# Patient Record
Sex: Female | Born: 1985 | Race: Black or African American | Hispanic: No | Marital: Single | Smoking: Current every day smoker
Health system: Southern US, Community
[De-identification: ages and names within clinical notes are randomized; demographics above are authoritative.]

## PROBLEM LIST (undated history)

## (undated) ENCOUNTER — Inpatient Hospital Stay (HOSPITAL_COMMUNITY): Payer: Self-pay

## (undated) DIAGNOSIS — G5601 Carpal tunnel syndrome, right upper limb: Secondary | ICD-10-CM

## (undated) DIAGNOSIS — I10 Essential (primary) hypertension: Secondary | ICD-10-CM

## (undated) DIAGNOSIS — E119 Type 2 diabetes mellitus without complications: Secondary | ICD-10-CM

## (undated) DIAGNOSIS — E282 Polycystic ovarian syndrome: Secondary | ICD-10-CM

## (undated) HISTORY — PX: NO PAST SURGERIES: SHX2092

## (undated) HISTORY — DX: Polycystic ovarian syndrome: E28.2

---

## 2009-06-27 LAB — CONVERTED CEMR LAB

## 2010-01-04 ENCOUNTER — Emergency Department (HOSPITAL_COMMUNITY): Admission: EM | Admit: 2010-01-04 | Discharge: 2010-01-04 | Payer: Self-pay | Admitting: Emergency Medicine

## 2010-02-22 ENCOUNTER — Ambulatory Visit: Payer: Self-pay | Admitting: Internal Medicine

## 2010-02-22 DIAGNOSIS — G43909 Migraine, unspecified, not intractable, without status migrainosus: Secondary | ICD-10-CM | POA: Insufficient documentation

## 2010-02-22 DIAGNOSIS — I10 Essential (primary) hypertension: Secondary | ICD-10-CM | POA: Insufficient documentation

## 2010-02-22 DIAGNOSIS — G8929 Other chronic pain: Secondary | ICD-10-CM | POA: Insufficient documentation

## 2010-02-22 DIAGNOSIS — J410 Simple chronic bronchitis: Secondary | ICD-10-CM | POA: Insufficient documentation

## 2010-02-22 DIAGNOSIS — M549 Dorsalgia, unspecified: Secondary | ICD-10-CM

## 2010-02-23 LAB — CONVERTED CEMR LAB
ALT: 21 units/L (ref 0–35)
AST: 22 units/L (ref 0–37)
Albumin: 3.5 g/dL (ref 3.5–5.2)
Alkaline Phosphatase: 44 units/L (ref 39–117)
BUN: 13 mg/dL (ref 6–23)
Basophils Absolute: 0 10*3/uL (ref 0.0–0.1)
Basophils Relative: 0.3 % (ref 0.0–3.0)
Bilirubin Urine: NEGATIVE
Bilirubin, Direct: 0.1 mg/dL (ref 0.0–0.3)
CO2: 25 meq/L (ref 19–32)
Calcium: 8.9 mg/dL (ref 8.4–10.5)
Chloride: 108 meq/L (ref 96–112)
Cholesterol: 105 mg/dL (ref 0–200)
Creatinine, Ser: 0.7 mg/dL (ref 0.4–1.2)
Eosinophils Absolute: 0.1 10*3/uL (ref 0.0–0.7)
Eosinophils Relative: 1.6 % (ref 0.0–5.0)
GFR calc non Af Amer: 126.53 mL/min (ref >60)
Glucose, Bld: 78 mg/dL (ref 70–99)
HCT: 39 % (ref 36.0–46.0)
HDL: 42.6 mg/dL (ref >39.00)
Hemoglobin, Urine: NEGATIVE
Hemoglobin: 13.7 g/dL (ref 12.0–15.0)
Ketones, ur: NEGATIVE mg/dL
LDL Cholesterol: 47 mg/dL (ref 0–99)
Lymphocytes Relative: 23.6 % (ref 12.0–46.0)
Lymphs Abs: 1.5 10*3/uL (ref 0.7–4.0)
MCHC: 35.2 g/dL (ref 30.0–36.0)
MCV: 91.6 fL (ref 78.0–100.0)
Monocytes Absolute: 0.3 10*3/uL (ref 0.1–1.0)
Monocytes Relative: 5.1 % (ref 3.0–12.0)
Neutro Abs: 4.5 10*3/uL (ref 1.4–7.7)
Neutrophils Relative %: 69.4 % (ref 43.0–77.0)
Nitrite: NEGATIVE
Platelets: 238 10*3/uL (ref 150.0–400.0)
Potassium: 4.3 meq/L (ref 3.5–5.1)
RBC: 4.25 M/uL (ref 3.87–5.11)
RDW: 12.9 % (ref 11.5–14.6)
Sodium: 139 meq/L (ref 135–145)
Specific Gravity, Urine: 1.025 (ref 1.000–1.030)
TSH: 1.71 microintl units/mL (ref 0.35–5.50)
Total Bilirubin: 0.4 mg/dL (ref 0.3–1.2)
Total CHOL/HDL Ratio: 2
Total Protein, Urine: NEGATIVE mg/dL
Total Protein: 6.9 g/dL (ref 6.0–8.3)
Triglycerides: 79 mg/dL (ref 0.0–149.0)
Urine Glucose: NEGATIVE mg/dL
Urobilinogen, UA: 0.2 (ref 0.0–1.0)
VLDL: 15.8 mg/dL (ref 0.0–40.0)
WBC: 6.4 10*3/uL (ref 4.5–10.5)
pH: 6 (ref 5.0–8.0)

## 2010-03-01 ENCOUNTER — Encounter: Admission: RE | Admit: 2010-03-01 | Discharge: 2010-03-01 | Payer: Self-pay | Admitting: Internal Medicine

## 2010-05-29 ENCOUNTER — Ambulatory Visit
Admission: RE | Admit: 2010-05-29 | Discharge: 2010-05-29 | Payer: Self-pay | Source: Home / Self Care | Attending: Internal Medicine | Admitting: Internal Medicine

## 2010-05-29 DIAGNOSIS — G4700 Insomnia, unspecified: Secondary | ICD-10-CM | POA: Insufficient documentation

## 2010-06-26 NOTE — Assessment & Plan Note (Signed)
Summary: NEW [employer] PT--#--PKG---STC   Vital Signs:  Patient profile:   25 year old female Height:      61.5 inches (156.21 cm) Weight:      215.12 pounds (97.78 kg) BMI:     40.13 O2 Sat:      97 % on Room air Temp:     98.7 degrees F (37.06 degrees C) oral Pulse rate:   73 / minute BP sitting:   122 / 82  (left arm) Cuff size:   large  Vitals Entered By: Orlan Leavens RMA (February 22, 2010 8:07 AM)  O2 Flow:  Room air CC: New patient Is Patient Diabetic? No Pain Assessment Patient in pain? no        Primary Care Provider:  Newt Lukes MD  CC:  New patient.  History of Present Illness: new pt to me and our practice, here to est care - also patient is here today for annual physical. Patient feels well overall today, fasting -  c/o occassional RLQ pain - episodic pains - occur every 2-4 Anesia Blackwell, lasts 5-79min per episode - radiates from RLQ/pelvic area to LLQ - not a/w bowel movement or meals - no fever - no change in menses - last BCP change 06/2009 no n/v or change in BMs - formed BM 2-4/day (usual) no weight loss, + weight gain over past 2 years  also c/o back spasm - worse in bed when turning over in sleep - also exac by prolonged standing or twisting to reach behind no injury or trauma -   Preventive Screening-Counseling & Management  Alcohol-Tobacco     Alcohol drinks/day: <1     Alcohol Counseling: not indicated; use of alcohol is not excessive or problematic     Smoking Status: current     Smoking Cessation Counseling: yes     Cigars/week: black and mild     Tobacco Counseling: to quit use of tobacco products  Caffeine-Diet-Exercise     Diet Counseling: to improve diet; diet is suboptimal     Does Patient Exercise: no     Exercise Counseling: to improve exercise regimen     Depression Counseling: not indicated; screening negative for depression  Safety-Violence-Falls     Seat Belt Counseling: not indicated; patient wears seat belts  Helmet Counseling: not applicable     Firearm Counseling: not applicable     Violence Counseling: not indicated; no violence risk noted     Fall Risk Counseling: not indicated; no significant falls noted  Clinical Review Panels:  Prevention   Last Pap Smear:  Interpretation Result:Negative for intraepithelial Lesion or Malignancy.    (06/27/2009)  Immunizations   Last Tetanus Booster:  Historical (05/27/2006)   Current Medications (verified): 1)  Tri-Norinyl (28) 0.5/1/0.5-35 Mg-Mcg Tabs (Norethin-Eth Estrad Triphasic) .... Take 1 By Mouth Once Daily 2)  Thermo-Bond (Ovc) .... Take As Needed 3)  Cell Activator (Ovc) .... Take 1 Three Times A Day 4)  Multivitamins  Tabs (Multiple Vitamin) .... Take 1 Three Times A Day  Allergies (verified): No Known Drug Allergies  Past History:  Past Medical History: Depression, mild - never on med tx Hypertension, never on med tx PCOS, never on med tx carpal tunnel syndome, R>L  MD roster: gyn - health dept  Past Surgical History: Denies surgical history  Family History: Family History of Alcoholism/Addiction (mom) Family History Diabetes 1st degree relative (other realtive) Family History Hypertension (other relative) Stroke (grandparent)  Social History: Current Smoker - black and mild  cigars social alcoholuse born [place], raised by foster family in [place] in [place] since 2007 - undergrad at [employer],  now grad school student - MSW single, lives wityh boyfirned and roommate Smoking Status:  current Does Patient Exercise:  no  Review of Systems  The patient denies anorexia, fever, weight loss, decreased hearing, hoarseness, chest pain, syncope, dyspnea on exertion, peripheral edema, hemoptysis, melena, hematochezia, severe indigestion/heartburn, muscle weakness, suspicious skin lesions, difficulty walking, abnormal bleeding, and enlarged lymph nodes.         also, see HPI above. I have reviewed all other systems and they  were negative.   Physical Exam  General:  overweight-appearing.  alert, well-developed, well-nourished, and cooperative to examination.    Head:  Normocephalic and atraumatic without obvious abnormalities. No apparent alopecia or balding. Eyes:  vision grossly intact; pupils equal, round and reactive to light.  conjunctiva and lids normal.    Ears:  normal pinnae bilaterally, without erythema, swelling, or tenderness to palpation. TMs clear, without effusion, or cerumen impaction. Hearing grossly normal bilaterally  Mouth:  teeth and gums in good repair; mucous membranes moist, without lesions or ulcers. oropharynx clear without exudate, no erythema.  Neck:  thick, supple, full ROM, no masses, no thyromegaly; no thyroid nodules or tenderness. no JVD or carotid bruits.   Lungs:  normal respiratory effort, no intercostal retractions or use of accessory muscles; normal breath sounds bilaterally - no crackles and no wheezes.    Heart:  normal rate, regular rhythm, no murmur, and no rub. BLE without edema. normal DP pulses and normal cap refill in all 4 extremities    Abdomen:  protuberant, soft, non-tender, normal bowel sounds, no distention; no masses and no appreciable hepatomegaly or splenomegaly.   Genitalia:  defer to gyn Msk:  No deformity or scoliosis noted of thoracic or lumbar spine.  +myofascial spasm lumbar paraspnal region r>l Neurologic:  alert & oriented X3 and cranial nerves II-XII symetrically intact.  strength normal in all extremities, sensation intact to light touch, and gait normal. speech fluent without dysarthria or aphasia; follows commands with good comprehension.  Skin:  no rashes, vesicles, ulcers, or erythema. No nodules or irregularity to palpation.  Cervical Nodes:  No lymphadenopathy noted Axillary Nodes:  No palpable lymphadenopathy Inguinal Nodes:  No significant adenopathy Psych:  Oriented X3, memory intact for recent and remote, normally interactive, good eye  contact, not anxious appearing, not depressed appearing, and not agitated.      Impression & Recommendations:  Problem # 1:  PREVENTIVE HEALTH CARE (ICD-V70.0) Patient has been counseled on age-appropriate routine health concerns for screening and prevention. These are reviewed and up-to-date. Immunizations are up-to-date or declined. Labs ordered and will be reviewed.  Orders: TLB-BMP (Basic Metabolic Panel-BMET) (80048-METABOL) TLB-CBC Platelet - w/Differential (85025-CBCD) TLB-Hepatic/Liver Function Pnl (80076-HEPATIC) TLB-Lipid Panel (80061-LIPID) TLB-TSH (Thyroid Stimulating Hormone) (84443-TSH) TLB-Udip w/ Micro (81001-URINE)  Problem # 2:  MUSCLE SPASM, BACK (ICD-724.8)  suspect spasm causing positional pain - muscle relaxants erx done - weight loss and exercise rec - consider refer to PT Her updated medication list for this problem includes:    Robaxin 500 Mg Tabs (Methocarbamol) .Marland Kitchen... 1 by mouth at bedtime and every 8 hours as needed for muscle spasm and pain  Discussed use of moist heat or ice, modified activities, medications, and stretching/strengthening exercises. Back care instructions given. To be seen in 2 Ivanell Deshotel if no improvement; sooner if worsening of symptoms.   Orders: Prescription Created Electronically [phone])  Problem # 3:  ABDOMINAL PAIN, RIGHT LOWER QUADRANT (ICD-789.03)  exam benign - ?related to ovarian problem - check pelvic US - f/u here after complete abnd labs reviewed - no abn on exam or GI hx of concern Orders: Radiology Referral (Radiology)  Discussed symptom control with the patient.   Problem # 4:  OVERWEIGHT (ICD-278.02)  Ht: 61.5 (02/22/2010)   Wt: 215.12 (02/22/2010)   BMI: 40.13 (02/22/2010)  Problem # 5:  SMOKER (ICD-305.1) 5 minutes today spent on patient education regarding the unhealthy effects of continued tobacco abuse and encouragment of cessation including medical options available to help patient to quit smoking.   Complete  Medication List: 1)  Tri-norinyl (28) 0.5/1/0.5-35 Mg-mcg Tabs (Norethin-eth estrad triphasic) .... Take 1 by mouth once daily 2)  Thermo-bond (ovc)  .... Take as needed 3)  Cell Activator (ovc)  .... Take 1 three times a day 4)  Multivitamins Tabs (Multiple vitamin) .... Take 1 three times a day 5)  Robaxin 500 Mg Tabs (Methocarbamol) .Marland Kitchen.. 1 by mouth at bedtime and every 8 hours as needed for muscle spasm and pain  Patient Instructions: 1)  it was good to see you today. 2)  exam appears normal today - 3)  it is important that you work on losing weight - monitor your diet and consume fewer calories such as less carbohydrates (sugar) and less fat. you also need to increase your physical activity level - start by walking for 10-20 minutes 3 times per week and work up to 30 minutes 4-5 times each week.  4)  test(s) ordered today - your results will be posted on the phone tree for review in 48-72 hours from the time of test completion; call [phone] and enter your 9 digit MRN (listed above on this page, just below your name); if any changes need to be made or there are abnormal results, you will be contacted directly. 5)  we'll make referral for pelvic ultrasound to look for causes of lower abdominal and pelvic pain. Our office will contact you regarding this appointment once made.  6)  use robaxin for muscle spasm and pain as dicsussed - at bedtime and every 8 hours as needed  - your prescription has been electronically submitted to your pharmacy. Please take as directed. Contact our office if you believe you're having problems with the medication(s).  7)  Please schedule a follow-up appointment in 2-4 Belynda Pagaduan to review results and symptoms, call sooner if problems.  8)  Tobacco is very bad for your health and your loved ones! You Should stop smoking! Prescriptions: ROBAXIN 500 MG TABS (METHOCARBAMOL) 1 by mouth at bedtime and every 8 hours as needed for muscle spasm and pain  #40 x 0   Entered and  Authorized by:   Newt Lukes MD   Signed by:   Newt Lukes MD on 02/22/2010   Method used:   Electronically to        [facility]* (retail)       [address] [place], [place]  [address]       Ph: [phone]       Fax: [phone]   RxID:   [id number]    Pap Smear  Procedure date:  06/27/2009  Findings:      Interpretation Result:Negative for intraepithelial Lesion or Malignancy.       Immunization History:  Tetanus/Td Immunization History:    Tetanus/Td:  historical (  05/27/2006)  

## 2010-06-28 NOTE — Assessment & Plan Note (Signed)
Summary: cant sleep/ NEEDS FOR WORK/ PAPERWORK/ TB INJ /NWS  #   Vital Signs:  Patient profile:   25 year old female Height:      61.5 inches (156.21 cm) Weight:      219.12 pounds (99.60 kg) O2 Sat:      98 % on Room air Temp:     98.7 degrees F (37.06 degrees C) oral Pulse rate:   95 / minute BP sitting:   128 / 82  (left arm) Cuff size:   large  Vitals Entered By: Orlan Leavens RMA (May 29, 2010 10:49 AM)  O2 Flow:  Room air CC: paperwork, can't sleep Is Patient Diabetic? No Pain Assessment Patient in pain? no      Comments Pt had CPX doen back in Sept, Requesting form to be fill-out, also needing TB skin test, cant sleep   Primary Care Provider:  Newt Lukes MD  CC:  paperwork and can't sleep.  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 (to do home visits for at risk/ viloent kids)- need for PPD to documetn same-  c/o insomnia acute on chronic issue worse in past 6 weeks requests something to help sleep -  otc meds ineffective, prev used ambein as teenager with good results denies assoc depression symptoms   Preventive Screening-Counseling & Management  Alcohol-Tobacco     Alcohol drinks/day: <1     Alcohol Counseling: not indicated; use of alcohol is not excessive or problematic     Smoking Status: current     Smoking Cessation Counseling: yes     Cigars/week: black and mild     Tobacco Counseling: to quit use of tobacco products  Caffeine-Diet-Exercise     Diet Counseling: to improve diet; diet is suboptimal     Does Patient Exercise: no     Exercise Counseling: to improve exercise regimen     Depression Counseling: not indicated; screening negative for depression  Safety-Violence-Falls     Seat Belt Counseling: not indicated; patient wears seat belts     Helmet Counseling: not applicable     Firearm Counseling: not applicable     Violence Counseling: not  indicated; no violence risk noted     Fall Risk Counseling: not indicated; no significant falls noted  Clinical Review Panels:  Prevention   Last Pap Smear:  Interpretation Result:Negative for intraepithelial Lesion or Malignancy.    (06/27/2009)  Immunizations   Last Tetanus Booster:  Historical (05/27/2006)   Last PPD Date Read:  05/31/2010 (05/29/2010)   Last PPD Result:  negative (05/29/2010)  Lipid Management   Cholesterol:  105 (02/22/2010)   LDL (bad choesterol):  47 (02/22/2010)   HDL (good cholesterol):  42.60 (02/22/2010)  CBC   WBC:  6.4 (02/22/2010)   RBC:  4.25 (02/22/2010)   Hgb:  13.7 (02/22/2010)   Hct:  39.0 (02/22/2010)   Platelets:  238.0 (02/22/2010)   MCV  91.6 (02/22/2010)   MCHC  35.2 (02/22/2010)   RDW  12.9 (02/22/2010)   PMN:  69.4 (02/22/2010)   Lymphs:  23.6 (02/22/2010)   Monos:  5.1 (02/22/2010)   Eosinophils:  1.6 (02/22/2010)   Basophil:  0.3 (02/22/2010)  Complete Metabolic Panel   Glucose:  78 (02/22/2010)   Sodium:  139 (02/22/2010)   Potassium:  4.3 (02/22/2010)   Chloride:  108 (02/22/2010)   CO2:  25 (02/22/2010)   BUN:  13 (02/22/2010)  Creatinine:  0.7 (02/22/2010)   Albumin:  3.5 (02/22/2010)   Total Protein:  6.9 (02/22/2010)   Calcium:  8.9 (02/22/2010)   Total Bili:  0.4 (02/22/2010)   Alk Phos:  44 (02/22/2010)   SGPT (ALT):  21 (02/22/2010)   SGOT (AST):  22 (02/22/2010)   Current Medications (verified): 1)  Tri-Norinyl (28) 0.5/1/0.5-35 Mg-Mcg Tabs (Norethin-Eth Estrad Triphasic) .... Take 1 By Mouth Once Daily 2)  Thermo-Bond (Ovc) .... Take As Needed 3)  Cell Activator (Ovc) .... Take 1 Three Times A Day 4)  Multivitamins  Tabs (Multiple Vitamin) .... Take 1 Three Times A Day 5)  Robaxin 500 Mg Tabs (Methocarbamol) .Marland Kitchen.. 1 By Mouth At Bedtime and Every 8 Hours As Needed For Muscle Spasm and Pain  Allergies (verified): No Known Drug Allergies  Past History:  Past medical, surgical, family and social  histories (including risk factors) reviewed, and no changes noted (except as noted below).  Past Medical History: Reviewed history from 02/22/2010 and no changes required. Depression, mild - never on med tx Hypertension, never on med tx PCOS, never on med tx carpal tunnel syndome, R>L  MD roster: gyn - health dept  Past Surgical History: Reviewed history from 02/22/2010 and no changes required. Denies surgical history  Family History: Reviewed history from 02/22/2010 and no changes required. Family History of Alcoholism/Addiction (mom) Family History Diabetes 1st degree relative (other realtive) Family History Hypertension (other relative) Stroke (grandparent)  Social History: Reviewed history from 02/22/2010 and no changes required. 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 I have reviewed all other systems and they were negative.   Physical Exam  General:  overweight-appearing.  alert, well-developed, well-nourished, and cooperative to examination.    Head:  Normocephalic and atraumatic without obvious abnormalities. No apparent alopecia or balding. Eyes:  vision grossly intact; pupils equal, round and reactive to light.  conjunctiva and lids normal.    Ears:  normal pinnae bilaterally, without erythema, swelling, or tenderness to palpation. TMs clear, without effusion, or cerumen impaction. Hearing grossly normal bilaterally  Mouth:  teeth and gums in good repair; mucous membranes moist, without lesions or ulcers. oropharynx clear without exudate, no erythema.  Neck:  thick, supple, full ROM, no masses, no thyromegaly; no thyroid nodules or tenderness. no JVD or carotid bruits.   Lungs:  normal respiratory effort, no intercostal retractions or use of  accessory muscles; normal breath sounds bilaterally - no crackles and no wheezes.    Heart:  normal rate, regular rhythm, no murmur, and no rub. BLE without edema. normal DP pulses and normal cap refill in all 4 extremities    Abdomen:  protuberant, soft, non-tender, normal bowel sounds, no distention; no masses and no appreciable hepatomegaly or splenomegaly.   Genitalia:  defer to gyn Msk:  No deformity or scoliosis noted of thoracic or lumbar spine.  Neurologic:  alert & oriented X3 and cranial nerves II-XII symetrically intact.  strength normal in all extremities, sensation intact to light touch, and gait normal. speech fluent without dysarthria or aphasia; follows commands with good comprehension.  Skin:  no rashes, vesicles, ulcers, or erythema. No nodules or irregularity to palpation.  Psych:  Oriented X3, memory intact for recent and remote, normally interactive, good eye  contact, not anxious appearing, not depressed appearing, and not agitated.      Impression & Recommendations:  Problem # 1:  OTH GENERAL MEDICAL EXAMINATION ADMIN PURPOSES (ICD-V70.3) Patient has been counseled on age-appropriate routine health concerns for screening and prevention. These are reviewed and up-to-date. Immunizations are up-to-date or declined. Labs 01/2010 reviewed. PPD today - form completed for pt to work with SW home programs as part of education process  Problem # 2:  INSOMNIA (ICD-780.52) rxd done today - advised to avoid nightly consistent use - only as needed for best effect - pt understands and agrees to same Her updated medication list for this problem includes:    Zolpidem Tartrate 10 Mg Tabs (Zolpidem tartrate) .Marland Kitchen... 1/2-1 by mouth at bedtime as needed for sleep  Complete Medication List: 1)  Tri-norinyl (28) 0.5/1/0.5-35 Mg-mcg Tabs (Norethin-eth estrad triphasic) .... Take 1 by mouth once daily 2)  Thermo-bond (ovc)  .... Take as needed 3)  Cell Activator (ovc)  .... Take 1 three times a  day 4)  Multivitamins Tabs (Multiple vitamin) .... Take 1 three times a day 5)  Robaxin 500 Mg Tabs (Methocarbamol) .Marland Kitchen.. 1 by mouth at bedtime and every 8 hours as needed for muscle spasm and pain 6)  Zolpidem Tartrate 10 Mg Tabs (Zolpidem tartrate) .... 1/2-1 by mouth at bedtime as needed for sleep  Other Orders: TB Skin Test [phone]) Admin 1st Vaccine (98119)  Patient Instructions: 1)  it was good to see you today.  2)  return for PPD skin reading by nurse on Wed 05/31/10 3)  start generic ambien as needed for sleep - your prescription has been faxed to your pharmacy. Please take as directed. Contact our office if you believe you're having problems with the medication(s).  4)  exam  normal today - paperwork completed as requested and will be returned to you after skin reading Wed 05/31/10 5)  it is important that you continue to work on losing weight - monitor your diet and consume fewer calories such as less carbohydrates (sugar) and less fat. you also need to increase your physical activity level - start by walking for 10-20 minutes 3 times per week and work up to 30 minutes 4-5 times each week.  6)  use robaxin for muscle spasm and pain as dicsussed - at bedtime and every 8 hours as needed  - your prescription has been electronically submitted to your pharmacy. Please take as directed. Contact our office if you believe you're having problems with the medication(s).  7)  Tobacco is very bad for your health and your loved ones! You Should stop smoking! 8)  Please schedule a follow-up appointment as needed. Prescriptions: ZOLPIDEM TARTRATE 10 MG TABS (ZOLPIDEM TARTRATE) 1/2-1 by mouth at bedtime as needed for sleep  #30 x 1   Entered and Authorized by:   Newt Lukes MD   Signed by:   Newt Lukes MD on 05/29/2010   Method used:   Printed then faxed to ...       [facility]* (retail)       [address] [place], [place]  [address]       Ph:  [phone]       Fax: [phone]   RxID:   [id number]    Orders Added: 1)  Est. Patient Level IV [64403] 2)  TB Skin Test [86580] 3)  Admin 1st Vaccine [  90471]   Immunizations Administered:  PPD Skin Test:    Vaccine Type: PPD    Site: left forearm    Mfr: Sanofi Pasteur    Dose: 0.1 ml    Route: ID    Given by: Orlan Leavens RMA    Exp. Date: 10/22/2011    Lot #: [id number]  PPD Results    Date of reading: 05/31/2010    Results: < 5mm    Interpretation: negative   Immunizations Administered:  PPD Skin Test:    Vaccine Type: PPD    Site: left forearm    Mfr: Sanofi Pasteur    Dose: 0.1 ml    Route: ID    Given by: Orlan Leavens RMA    Exp. Date: 10/22/2011    Lot #: [id number]

## 2011-06-06 ENCOUNTER — Telehealth: Payer: Self-pay | Admitting: Internal Medicine

## 2011-06-06 NOTE — Telephone Encounter (Signed)
I do not have clinic this afternoon and did not see the message for now. Please offer her an appointment for tomorrow. Thanks

## 2011-06-06 NOTE — Telephone Encounter (Signed)
The pt called and is hoping a same day appt for cold symptoms.  Can she be worked in?   Thanks!

## 2011-06-07 ENCOUNTER — Encounter: Payer: Self-pay | Admitting: Internal Medicine

## 2011-06-07 ENCOUNTER — Ambulatory Visit (INDEPENDENT_AMBULATORY_CARE_PROVIDER_SITE_OTHER): Payer: BC Managed Care – PPO | Admitting: Internal Medicine

## 2011-06-07 VITALS — BP 110/72 | HR 80 | Temp 99.1°F | Ht 64.0 in | Wt 223.2 lb

## 2011-06-07 DIAGNOSIS — F17200 Nicotine dependence, unspecified, uncomplicated: Secondary | ICD-10-CM

## 2011-06-07 DIAGNOSIS — J069 Acute upper respiratory infection, unspecified: Secondary | ICD-10-CM

## 2011-06-07 DIAGNOSIS — J209 Acute bronchitis, unspecified: Secondary | ICD-10-CM

## 2011-06-07 DIAGNOSIS — G4700 Insomnia, unspecified: Secondary | ICD-10-CM

## 2011-06-07 MED ORDER — AMOXICILLIN-POT CLAVULANATE 875-125 MG PO TABS: 1.0000 | ORAL_TABLET | Freq: Two times a day (BID) | ORAL | Status: AC

## 2011-06-07 MED ORDER — ZOLPIDEM TARTRATE 5 MG PO TABS: 5.0000 mg | ORAL_TABLET | Freq: Every evening | ORAL | Status: DC | PRN

## 2011-06-07 NOTE — Assessment & Plan Note (Signed)
Uses generic Ambien as needed - refill provided today

## 2011-06-07 NOTE — Patient Instructions (Signed)
It was good to see you today. Augmentin antibiotics - Your prescription(s) have been submitted to your pharmacy. Please take as directed and contact our office if you believe you are having problem(s) with the medication(s). Alternate between ibuprofen and tylenol for aches, pain and fever symptoms as discussed Continue Mucinex as ongoing, warm salt water gargles for throat and consider nasal saline irrigation if needed Keep hydrated, rest and call us if symptoms worse or unimproved Refill on the generic Ambien provided as requested Consider smoking cessation as we discussed. Smoking is very bad for your health!

## 2011-06-07 NOTE — Progress Notes (Signed)
  Subjective:   HPI  complains of cold symptoms  Onset >1 week ago, wax/wane symptoms  associated with sore throat, fatigue, rhinorrhea, mild headache and low grade fever Progression to myalgias, sinus pressure and mild-mod chest congestion No relief with OTC meds Precipitated by sick contacts  Past Medical History  Diagnosis Date  . Depression     mild, never on med tx  . PCOS (polycystic ovarian syndrome)     never on med  . CTS (carpal tunnel syndrome)     Right and left    Review of Systems Constitutional: No night sweats, no unexpected weight change Pulmonary: No pleurisy or hemoptysis Cardiovascular: No chest pain or palpitations     Objective:   Physical Exam BP 110/72  Pulse 80  Temp(Src) 99.1 F (37.3 C) (Oral)  Ht 5\' 4"  (1.626 m)  Wt 223 lb 3.2 oz (101.243 kg)  BMI 38.31 kg/m2  SpO2 97% GEN: mildly ill appearing and audible head/chest congestion HENT: NCAT, mild sinus tenderness bilaterally, nares with clear discharge, oropharynx mild erythema, no exudate Eyes: Vision grossly intact, no conjunctivitis Lungs: bilateral scattered rhonchi, no wheeze, no increased work of breathing Cardiovascular: Regular rate and rhythm, no bilateral edema      Assessment & Plan:  Viral URI >> progression to sinusitis/bronchitis Cough related to above Tobacco abuse   Empiric antibiotics prescribed due to symptom duration greater than 7 days Symptomatic care with Tylenol or Advil, hydration and rest -  salt gargle advised as needed Smoking Cessation advised

## 2011-08-13 ENCOUNTER — Other Ambulatory Visit: Payer: Self-pay

## 2011-08-13 ENCOUNTER — Ambulatory Visit (INDEPENDENT_AMBULATORY_CARE_PROVIDER_SITE_OTHER): Payer: BC Managed Care – PPO | Admitting: Internal Medicine

## 2011-08-13 ENCOUNTER — Encounter: Payer: Self-pay | Admitting: Internal Medicine

## 2011-08-13 VITALS — BP 120/72 | HR 102 | Temp 98.6°F | Ht 62.0 in | Wt 223.1 lb

## 2011-08-13 DIAGNOSIS — M5380 Other specified dorsopathies, site unspecified: Secondary | ICD-10-CM

## 2011-08-13 DIAGNOSIS — G4700 Insomnia, unspecified: Secondary | ICD-10-CM

## 2011-08-13 DIAGNOSIS — M539 Dorsopathy, unspecified: Secondary | ICD-10-CM

## 2011-08-13 MED ORDER — ZOLPIDEM TARTRATE ER 6.25 MG PO TBCR: 6.2500 mg | EXTENDED_RELEASE_TABLET | Freq: Every evening | ORAL | Status: DC | PRN

## 2011-08-13 MED ORDER — CYCLOBENZAPRINE HCL 5 MG PO TABS: 5.0000 mg | ORAL_TABLET | Freq: Three times a day (TID) | ORAL | Status: DC | PRN

## 2011-08-13 NOTE — Telephone Encounter (Signed)
Pt advised via VM of Rx/pharmacy 

## 2011-08-13 NOTE — Telephone Encounter (Signed)
Pt called c/o back spasms several times a day for the last week. Pt is requesting Rx for a muscle relaxer, please advise.

## 2011-08-13 NOTE — Telephone Encounter (Signed)
Flexeril prn - erx done - OV if worse or unimproved

## 2011-08-13 NOTE — Progress Notes (Signed)
  Subjective:    Patient ID: Deanna Richards, female    DOB: 07-20-1985, 26 y.o.   MRN: [id number]  Back Pain This is a new problem. The current episode started in the past 7 days. The problem occurs intermittently. The problem has been waxing and waning since onset. The pain is present in the lumbar spine. The quality of the pain is described as aching. The pain does not radiate. The pain is mild. The pain is worse during the night. The symptoms are aggravated by lying down and twisting. Pertinent negatives include no abdominal pain, bladder incontinence, headaches, leg pain, tingling or weakness. Risk factors include poor posture and obesity. She has tried chiropractic manipulation and heat for the symptoms. The treatment provided mild relief.   Past Medical History  Diagnosis Date  . Depression     mild, never on med tx  . PCOS (polycystic ovarian syndrome)     never on med  . CTS (carpal tunnel syndrome)     Right and left     Review of Systems  Gastrointestinal: Negative for abdominal pain.  Genitourinary: Negative for bladder incontinence.  Musculoskeletal: Positive for back pain. Negative for joint swelling and gait problem.  Neurological: Negative for dizziness, tingling, weakness and headaches.       Objective:   Physical Exam BP 120/72  Pulse 102  Temp(Src) 98.6 F (37 C) (Oral)  Ht 5\' 2"  (1.575 m)  Wt 223 lb 1.9 oz (101.207 kg)  BMI 40.81 kg/m2  SpO2 98% Wt Readings from Last 3 Encounters:  08/13/11 223 lb 1.9 oz (101.207 kg)  06/07/11 223 lb 3.2 oz (101.243 kg)  05/29/10 219 lb 1.9 oz (99.392 kg)   Constitutional: She is overweight, appears well-developed and well-nourished. No distress.  Cardiovascular: Normal rate, regular rhythm and normal heart sounds.  No murmur heard. No BLE edema. Pulmonary/Chest: Effort normal and breath sounds normal. No respiratory distress. She has no wheezes.  Abdominal: Soft. Bowel sounds are normal. She exhibits no distension. There  is no tenderness. no masses Musculoskeletal: Back: full range of motion of thoracic and lumbar spine. Non tender to palpation. Negative straight leg raise. DTR's are symmetrically intact. Sensation intact in all dermatomes of the lower extremities. Full strength to manual muscle testing. patient is able to heel toe walk without difficulty and ambulates with antalgic gait.  Skin: Skin is warm and dry. No rash noted. No erythema.  Psychiatric: She has a normal mood and affect. Her behavior is normal. Judgment and thought content normal.   Lab Results  Component Value Date   WBC 6.4 02/22/2010   HGB 13.7 02/22/2010   HCT 39.0 02/22/2010   PLT 238.0 02/22/2010   GLUCOSE 78 02/22/2010   CHOL 105 02/22/2010   TRIG 79.0 02/22/2010   HDL 42.60 02/22/2010   LDLCALC 47 02/22/2010   ALT 21 02/22/2010   AST 22 02/22/2010   NA 139 02/22/2010   K 4.3 02/22/2010   CL 108 02/22/2010   CREATININE 0.7 02/22/2010   BUN 13 02/22/2010   CO2 25 02/22/2010   TSH 1.71 02/22/2010       Assessment & Plan:   See problem list. Medications and labs reviewed today.

## 2011-08-13 NOTE — Assessment & Plan Note (Signed)
Uses generic Ambien as needed, but only lasting 4h or less Requests change to XR - new rx done today Also discussed sleep hygiene and stress mgmt (as with back spasm approve)

## 2011-08-13 NOTE — Patient Instructions (Signed)
It was good to see you today. Use Flexeril as needed for muscle spasm, especially at night Change Ambien to the extended release Your prescription(s) have been submitted to your pharmacy. Please take as directed and contact our office if you believe you are having problem(s) with the medication(s). Focus on healthy back habits and good sleep hygiene as discussed Back Injury Prevention How you use your back is important in preventing back injury. HEALTHY BACK IDEAS  Keep your back aligned and maintain good posture.   Sleep on a firm mattress. Two positions of sleep keep your back aligned properly:   Sleep on your side with your knees slightly bent.   Sleep on your back with a pillow under your knees.   Avoid sitting in 1 position for a long time. Get up and move around every hour.   Use massage if you feel tense in your shoulders, neck, or back.   Stretch before you work or exercise.  LIFTING  Avoid lifting heavy objects. Make several small trips rather than carrying 1 heavy load.   Do not let your back serve as a lever to lift objects.   Lift objects close to your body to prevent leaning forward.   Bend at your knees when you pick things up. Do not bend at your waist.   Try not to lift heavy things above your waist.   Try not to reach for things above your head in a manner that might disturb your back alignment.   Do not turn or twist while holding an object. Turn your feet, not your back.   Avoid sudden movements.   Get help to move an object when needed.   Use carts or dollies to move heavier objects whenever possible.  WORKING AT A DESK  Use a chair with good lower back support.   Sit close to your desk so you do not need to lean over.   Keep your chin tucked in to keep your back aligned.  IN A CAR  Avoid prolonged drives without enough breaks.   Sit comfortably with your arms slightly flexed.  EXERCISE Strengthening exercises decrease the risk of back  injuries. Be sure to stretch before and after exercise. Ask your doctor for a list of exercises to strengthen your back and belly (abdomen). Stay at a healthy body weight. MAKE SURE YOU:    Understand these instructions.   Will watch your condition.   Will get help right away if you are not doing well or get worse.  Document Released: 10/30/2007 Document Revised: 05/02/2011 Document Reviewed: 06/22/2009 [facility] Patient Information 2012 [place], [place].  Insomnia Insomnia is frequent trouble falling and/or staying asleep. Insomnia can be a long term problem or a short term problem. Both are common. Insomnia can be a short term problem when the wakefulness is related to a certain stress or worry. Long term insomnia is often related to ongoing stress during waking hours and/or poor sleeping habits. Overtime, sleep deprivation itself can make the problem worse. Every little thing feels more severe because you are overtired and your ability to cope is decreased. CAUSES    Stress, anxiety, and depression.   Poor sleeping habits.   Distractions such as TV in the bedroom.   Naps close to bedtime.   Engaging in emotionally charged conversations before bed.   Technical reading before sleep.   Alcohol and other sedatives. They may make the problem worse. They can hurt normal sleep patterns and normal dream activity.  Stimulants such as caffeine for several hours prior to bedtime.   Pain syndromes and shortness of breath can cause insomnia.   Exercise late at night.   Changing time zones may cause sleeping problems (jet lag).  It is sometimes helpful to have someone observe your sleeping patterns. They should look for periods of not breathing during the night (sleep apnea). They should also look to see how long those periods last. If you live alone or observers are uncertain, you can also be observed at a sleep clinic where your sleep patterns will be professionally monitored. Sleep apnea  requires a checkup and treatment. Give your caregivers your medical history. Give your caregivers observations your family has made about your sleep.   SYMPTOMS    Not feeling rested in the morning.   Anxiety and restlessness at bedtime.   Difficulty falling and staying asleep.  TREATMENT    Your caregiver may prescribe treatment for an underlying medical disorders. Your caregiver can give advice or help if you are using alcohol or other drugs for self-medication. Treatment of underlying problems will usually eliminate insomnia problems.   Medications can be prescribed for short time use. They are generally not recommended for lengthy use.   Over-the-counter sleep medicines are not recommended for lengthy use. They can be habit forming.   You can promote easier sleeping by making lifestyle changes such as:   Using relaxation techniques that help with breathing and reduce muscle tension.   Exercising earlier in the day.   Changing your diet and the time of your last meal. No night time snacks.   Establish a regular time to go to bed.   Counseling can help with stressful problems and worry.   Soothing music and white noise may be helpful if there are background noises you cannot remove.   Stop tedious detailed work at least one hour before bedtime.  HOME CARE INSTRUCTIONS    Keep a diary. Inform your caregiver about your progress. This includes any medication side effects. See your caregiver regularly. Take note of:   Times when you are asleep.   Times when you are awake during the night.   The quality of your sleep.   How you feel the next day.  This information will help your caregiver care for you.  Get out of bed if you are still awake after 15 minutes. Read or do some quiet activity. Keep the lights down. Wait until you feel sleepy and go back to bed.   Keep regular sleeping and waking hours. Avoid naps.   Exercise regularly.   Avoid distractions at bedtime.  Distractions include watching television or engaging in any intense or detailed activity like attempting to balance the household checkbook.   Develop a bedtime ritual. Keep a familiar routine of bathing, brushing your teeth, climbing into bed at the same time each night, listening to soothing music. Routines increase the success of falling to sleep faster.   Use relaxation techniques. This can be using breathing and muscle tension release routines. It can also include visualizing peaceful scenes. You can also help control troubling or intruding thoughts by keeping your mind occupied with boring or repetitive thoughts like the old concept of counting sheep. You can make it more creative like imagining planting one beautiful flower after another in your backyard garden.   During your day, work to eliminate stress. When this is not possible use some of the previous suggestions to help reduce the anxiety that accompanies stressful situations.  MAKE SURE YOU:    Understand these instructions.   Will watch your condition.   Will get help right away if you are not doing well or get worse.  Document Released: 05/10/2000 Document Revised: 05/02/2011 Document Reviewed: 06/10/2007 [facility] Patient Information 2012 [place], [place].

## 2011-08-13 NOTE — Assessment & Plan Note (Signed)
Hx same - stress induced more than overexertion Neuro exam benign -  Muscle relaxer as needed and emphasized healthy back exercises + stress mgmt

## 2011-10-30 ENCOUNTER — Ambulatory Visit: Payer: BC Managed Care – PPO | Admitting: Internal Medicine

## 2011-12-02 ENCOUNTER — Ambulatory Visit: Payer: BC Managed Care – PPO | Admitting: Internal Medicine

## 2011-12-11 ENCOUNTER — Encounter (HOSPITAL_COMMUNITY): Payer: Self-pay | Admitting: *Deleted

## 2011-12-11 ENCOUNTER — Inpatient Hospital Stay (HOSPITAL_COMMUNITY)
Admission: AD | Admit: 2011-12-11 | Discharge: 2011-12-12 | Disposition: A | Discharge status: Home / Self Care | Payer: BC Managed Care – PPO | Source: Ambulatory Visit | Attending: Obstetrics | Admitting: Obstetrics

## 2011-12-11 ENCOUNTER — Inpatient Hospital Stay (HOSPITAL_COMMUNITY): Payer: BC Managed Care – PPO

## 2011-12-11 DIAGNOSIS — R109 Unspecified abdominal pain: Secondary | ICD-10-CM | POA: Insufficient documentation

## 2011-12-11 DIAGNOSIS — O209 Hemorrhage in early pregnancy, unspecified: Secondary | ICD-10-CM | POA: Insufficient documentation

## 2011-12-11 LAB — URINALYSIS, ROUTINE W REFLEX MICROSCOPIC
Glucose, UA: NEGATIVE mg/dL
Ketones, ur: NEGATIVE mg/dL
Leukocytes, UA: NEGATIVE
Protein, ur: NEGATIVE mg/dL
Specific Gravity, Urine: 1.025 (ref 1.005–1.030)
Urobilinogen, UA: 0.2 mg/dL (ref 0.0–1.0)
pH: 6 (ref 5.0–8.0)

## 2011-12-11 LAB — POCT PREGNANCY, URINE: Preg Test, Ur: POSITIVE — AB

## 2011-12-11 LAB — URINE MICROSCOPIC-ADD ON

## 2011-12-11 NOTE — MAU Note (Signed)
Dr. Ernestina Penna notified of pt.  Order rec'd.

## 2011-12-11 NOTE — MAU Note (Signed)
Dr. Ernestina Penna notified of pt return from U/S.

## 2011-12-11 NOTE — MAU Note (Signed)
Pt early pregnant, HCG 498 on 12/04/2011, scheduled for U/S 7/18.  Pt having lower abd cramping and small amt of bleeding this evening.

## 2011-12-12 NOTE — H&P (Signed)
CC: spotting and abdominal pain  History of the present illness: This is a 26 year old G1 at unknown gestational age due to history of irregular menses. Patient was diagnosed with a positive pregnancy test last week and  due to the unknown last menstrual period was followed with serial beta hCG. The beta hCG rose from 200-400 and by expected beta trend should be between 08-5998 today. Patient was planning a followup ultrasound at the office tomorrow.   Patient notes mild abdominal pain throughout the day today. This became somewhat sharper later this evening and was accompanied by small amount of vaginal bleeding. No current vaginal bleeding. Patient notes some dysuria. Patient has not tried any medication for her discomfort. Patient notes normal bowel movements and denies constipation.  NKDA PMH: irreg menstrual periods  Physical exam:  Filed Vitals:   12/11/11 2304  BP: 148/85  Pulse: 82  Temp: 98.6 F (37 C)  TempSrc: Oral  Resp: 16  Height: 5\' 1"  (1.549 m)  Weight: 94.892 kg (209 lb 3.2 oz)   Cardiovascular: Regular rate and rhythm Pulmonary: Clear to auscultation bilaterally Back: No CVAT Abdomen: Obese, nontender, nondistended, no rebound, no guarding, no dominant masses GU: Normal external genitalia, normal vagina, normal cervix, no abnormal discharge, no blood in the vagina, no cervical motion tenderness, no bladder wall tenderness, and no uterine tenderness, no adnexal masses or adnexal tenderness Lower extremities: No edema  Blood type: B positive, done in outpatient office Ultrasounds: Gestational sac with yolk sac measuring 5 weeks and 1 day, small subchorionic hemorrhage, no free fluid, no worrisome adnexal masses  Assessment and plan: This is 26 year old G1 at unknown gestational age who presents with mild abdominal cramping and spotting earlier today. Patient had ultrasound to confirm intrauterine gestation. Viability is still unknown at this time. Small subchorionic  hemorrhage noted. Findings and implications were discussed with the patient. We discussed that this is not an ectopic pregnancy and the plan at this time would be expectant management with followup ultrasound in my office in one to 2 weeks' time. Patient was instructed to start prenatal vitamins and use Tylenol as needed for pain. I discussed with patient the subchorionic hemorrhage is a slight increased risk in miscarriage and should she have heavier bleeding or increased cramping should call the office for a followup appointment sooner than the planned appointment in one to 2 weeks. Will send a urine culture  Arsen Mangione A. 12/12/2011 12:45 AM

## 2011-12-12 NOTE — MAU Note (Signed)
Dr. Fogleman in to see pt. 

## 2011-12-13 LAB — URINE CULTURE: Colony Count: >=100000

## 2012-05-30 ENCOUNTER — Ambulatory Visit (INDEPENDENT_AMBULATORY_CARE_PROVIDER_SITE_OTHER): Payer: BC Managed Care – PPO | Admitting: Physician Assistant

## 2012-05-30 VITALS — BP 161/96 | HR 121 | Temp 101.9°F | Resp 36 | Ht 62.0 in | Wt 227.0 lb

## 2012-05-30 DIAGNOSIS — R0981 Nasal congestion: Secondary | ICD-10-CM

## 2012-05-30 DIAGNOSIS — R509 Fever, unspecified: Secondary | ICD-10-CM

## 2012-05-30 DIAGNOSIS — R05 Cough: Secondary | ICD-10-CM

## 2012-05-30 DIAGNOSIS — J3489 Other specified disorders of nose and nasal sinuses: Secondary | ICD-10-CM

## 2012-05-30 DIAGNOSIS — R059 Cough, unspecified: Secondary | ICD-10-CM

## 2012-05-30 DIAGNOSIS — J111 Influenza due to unidentified influenza virus with other respiratory manifestations: Secondary | ICD-10-CM

## 2012-05-30 LAB — POCT INFLUENZA A/B
Influenza A, POC: POSITIVE
Influenza B, POC: NEGATIVE

## 2012-05-30 MED ORDER — HYDROCOD POLST-CHLORPHEN POLST 10-8 MG/5ML PO LQCR: 5.0000 mL | Freq: Two times a day (BID) | ORAL | Status: DC | PRN

## 2012-05-30 MED ORDER — OSELTAMIVIR PHOSPHATE 75 MG PO CAPS: 75.0000 mg | ORAL_CAPSULE | Freq: Two times a day (BID) | ORAL | Status: DC

## 2012-05-30 NOTE — Progress Notes (Signed)
Patient ID: Deanna Richards MRN: [id number], DOB: 24-May-1986, 27 y.o. Date of Encounter: 05/30/2012, 5:38 PM  Primary Physician: Rene Paci, MD  Chief Complaint: fever, chills, nasal congestion  HPI: 27 y.o. year old female with presents with 1 day history of nasal congestion, fever, chills, and slight dry cough. Denies chest pain, SOB, nausea, vomiting, or dizziness. Has not taken any OTC medications for this. No known flu contacts, but does admit that several co-workers have been sick with similar illnesses.   Patient is otherwise doing well without issues or complaints.  Past Medical History  Diagnosis Date  . PCOS (polycystic ovarian syndrome)     never on med  . CTS (carpal tunnel syndrome)     Right and left  . Allergy      Home Meds: Prior to Admission medications   Medication Sig Start Date End Date Taking? Authorizing Provider  Norgestim-Eth Estrad Triphasic (TRI-SPRINTEC PO) Take by mouth daily.   Yes Historical Provider, MD  chlorpheniramine-HYDROcodone (TUSSIONEX PENNKINETIC ER) 10-8 MG/5ML LQCR Take 5 mLs by mouth every 12 (twelve) hours as needed (cough). 05/30/12   Ahriyah Vannest Jaquita Rector, PA-C  oseltamivir (TAMIFLU) 75 MG capsule Take 1 capsule (75 mg total) by mouth 2 (two) times daily. 05/30/12   Nelva Nay, PA-C    Allergies: No Known Allergies  History   Social History  . Marital Status: Single    Spouse Name: N/A    Number of Children: N/A  . Years of Education: N/A   Occupational History  . Not on file.   Social History Main Topics  . Smoking status: Current Every Day Smoker    Types: Cigars  . Smokeless tobacco: Not on file     Comment: Born in [place], raised by foster family in [place]. Live in [place] since 2007- undergrad @ [employer]. Now grad student @ [employer]. Single, lives with boyfriend and roommate  . Alcohol Use: Yes     Comment: social  . Drug Use: No  . Sexually Active: Yes    Birth Control/ Protection: None, Pill   Other Topics Concern   . Not on file   Social History Narrative  . No narrative on file     Review of Systems: Constitutional: positive for chills and fever. Negative for night sweats, weight changes, or fatigue  HEENT: negative for vision changes, hearing loss. Positive for congestion, rhinorrhea, ST. No epistaxis, or sinus pressure Cardiovascular: negative for chest pain or palpitations Respiratory: positive for cough. negative for hemoptysis, wheezing, shortness of breath. Abdominal: negative for abdominal pain, nausea, vomiting, diarrhea, or constipation Dermatological: negative for rashes. Neurologic: negative for headache, dizziness, or syncope   Physical Exam: Blood pressure 161/96, pulse 121, temperature 101.9 F (38.8 C), resp. rate 36, height 5\' 2"  (1.575 m), weight 227 lb (102.967 kg), last menstrual period 05/20/2012, SpO2 98.00%, unknown if currently breastfeeding., Body mass index is 41.52 kg/(m^2). General: Well developed, well nourished, in no acute distress. Head: Normocephalic, atraumatic, eyes without discharge, sclera non-icteric, nares are without discharge. Bilateral auditory canals clear, TM's are without perforation, pearly grey and translucent with reflective cone of light bilaterally. Oral cavity moist, posterior pharynx without exudate, erythema, peritonsillar abscess, or post nasal drip.  Neck: Supple. No thyromegaly. Full ROM. No lymphadenopathy. Lungs: Clear bilaterally to auscultation without wheezes, rales, or rhonchi. Breathing is unlabored. Heart: RRR with S1 S2. No murmurs, rubs, or gallops appreciated. Msk:  Strength and tone normal for age. Extremities/Skin: Warm and dry. No clubbing or cyanosis.  No edema.  Neuro: Alert and oriented X 3. Moves all extremities spontaneously. Gait is normal. CNII-XII grossly in tact. Psych:  Responds to questions appropriately with a normal affect.   Labs: Results for orders placed in visit on 05/30/12  POCT INFLUENZA A/B      Component  Value Range   Influenza A, POC Positive     Influenza B, POC Negative       ASSESSMENT AND PLAN:  27 y.o. year old female with influenza Increase fluids and rest Ibuprofen and tylenol in alternating doses q3hours Start Tamiflu 75 mg bid x 5 days Tussionex q12hours prn cough Follow up if symptoms worsen or fail to improve.  Grier Mitts, PA-C 05/30/2012 5:38 PM

## 2012-07-14 ENCOUNTER — Ambulatory Visit (INDEPENDENT_AMBULATORY_CARE_PROVIDER_SITE_OTHER): Payer: BC Managed Care – PPO | Admitting: Internal Medicine

## 2012-07-14 ENCOUNTER — Encounter: Payer: Self-pay | Admitting: Internal Medicine

## 2012-07-14 VITALS — BP 124/88 | HR 87 | Temp 98.7°F | Wt 231.8 lb

## 2012-07-14 DIAGNOSIS — S93402A Sprain of unspecified ligament of left ankle, initial encounter: Secondary | ICD-10-CM

## 2012-07-14 DIAGNOSIS — M25569 Pain in unspecified knee: Secondary | ICD-10-CM

## 2012-07-14 DIAGNOSIS — E669 Obesity, unspecified: Secondary | ICD-10-CM

## 2012-07-14 DIAGNOSIS — S93409A Sprain of unspecified ligament of unspecified ankle, initial encounter: Secondary | ICD-10-CM

## 2012-07-14 DIAGNOSIS — Z0000 Encounter for general adult medical examination without abnormal findings: Secondary | ICD-10-CM

## 2012-07-14 DIAGNOSIS — M222X1 Patellofemoral disorders, right knee: Secondary | ICD-10-CM

## 2012-07-14 MED ORDER — DICLOFENAC SODIUM 75 MG PO TBEC: 75.0000 mg | DELAYED_RELEASE_TABLET | Freq: Two times a day (BID) | ORAL | Status: DC

## 2012-07-14 NOTE — Patient Instructions (Signed)
It was good to see you today. See below regarding ankle sprain and knee pain management Voltaren (generic) 2x/day with food for 1 week to help pain, then as needed - Your prescription(s) have been submitted to your pharmacy. Please take as directed and contact our office if you believe you are having problem(s) with the medication(s). Test(s) ordered today. Return when you are fasting later this week or next -Your results will be released to MyChart (or called to you) after review, usually within 72hours after test completion. If any changes need to be made, you will be notified at that same time. Health Maintenance reviewed - all recommended immunizations and age-appropriate screenings are up-to-date. Work on lifestyle changes as discussed (low fat, low carb, increased protein diet; improved exercise efforts; weight loss) to control sugar, blood pressure and cholesterol levels and/or reduce risk of developing other medical problems. Look into [email or web address] or other type of food journal to assist you in this process. Please schedule followup in 1 year for medical physical and labs, call sooner if problems.  Ankle Sprain An ankle sprain is an injury to the strong, fibrous tissues (ligaments) that hold the bones of your ankle joint together.  CAUSES An ankle sprain is usually caused by a fall or by twisting your ankle. Ankle sprains most commonly occur when you step on the outer edge of your foot, and your ankle turns inward. People who participate in sports are more prone to these types of injuries.  SYMPTOMS   Pain in your ankle. The pain may be present at rest or only when you are trying to stand or walk.  Swelling.  Bruising. Bruising may develop immediately or within 1 to 2 days after your injury.  Difficulty standing or walking, particularly when turning corners or changing directions. DIAGNOSIS  Your caregiver will ask you details about your injury and perform a physical exam of your  ankle to determine if you have an ankle sprain. During the physical exam, your caregiver will press on and apply pressure to specific areas of your foot and ankle. Your caregiver will try to move your ankle in certain ways. An X-ray exam may be done to be sure a bone was not broken or a ligament did not separate from one of the bones in your ankle (avulsion fracture).  TREATMENT  Certain types of braces can help stabilize your ankle. Your caregiver can make a recommendation for this. Your caregiver may recommend the use of medicine for pain. If your sprain is severe, your caregiver may refer you to a surgeon who helps to restore function to parts of your skeletal system (orthopedist) or a physical therapist. HOME CARE INSTRUCTIONS   Apply ice to your injury for 1 to 2 days or as directed by your caregiver. Applying ice helps to reduce inflammation and pain.  Put ice in a plastic bag.  Place a towel between your skin and the bag.  Leave the ice on for 15 to 20 minutes at a time, every 2 hours while you are awake.  Only take over-the-counter or prescription medicines for pain, discomfort, or fever as directed by your caregiver.  Keep your injured leg elevated, when possible, to lessen swelling.  If your caregiver recommends crutches, use them as instructed. Gradually put weight on the affected ankle. Continue to use crutches or a cane until you can walk without feeling pain in your ankle.  If you have a plaster splint, wear the splint as directed by your caregiver. Do not  rest it on anything harder than a pillow for the first 24 hours. Do not put weight on it. Do not get it wet. You may take it off to take a shower or bath.  You may have been given an elastic bandage to wear around your ankle to provide support. If the elastic bandage is too tight (you have numbness or tingling in your foot or your foot becomes cold and blue), adjust the bandage to make it comfortable.  If you have an air  splint, you may blow more air into it or let air out to make it more comfortable. You may take your splint off at night and before taking a shower or bath.  Wiggle your toes in the splint several times per day to decrease swelling. SEEK MEDICAL CARE IF:   You have an increase in bruising, swelling, or pain.  Your toes feel extremely cold or you lose feeling in your foot.  Your pain is not relieved with medicine. SEEK IMMEDIATE MEDICAL CARE IF:  Your toes are numb or blue.  You have severe pain. MAKE SURE YOU:   Understand these instructions.  Will watch your condition.  Will get help right away if you are not doing well or get worse. Document Released: 05/13/2005 Document Revised: 08/05/2011 Document Reviewed: 05/25/2011 [facility] Patient Information 2013 [place], [place]. Patellofemoral Syndrome If you have had pain in the front of your knee for a long time, chances are good that you have patellofemoral syndrome. The word patella refers to the kneecap. Femoral (or femur) refers to the thigh bone. That is the bone the kneecap sits on. The kneecap is shaped like a triangle. Its job is to protect the knee and to improve the efficiency of your thigh muscles (quadriceps). The underside of the kneecap is made of smooth tissue (cartilage). This lets the kneecap slide up and down as the knee moves. Sometimes this cartilage becomes soft. Your healthcare provider may say the cartilage breaks down. That is patellofemoral syndrome. It can affect one knee, or both. The condition is sometimes called patellofemoral pain syndrome. That is because the condition is painful. The pain usually gets worse with activity. Sitting for a long time with the knee bent also makes the pain worse. It usually gets better with rest and proper treatment. CAUSES  No one is sure why some people develop this problem and others do not. Runners often get it. One name for the condition is "runner's knee." However, some people run  for years and never have knee pain. Certain things seem to make patellofemoral syndrome more likely. They include:  Moving out of alignment. The kneecap is supposed to move in a straight line when the thigh muscle pulls on it. Sometimes the kneecap moves in poor alignment. That can make the knee swell and hurt. Some experts believe it also wears down the cartilage.  Injury to the kneecap.  Strain on the knee. This may occur during sports activity. Soccer, running, skiing and cycling can put excess stress on the knee.  Being flat-footed or knock-kneed. SYMPTOMS   Knee pain.  Pain under the kneecap. This is usually a dull, aching pain.  Pain in the knee when doing certain things: squatting, kneeling, going up or down stairs.  Pain in the knee when you stand up after sitting down for awhile.  Tightness in the knee.  Loss of muscle strength in the thigh.  Swelling of the knee. DIAGNOSIS  Healthcare providers often send people with knee pain  to an orthopedic caregiver. This person has special training to treat problems with bones and joints. To decide what is causing your knee pain, your caregiver will probably:  Do a physical exam. This will probably include:  Asking about symptoms you have noticed.  Asking about your activities and any injuries.  Feeling your knee. Moving it. This will help test the knee's strength. It will also check alignment (whether the knee and leg are aligned normally).  Order some tests, such as:  Imaging tests. They create pictures of the inside of the knee. Tests may include:  X-rays.  Computed tomography (CT) scan. This uses X-rays and a computer to show more detail.  Magnetic resonance imaging (MRI). This test uses magnets, radio waves and a computer to make pictures. TREATMENT   Medication is almost always used first. It can relieve pain. It also can reduce swelling. Non-steroidal anti-inflammatory medicines (called NSAIDs) are usually  suggested. Sometimes a stronger form is needed. A stronger form would require a prescription.  Other treatment may be needed after the swelling goes down. Possibilities include:  Exercise. Certain exercises can make the muscles around the knee stronger which decreases the pressure on the knee cap. This includes the thigh muscle. Certain exercises also may be suggested to increase your flexibility.  A knee brace. This gives the knee extra support and helps align the movement of the knee cap.  Orthotics. These are special shoe inserts. They can help keep your leg and knee aligned.  Surgery is sometimes needed. This is rare. Options include:  Arthroscopy. The surgeon uses a special tool to remove any damaged pieces of the kneecap. Only a few small incisions (cuts) are needed.  Realignment. This is open surgery. The goals are to reduce pressure and fix the way the kneecap moves. HOME CARE INSTRUCTIONS   Take any medication prescribed by your healthcare provider. Follow the directions carefully.  If your knee is swollen:  Put ice or cold packs on it. Do this for 20 to 30 minutes, 3 to 4 times a day.  Keep the knee raised. Make sure it is supported. Put a pillow under it.  Rest your knee. For example, take the elevator instead of the stairs for awhile. Or, take a break from sports activity that strain your knee. Try walking or swimming instead.  Whenever you are active:  Use an elastic bandage on your knee. This gives it support.  After any activity, put ice or cold packs on your knees. Do this for about 10 to 20 minutes.  Make sure you wear shoes that give good support. Make sure they are not worn down. The heels should not slant in or out. SEEK MEDICAL CARE IF:   Knee pain gets worse. Or it does not go away, even after taking pain medicine.  Swelling does not go down.  Your thigh muscle becomes weak.  You have an oral temperature above 102 F (38.9 C). SEEK IMMEDIATE MEDICAL  CARE IF:  You have an oral temperature above 102 F (38.9 C), not controlled by medicine. Document Released: 05/01/2009 Document Revised: 08/05/2011 Document Reviewed: 05/01/2009 [facility] Patient Information 2013 [place], [place].

## 2012-07-14 NOTE — Progress Notes (Signed)
Subjective:    Patient ID: Deanna Richards, female    DOB: Jul 12, 1985, 27 y.o.   MRN: [id number]  HPI patient is here today for annual physical. Patient feels well overall  Concerned about L ankle pain Precipitated by roll injury Onset approx 8 weeks ago Not associated with swelling or inability to bear weight Not using OTC NSAIDs, support or other tx Not improved with time, rest or elevation  Also chest pain R anterior knee pain symptoms exacerbated by climbing/descending stairs or kneeling/direct pressure No knee swelling, no injury or trauma No locking or popping  Past Medical History  Diagnosis Date  . PCOS (polycystic ovarian syndrome)     never on med  . CTS (carpal tunnel syndrome)     Right and left  . Allergy    Family History  Problem Relation Age of Onset  . Alcohol abuse Mother   . Hypertension Other   . Hyperlipidemia Other   . Stroke Other    History  Substance Use Topics  . Smoking status: Current Every Day Smoker    Types: Cigars  . Smokeless tobacco: Not on file     Comment: Born in [place], raised by foster family in [place]. Live in [place] since 2007- undergrad @ [employer]. Now grad student @ [employer]. Single, lives with boyfriend and roommate  . Alcohol Use: Yes     Comment: social    Review of Systems Constitutional: Negative for fever -acknowledges weight change since summer 2013.  Respiratory: Negative for cough and shortness of breath.   Cardiovascular: Negative for chest pain or palpitations.  Gastrointestinal: Negative for abdominal pain, no bowel changes.  Musculoskeletal: Negative for gait problem or joint swelling.  Skin: Negative for rash.  Neurological: Negative for dizziness or headache.  No other specific complaints in a complete review of systems (except as listed in HPI above).     Objective:   Physical Exam BP 124/88  Pulse 87  Temp(Src) 98.7 F (37.1 C) (Oral)  Wt 231 lb 12.8 oz (105.144 kg)  BMI 42.39 kg/m2  SpO2 98% Wt  Readings from Last 3 Encounters:  07/14/12 231 lb 12.8 oz (105.144 kg)  05/30/12 227 lb (102.967 kg)  12/11/11 209 lb 3.2 oz (94.892 kg)   Constitutional: She is overweight, but appears well-developed and well-nourished. No distress.  HENT: Head: Normocephalic and atraumatic. Ears: B TMs ok, no erythema or effusion; Nose: Nose normal. Mouth/Throat: Oropharynx is clear and moist. No oropharyngeal exudate.  Eyes: Conjunctivae and EOM are normal. Pupils are equal, round, and reactive to light. No scleral icterus.  Neck: Thick. Normal range of motion. Neck supple. No JVD present. No thyromegaly present.  Cardiovascular: Normal rate, regular rhythm and normal heart sounds.  No murmur heard. No BLE edema. Pulmonary/Chest: Effort normal and breath sounds normal. No respiratory distress. She has no wheezes.  Abdominal: Soft. Bowel sounds are normal. She exhibits no distension. There is no tenderness. no masses Musculoskeletal: L ankle without gross deformity. No swelling. Tender to palpation anterior medially. Pain with passive external rotation<inversion. Ligamentous function stable and neurovascularly intact. Pain with WB effort. R knee with mild pain on palpation of femoral grove, but normal range of motion, no joint effusions. No gross deformities Neurological: She is alert and oriented to person, place, and time. No cranial nerve deficit. Coordination normal.  Skin: Skin is warm and dry. No rash noted. No erythema.  Psychiatric: She has a normal mood and affect. Her behavior is normal. Judgment and thought content normal.  Lab Results  Component Value Date   WBC 6.4 02/22/2010   HGB 13.7 02/22/2010   HCT 39.0 02/22/2010   PLT 238.0 02/22/2010   GLUCOSE 78 02/22/2010   CHOL 105 02/22/2010   TRIG 79.0 02/22/2010   HDL 42.60 02/22/2010   LDLCALC 47 02/22/2010   ALT 21 02/22/2010   AST 22 02/22/2010   NA 139 02/22/2010   K 4.3 02/22/2010   CL 108 02/22/2010   CREATININE 0.7 02/22/2010   BUN 13 02/22/2010    CO2 25 02/22/2010   TSH 1.71 02/22/2010       Assessment & Plan:   CPX/v70.0 - Patient has been counseled on age-appropriate routine health concerns for screening and prevention. These are reviewed and up-to-date. Immunizations are up-to-date or declined. Labs ordered and will be reviewed.  L ankle sprain - NSAIDs and compression recommended - education provided, to call if worse or unimproved in next 6 weeks  R femoropatellar syndrome - education provided on same - NSAIDs as above, demonstrated proper alignment for ascending/descneding stairs and advised on importance of weight loss  Obesity - weight trends reviewed - The patient is asked to make an attempt to improve diet and exercise patterns to aid in medical management of this problem.

## 2013-02-09 ENCOUNTER — Ambulatory Visit (INDEPENDENT_AMBULATORY_CARE_PROVIDER_SITE_OTHER): Payer: 59 | Admitting: Internal Medicine

## 2013-02-09 ENCOUNTER — Encounter: Payer: Self-pay | Admitting: Internal Medicine

## 2013-02-09 VITALS — BP 150/100 | HR 96 | Temp 99.3°F | Ht 61.0 in | Wt 239.0 lb

## 2013-02-09 DIAGNOSIS — I10 Essential (primary) hypertension: Secondary | ICD-10-CM

## 2013-02-09 DIAGNOSIS — M5412 Radiculopathy, cervical region: Secondary | ICD-10-CM | POA: Insufficient documentation

## 2013-02-09 MED ORDER — NAPROXEN 500 MG PO TABS: 500.0000 mg | ORAL_TABLET | Freq: Two times a day (BID) | ORAL | Status: DC

## 2013-02-09 NOTE — Assessment & Plan Note (Signed)
elev today liekly situational, o/w stable overall by history and exam, recent data reviewed with pt, and pt to continue medical treatment as before,  to f/u any worsening symptoms or concerns BP Readings from Last 3 Encounters:  02/09/13 150/100  07/14/12 124/88  05/30/12 161/96

## 2013-02-09 NOTE — Assessment & Plan Note (Signed)
Mild to mod, for nsaid prn, pt declines predpack or other tx for now, is improving through the day,  to f/u any worsening symptoms or concerns, consider MRI and/or orthopedic if worsens or persists

## 2013-02-09 NOTE — Progress Notes (Signed)
  Subjective:    Patient ID: Deanna Richards, female    DOB: 01/25/1986, 27 y.o.   MRN: [id number]  HPI  Here with acute onset LUE pain/weakness/numbness now mild but persistent after waking up with it this am, has improved throughout the day since starting at mod to severe, assoc with 1-2 wks lower post neck pain intermittent as well, though no neck pain now.  Pt has no bowel or bladder change, fever, wt loss,  worsening LE pain/numbness/weakness, gait change or falls. Has hx CTS but this pain is different, No elbow pain Past Medical History  Diagnosis Date  . PCOS (polycystic ovarian syndrome)     never on med  . CTS (carpal tunnel syndrome)     Right and left  . Allergy    Past Surgical History  Procedure Laterality Date  . No past surgeries    . Colposcopy      reports that she has been smoking Cigars.  She does not have any smokeless tobacco history on file. She reports that  drinks alcohol. She reports that she does not use illicit drugs. family history includes Alcohol abuse in her mother; Hyperlipidemia in her other; Hypertension in her other; Stroke in her other. No Known Allergies Current Outpatient Prescriptions on File Prior to Visit  Medication Sig Dispense Refill  . Norgestim-Eth Estrad Triphasic (TRI-SPRINTEC PO) Take by mouth daily.       No current facility-administered medications on file prior to visit.   Review of Systems  Constitutional: Negative for unexpected weight change, or unusual diaphoresis  HENT: Negative for tinnitus.   Eyes: Negative for photophobia and visual disturbance.  Respiratory: Negative for choking and stridor.   Gastrointestinal: Negative for vomiting and blood in stool.  Genitourinary: Negative for hematuria and decreased urine volume.  Musculoskeletal: Negative for acute joint swelling Skin: Negative for color change and wound.  Neurological: Negative for tremors and numbness other than noted  Psychiatric/Behavioral: Negative for decreased  concentration or  hyperactivity.       Objective:   Physical Exam BP 150/100  Pulse 96  Temp(Src) 99.3 F (37.4 C) (Oral)  Ht 5\' 1"  (1.549 m)  Wt 239 lb (108.41 kg)  BMI 45.18 kg/m2  SpO2 98% VS noted,  Constitutional: Pt appears well-developed and well-nourished.  HENT: Head: NCAT.  Right Ear: External ear normal.  Left Ear: External ear normal.  Eyes: Conjunctivae and EOM are normal. Pupils are equal, round, and reactive to light.  Neck: Normal range of motion. Neck supple.  Cardiovascular: Normal rate and regular rhythm.   Pulmonary/Chest: Effort normal and breath sounds normal.  Abd:  Soft, NT, non-distended, + BS Spine nontender Neurological: Pt is alert. Not confused , cn 2-12 intact, motor 4+/5 LUE distal with decresed sens to LT, dtrs small and symmetric Skin: Skin is warm. No erythema.  Psychiatric: Pt behavior is normal. Thought content normal.     Assessment & Plan:

## 2013-02-09 NOTE — Patient Instructions (Addendum)
Please take all new medication as prescribed - the naproxyn as needed for pain  OK to stop the diclofenac You can also take Tylenol up to 3000 mg per day for pain if needed  Please call if not improving to consider prednisone, or even MRI or orthopedic referral  Please continue all other medications as before, and refills have been done if requested. Please have the pharmacy call with any other refills you may need.

## 2013-07-05 ENCOUNTER — Encounter: Payer: Self-pay | Admitting: Internal Medicine

## 2013-07-05 ENCOUNTER — Ambulatory Visit (INDEPENDENT_AMBULATORY_CARE_PROVIDER_SITE_OTHER): Payer: 59 | Admitting: Internal Medicine

## 2013-07-05 VITALS — BP 120/76 | HR 93 | Temp 98.3°F | Wt 246.0 lb

## 2013-07-05 DIAGNOSIS — G4700 Insomnia, unspecified: Secondary | ICD-10-CM

## 2013-07-05 DIAGNOSIS — E663 Overweight: Secondary | ICD-10-CM

## 2013-07-05 DIAGNOSIS — M5380 Other specified dorsopathies, site unspecified: Secondary | ICD-10-CM

## 2013-07-05 MED ORDER — CYCLOBENZAPRINE HCL 5 MG PO TABS: 5.0000 mg | ORAL_TABLET | Freq: Three times a day (TID) | ORAL | Status: DC | PRN

## 2013-07-05 MED ORDER — ZOLPIDEM TARTRATE ER 6.25 MG PO TBCR: 6.2500 mg | EXTENDED_RELEASE_TABLET | Freq: Every evening | ORAL | Status: DC | PRN

## 2013-07-05 MED ORDER — ZOLPIDEM TARTRATE 5 MG PO TABS: 5.0000 mg | ORAL_TABLET | Freq: Every evening | ORAL | Status: DC | PRN

## 2013-07-05 MED ORDER — NAPROXEN SODIUM 220 MG PO TABS: 220.0000 mg | ORAL_TABLET | Freq: Every evening | ORAL | Status: DC | PRN

## 2013-07-05 NOTE — Assessment & Plan Note (Signed)
Wt Readings from Last 3 Encounters:  07/05/13 246 lb (111.585 kg)  02/09/13 239 lb (108.41 kg)  07/14/12 231 lb 12.8 oz (105.144 kg)   The patient is asked to make an attempt to improve diet and exercise patterns to aid in medical management of this problem.  Consider medication such as Contrave, Belviq, Qysmia or "victoza" injections as needed in future

## 2013-07-05 NOTE — Progress Notes (Signed)
Subjective:    Patient ID: Deanna Richards, female    DOB: 02/09/1986, 28 y.o.   MRN: [id number]  Back Pain This is a new problem. The current episode started in the past 7 days. The problem occurs constantly. The problem is unchanged. The pain is present in the thoracic spine. The quality of the pain is described as cramping and aching. The pain does not radiate. The pain is mild. The pain is worse during the day. The symptoms are aggravated by standing. Stiffness is present all day. Pertinent negatives include no bladder incontinence, bowel incontinence, chest pain, dysuria, headaches, leg pain, numbness, paresis, pelvic pain, tingling, weakness or weight loss. Risk factors include obesity. She has tried bed rest for the symptoms. The treatment provided mild relief.    Also reviewed chronic medical issues and interval medical events  Past Medical History  Diagnosis Date  . PCOS (polycystic ovarian syndrome)     never on med  . CTS (carpal tunnel syndrome)     Right and left  . Allergy     Review of Systems  Constitutional: Negative for weight loss.  Cardiovascular: Negative for chest pain.  Gastrointestinal: Negative for bowel incontinence.  Genitourinary: Negative for bladder incontinence, dysuria and pelvic pain.  Musculoskeletal: Positive for back pain.  Neurological: Negative for tingling, weakness, numbness and headaches.       Objective:   Physical Exam  BP 120/76  Pulse 93  Temp(Src) 98.3 F (36.8 C) (Oral)  Wt 246 lb (111.585 kg)  SpO2 96% Wt Readings from Last 3 Encounters:  07/05/13 246 lb (111.585 kg)  02/09/13 239 lb (108.41 kg)  07/14/12 231 lb 12.8 oz (105.144 kg)    Constitutional: She is overweight, but appears well-developed and well-nourished. No distress.  Neck: Normal range of motion. Neck supple. No JVD present. No thyromegaly present.  Cardiovascular: Normal rate, regular rhythm and normal heart sounds.  No murmur heard. No BLE  edema. Pulmonary/Chest: Effort normal and breath sounds normal. No respiratory distress. She has no wheezes.  MSkel: Back: full range of motion of thoracic and lumbar spine. Non tender to palpation. Negative straight leg raise. DTR's are symmetrically intact. Sensation intact in all dermatomes of the lower extremities. Full strength to manual muscle testing. patient is able to heel toe walk without difficulty and ambulates with antalgic gait. Psychiatric: She has a normal mood and affect. Her behavior is normal. Judgment and thought content normal.   Lab Results  Component Value Date   WBC 6.4 02/22/2010   HGB 13.7 02/22/2010   HCT 39.0 02/22/2010   PLT 238.0 02/22/2010   GLUCOSE 78 02/22/2010   CHOL 105 02/22/2010   TRIG 79.0 02/22/2010   HDL 42.60 02/22/2010   LDLCALC 47 02/22/2010   ALT 21 02/22/2010   AST 22 02/22/2010   NA 139 02/22/2010   K 4.3 02/22/2010   CL 108 02/22/2010   CREATININE 0.7 02/22/2010   BUN 13 02/22/2010   CO2 25 02/22/2010   TSH 1.71 02/22/2010    [place] Ob Comp Less 14 Wks  12/12/2011   OBSTETRICAL ULTRASOUND: This exam was performed within a [facility]. The OB US report was generated in the AS system, and faxed to the ordering physician.   This report is also available in [facility] [employer] and in the [employer] PACS. See AS Obstetric US report.  [place] Ob Transvaginal  12/12/2011   OBSTETRICAL ULTRASOUND: This exam was performed within a [facility]. The  OB US report was generated in the AS system, and faxed to the ordering physician.   This report is also available in [employer] and in the [employer]. See AS Obstetric US report.      Assessment & Plan:   Problem List Items Addressed This Visit   INSOMNIA     Uses generic Ambien as needed -  prefers extended release -refill provided today     MUSCLE SPASM, BACK - Primary     Hx same - stress induced more than overexertion Neuro exam benign -   Muscle relaxer as needed and emphasized healthy back exercises + stress mgmt Also over-the-counter Aleve once daily for the next 3-4 days, then as needed    Overweight      Wt Readings from Last 3 Encounters:  07/05/13 246 lb (111.585 kg)  02/09/13 239 lb (108.41 kg)  07/14/12 231 lb 12.8 oz (105.144 kg)   The patient is asked to make an attempt to improve diet and exercise patterns to aid in medical management of this problem.  Consider medication such as Contrave, Belviq, Qysmia or "victoza" injections as needed in future

## 2013-07-05 NOTE — Assessment & Plan Note (Signed)
Hx same - stress induced more than overexertion Neuro exam benign -  Muscle relaxer as needed and emphasized healthy back exercises + stress mgmt Also over-the-counter Aleve once daily for the next 3-4 days, then as needed

## 2013-07-05 NOTE — Assessment & Plan Note (Signed)
Uses generic Ambien as needed -  prefers extended release -refill provided today

## 2013-07-05 NOTE — Patient Instructions (Addendum)
It was good to see you today.  We have reviewed your prior records including labs and tests today  Work on lifestyle changes as discussed (low fat, low carb, increased protein diet; improved exercise efforts; weight loss) to control sugar, blood pressure and cholesterol levels and/or reduce risk of developing other medical problems. Look into [email or web address] or other type of food journal to assist you in this process. We can consider other prescription medication in the future if needed such as Belviq, Contrave, Qysmia or injections  Use Flexeril as needed for muscle spasm, especially at night Refill on Ambien Your prescription(s) have been submitted to your pharmacy. Please take as directed and contact our office if you believe you are having problem(s) with the medication(s). Focus on healthy back habits and good sleep hygiene as discussed  Back Pain, Adult Back pain is very common. The pain often gets better over time. The cause of back pain is usually not dangerous. Most people can learn to manage their back pain on their own.  HOME CARE   Stay active. Start with short walks on flat ground if you can. Try to walk farther each day.  Do not sit, drive, or stand in one place for more than 30 minutes. Do not stay in bed.  Do not avoid exercise or work. Activity can help your back heal faster.  Be careful when you bend or lift an object. Bend at your knees, keep the object close to you, and do not twist.  Sleep on a firm mattress. Lie on your side, and bend your knees. If you lie on your back, put a pillow under your knees.  Only take medicines as told by your doctor.  Put ice on the injured area.  Put ice in a plastic bag.  Place a towel between your skin and the bag.  Leave the ice on for 15-20 minutes, 03-04 times a day for the first 2 to 3 days. After that, you can switch between ice and heat packs.  Ask your doctor about back exercises or massage.  Avoid feeling anxious or  stressed. Find good ways to deal with stress, such as exercise. GET HELP RIGHT AWAY IF:   Your pain does not go away with rest or medicine.  Your pain does not go away in 1 week.  You have new problems.  You do not feel well.  The pain spreads into your legs.  You cannot control when you poop (bowel movement) or pee (urinate).  Your arms or legs feel weak or lose feeling (numbness).  You feel sick to your stomach (nauseous) or throw up (vomit).  You have belly (abdominal) pain.  You feel like you may pass out (faint). MAKE SURE YOU:   Understand these instructions.  Will watch your condition.  Will get help right away if you are not doing well or get worse. Document Released: 10/30/2007 Document Revised: 08/05/2011 Document Reviewed: 10/01/2010 [facility] Patient Information 2014 [place], [place]. Back Exercises Back exercises help treat and prevent back injuries. The goal is to increase your strength in your belly (abdominal) and back muscles. These exercises can also help with flexibility. Start these exercises when told by your doctor. HOME CARE Back exercises include: Pelvic Tilt.  Lie on your back with your knees bent. Tilt your pelvis until the lower part of your back is against the floor. Hold this position 5 to 10 sec. Repeat this exercise 5 to 10 times. Knee to Chest.  Pull 1 knee up against  your chest and hold for 20 to 30 seconds. Repeat this with the other knee. This may be done with the other leg straight or bent, whichever feels better. Then, pull both knees up against your chest. Sit-Ups or Curl-Ups.  Bend your knees 90 degrees. Start with tilting your pelvis, and do a partial, slow sit-up. Only lift your upper half 30 to 45 degrees off the floor. Take at least 2 to 3 seonds for each sit-up. Do not do sit-ups with your knees out straight. If partial sit-ups are difficult, simply do the above but with only tightening your belly (abdominal) muscles and holding it  as told. Hip-Lift.  Lie on your back with your knees flexed 90 degrees. Push down with your feet and shoulders as you raise your hips 2 inches off the floor. Hold for 10 seconds, repeat 5 to 10 times. Back Arches.  Lie on your stomach. Prop yourself up on bent elbows. Slowly press on your hands, causing an arch in your low back. Repeat 3 to 5 times. Shoulder-Lifts.  Lie face down with arms beside your body. Keep hips and belly pressed to floor as you slowly lift your head and shoulders off the floor. Do not overdo your exercises. Be careful in the beginning. Exercises may cause you some mild back discomfort. If the pain lasts for more than 15 minutes, stop the exercises until you see your doctor. Improvement with exercise for back problems is slow.  Document Released: 06/15/2010 Document Revised: 08/05/2011 Document Reviewed: 03/14/2011 [facility] Patient Information 2014 [place], [place].

## 2013-07-05 NOTE — Progress Notes (Signed)
Pre-visit discussion using our clinic review tool. No additional management support is needed unless otherwise documented below in the visit note.  

## 2013-07-12 ENCOUNTER — Telehealth: Payer: Self-pay | Admitting: *Deleted

## 2013-07-12 NOTE — Telephone Encounter (Signed)
We have not received any PA info from her pharmacy. Will need her pharmacy to send us PA # along with pt member ID for coverage...Deanna Richards/lmb

## 2013-07-12 NOTE — Telephone Encounter (Signed)
Patient phoned requesting to know the status of her pending [place] on her [place].  CB# [phone]

## 2013-07-14 NOTE — Telephone Encounter (Signed)
Phoned patient and left voicemail message, notifying her that she would need to contact her pharmacy to send us PA for her [place], per Lucy's request.

## 2013-07-28 ENCOUNTER — Other Ambulatory Visit: Payer: Self-pay | Admitting: *Deleted

## 2013-07-28 ENCOUNTER — Telehealth: Payer: Self-pay | Admitting: *Deleted

## 2013-07-28 NOTE — Telephone Encounter (Signed)
Received PA back med was denied. Fax denial to pharmacy. Called pt as well LMOM with denial reason and member service # so pt can call to see what her insurance will cover...Raechel Chute/lmb

## 2013-07-28 NOTE — Telephone Encounter (Signed)
Received fax pt is needing PA on her ambien cr 6.25 mg. Notified insurance they have fax over PA form has been completed & fax back waiting on approval status...Raechel Chute/lmb

## 2013-07-28 NOTE — Telephone Encounter (Signed)
Left msg on vm called 2 weeks ago about her ambien prescription. Called pt back inform her she should have received a call back from triage letting her know that we never received a denial coverage fax from her pharmacy. Inform pt to call pharmacy and have them to fax me the insurance # and once i receive i can get started on trying to get it approved...Raechel Chute/lmb

## 2013-08-04 ENCOUNTER — Telehealth: Payer: Self-pay | Admitting: *Deleted

## 2013-08-04 MED ORDER — ZOLPIDEM TARTRATE 10 MG PO TABS: 10.0000 mg | ORAL_TABLET | Freq: Every evening | ORAL | Status: AC | PRN

## 2013-08-04 NOTE — Telephone Encounter (Signed)
Will change Ambien CR to generic Ambien 10 mg

## 2013-08-04 NOTE — Telephone Encounter (Signed)
Patient phoned, stating she was phoning Lucy back, states that the PA that was denied last week, she phoned her ins co & they will cover 5 & 10 mg ambien and is requesting 10 mg.  CB# [phone]

## 2013-08-04 NOTE — Telephone Encounter (Signed)
Called pt no answer LMOM [facility]...Raechel Chute/lmb

## 2013-09-09 ENCOUNTER — Telehealth: Payer: Self-pay | Admitting: *Deleted

## 2013-09-09 NOTE — Telephone Encounter (Signed)
Pt called stating that she was having an allergic reaction to generic zyrtec. Pt states that she took the med on Friday & on Tuesday she started breaking out in a rash all over her body with minimal itching. Please advise.

## 2013-09-09 NOTE — Telephone Encounter (Signed)
1) stop zyrtec 2) use benadryl q4h prn itch (if needed) -hydrocortisone cream (OTC) also ok prn for skin rash/itch 3) start claritin 10mg  daily in place of Zyrtec for seasonal allergy symptoms  4) If rash/itch uncontrolled, advise OV to consider steroids if needed

## 2013-09-09 NOTE — Telephone Encounter (Signed)
Discussed with pt

## 2013-09-13 ENCOUNTER — Telehealth: Payer: Self-pay | Admitting: *Deleted

## 2013-09-13 DIAGNOSIS — G5600 Carpal tunnel syndrome, unspecified upper limb: Secondary | ICD-10-CM

## 2013-09-13 NOTE — Telephone Encounter (Signed)
Refer done.

## 2013-09-13 NOTE — Telephone Encounter (Signed)
Pt called requesting a referral to Orthopedics to evaluate Carpal Tunnel.  States her symptoms are worsening.  Please advise

## 2013-09-14 NOTE — Telephone Encounter (Signed)
Left detailed message on pts VM. 

## 2013-10-25 DIAGNOSIS — G5601 Carpal tunnel syndrome, right upper limb: Secondary | ICD-10-CM

## 2013-10-25 HISTORY — DX: Carpal tunnel syndrome, right upper limb: G56.01

## 2013-10-31 NOTE — H&P (Signed)
  Deanna Richards/[facility] [address]   [address] [place], [place] [address] [phone] A Division of [facility] Deanna Richards, M.D.   Robert A. Thurston Hole, M.D.   Burnell Blanks, M.D.   Eulas Post, M.D.   Lunette Stands, M.D [name]. Eulah Pont, M.D.  Buford Dresser, M.D.  Estell Harpin, M.D.    Melina Fiddler, M.D. Mary L. Isidoro Donning, PA-C  Kirstin A. Shepperson, PA-C  Josh Oran, PA-C [place], [place]   RE: Deanna, Richards   [id number]      DOB: 1985-06-23 INITIAL EVALUATION: 09-27-13 Reason for visit:  Evaluation bilateral carpal tunnel syndrome right worse than left. History of present illness: Deanna Richards has had roughly 10 years of off and on carpal tunnel symptoms. She had an EMG and nerve conduction studies 5 year ago that were positive for carpal tunnel syndrome, at that time she was in college and was hoping it would get better. She has done bracing she has not had an injection. The right hand has gotten severe enough to where she has permanent numbness in her long finger and it no longer goes away. She has difficulty with typing at work. She is a Child psychotherapist. Please see associated documentation for this clinic visit for further past medical, family, surgical and social history, review of systems, and exam findings as this was reviewed by me.  EXAMINATION: Well appearing female in no apparent distress. Both upper extremities have positive Phalen's and Durkan's. She has permanent numbness in her long finger and radial side of her ring finger on the right with increasing numbness in the thumb and index with Durkan's.   IMAGING: X-rays reviewed by me: 4 views of both hands demonstrate no osseous abnormality.    ASSESSMENT: Bilateral carpal tunnel syndrome right worse than left.  PLAN: 1. This was confirmed with EMG and nerve conductions roughly 5 years ago. 2. Her symptoms are very classic and I do not think we need to  repeat nerve studies. 3. She has tried multiple conservative treatments including bracing, she has not had an injection and I think it reasonable to try that today. I will provide 2:1 cortisone injection to the wrist. If that does not work I think it is reasonable to perform carpal tunnel release, we will have her ready to go should symptoms worsen or not resolve.  PROCEDURE NOTE: The patient's clinical condition is marked by substantial pain and/or significant functional disability. Other conservative therapy has not provided relief, is contraindicated, or not appropriate. There is a reasonable likelihood that injection will significantly improve the patient's pain and/or functional disability.  Patient is seated on the exam table, the right wrist is prepped with Betadine and alcohol and injected with 1:2 Celestone/Xylocaine.  Patient tolerates the procedure without difficulty.  [name].  Eulah Pont, M.D. Electronically verified by [name]. Eulah Pont, M.D. TDM:kah D 09-27-13 T 09-28-13

## 2013-11-01 ENCOUNTER — Encounter (HOSPITAL_BASED_OUTPATIENT_CLINIC_OR_DEPARTMENT_OTHER): Payer: Self-pay | Admitting: *Deleted

## 2013-11-05 ENCOUNTER — Encounter (HOSPITAL_BASED_OUTPATIENT_CLINIC_OR_DEPARTMENT_OTHER): Payer: Self-pay | Admitting: Anesthesiology

## 2013-11-05 ENCOUNTER — Ambulatory Visit (HOSPITAL_BASED_OUTPATIENT_CLINIC_OR_DEPARTMENT_OTHER): Payer: 59 | Admitting: Anesthesiology

## 2013-11-05 ENCOUNTER — Encounter (HOSPITAL_BASED_OUTPATIENT_CLINIC_OR_DEPARTMENT_OTHER): Payer: 59 | Admitting: Anesthesiology

## 2013-11-05 ENCOUNTER — Ambulatory Visit (HOSPITAL_BASED_OUTPATIENT_CLINIC_OR_DEPARTMENT_OTHER)
Admission: RE | Admit: 2013-11-05 | Discharge: 2013-11-05 | Disposition: A | Discharge status: Home / Self Care | Payer: 59 | Source: Ambulatory Visit | Attending: Orthopedic Surgery | Admitting: Orthopedic Surgery

## 2013-11-05 ENCOUNTER — Encounter (HOSPITAL_BASED_OUTPATIENT_CLINIC_OR_DEPARTMENT_OTHER)
Admission: RE | Disposition: A | Discharge status: Home / Self Care | Payer: Self-pay | Source: Ambulatory Visit | Attending: Orthopedic Surgery

## 2013-11-05 DIAGNOSIS — Z6841 Body Mass Index (BMI) 40.0 and over, adult: Secondary | ICD-10-CM | POA: Insufficient documentation

## 2013-11-05 DIAGNOSIS — F17200 Nicotine dependence, unspecified, uncomplicated: Secondary | ICD-10-CM | POA: Insufficient documentation

## 2013-11-05 DIAGNOSIS — I10 Essential (primary) hypertension: Secondary | ICD-10-CM | POA: Insufficient documentation

## 2013-11-05 DIAGNOSIS — G5600 Carpal tunnel syndrome, unspecified upper limb: Secondary | ICD-10-CM | POA: Insufficient documentation

## 2013-11-05 HISTORY — DX: Carpal tunnel syndrome, right upper limb: G56.01

## 2013-11-05 HISTORY — PX: CARPAL TUNNEL RELEASE: SHX101

## 2013-11-05 LAB — POCT HEMOGLOBIN-HEMACUE: HEMOGLOBIN: 15.9 g/dL — AB (ref 12.0–15.0)

## 2013-11-05 SURGERY — RELEASE, CARPAL TUNNEL, ENDOSCOPIC
Anesthesia: General | Site: Wrist | Laterality: Right

## 2013-11-05 MED ORDER — MIDAZOLAM HCL 2 MG/ML PO SYRP: 12.0000 mg | ORAL_SOLUTION | Freq: Once | ORAL | Status: DC | PRN

## 2013-11-05 MED ORDER — OXYCODONE HCL 5 MG PO TABS: 10.0000 mg | ORAL_TABLET | ORAL | Status: DC | PRN

## 2013-11-05 MED ORDER — OXYCODONE HCL 5 MG PO TABS: 5.0000 mg | ORAL_TABLET | Freq: Once | ORAL | Status: DC | PRN

## 2013-11-05 MED ORDER — FENTANYL CITRATE 0.05 MG/ML IJ SOLN
INTRAMUSCULAR | Status: DC | PRN
  Administered 2013-11-05 (×2): 50 ug via INTRAVENOUS
  Administered 2013-11-05: 100 ug via INTRAVENOUS

## 2013-11-05 MED ORDER — METOCLOPRAMIDE HCL 5 MG/ML IJ SOLN: 10.0000 mg | Freq: Once | INTRAMUSCULAR | Status: DC | PRN

## 2013-11-05 MED ORDER — FENTANYL CITRATE 0.05 MG/ML IJ SOLN
INTRAMUSCULAR | Status: AC
  Filled 2013-11-05: qty 6

## 2013-11-05 MED ORDER — DEXTROSE-NACL 5-0.45 % IV SOLN: 100.0000 mL/h | INTRAVENOUS | Status: DC

## 2013-11-05 MED ORDER — MIDAZOLAM HCL 5 MG/5ML IJ SOLN
INTRAMUSCULAR | Status: DC | PRN
  Administered 2013-11-05: 2 mg via INTRAVENOUS

## 2013-11-05 MED ORDER — CEFAZOLIN SODIUM-DEXTROSE 2-3 GM-% IV SOLR
2.0000 g | INTRAVENOUS | Status: AC
  Administered 2013-11-05: 2 g via INTRAVENOUS

## 2013-11-05 MED ORDER — DEXAMETHASONE SODIUM PHOSPHATE 4 MG/ML IJ SOLN
INTRAMUSCULAR | Status: DC | PRN
  Administered 2013-11-05: 10 mg via INTRAVENOUS

## 2013-11-05 MED ORDER — ACETAMINOPHEN 500 MG PO TABS
1000.0000 mg | ORAL_TABLET | Freq: Once | ORAL | Status: AC
  Administered 2013-11-05: 1000 mg via ORAL

## 2013-11-05 MED ORDER — CEFAZOLIN SODIUM-DEXTROSE 2-3 GM-% IV SOLR
INTRAVENOUS | Status: AC
  Filled 2013-11-05: qty 50

## 2013-11-05 MED ORDER — OXYCODONE HCL 5 MG/5ML PO SOLN: 5.0000 mg | Freq: Once | ORAL | Status: DC | PRN

## 2013-11-05 MED ORDER — FENTANYL CITRATE 0.05 MG/ML IJ SOLN
INTRAMUSCULAR | Status: AC
  Filled 2013-11-05: qty 2

## 2013-11-05 MED ORDER — MIDAZOLAM HCL 2 MG/2ML IJ SOLN: 1.0000 mg | INTRAMUSCULAR | Status: DC | PRN

## 2013-11-05 MED ORDER — BUPIVACAINE HCL (PF) 0.25 % IJ SOLN
INTRAMUSCULAR | Status: DC | PRN
  Administered 2013-11-05: 5 mL

## 2013-11-05 MED ORDER — ONDANSETRON HCL 4 MG/2ML IJ SOLN
INTRAMUSCULAR | Status: DC | PRN
  Administered 2013-11-05: 4 mg via INTRAVENOUS

## 2013-11-05 MED ORDER — BUPIVACAINE HCL (PF) 0.25 % IJ SOLN
INTRAMUSCULAR | Status: AC
  Filled 2013-11-05: qty 30

## 2013-11-05 MED ORDER — ACETAMINOPHEN 500 MG PO TABS
ORAL_TABLET | ORAL | Status: AC
  Filled 2013-11-05: qty 2

## 2013-11-05 MED ORDER — MIDAZOLAM HCL 2 MG/2ML IJ SOLN
INTRAMUSCULAR | Status: AC
  Filled 2013-11-05: qty 2

## 2013-11-05 MED ORDER — LACTATED RINGERS IV SOLN
INTRAVENOUS | Status: DC
  Administered 2013-11-05 (×2): via INTRAVENOUS

## 2013-11-05 MED ORDER — FENTANYL CITRATE 0.05 MG/ML IJ SOLN
25.0000 ug | INTRAMUSCULAR | Status: DC | PRN
  Administered 2013-11-05: 25 ug via INTRAVENOUS

## 2013-11-05 MED ORDER — DOCUSATE SODIUM 100 MG PO CAPS: 100.0000 mg | ORAL_CAPSULE | Freq: Two times a day (BID) | ORAL | Status: DC

## 2013-11-05 MED ORDER — BUPIVACAINE HCL (PF) 0.5 % IJ SOLN
INTRAMUSCULAR | Status: AC
  Filled 2013-11-05: qty 30

## 2013-11-05 MED ORDER — LIDOCAINE HCL (CARDIAC) 20 MG/ML IV SOLN
INTRAVENOUS | Status: DC | PRN
  Administered 2013-11-05: 75 mg via INTRAVENOUS

## 2013-11-05 MED ORDER — PROPOFOL 10 MG/ML IV BOLUS
INTRAVENOUS | Status: DC | PRN
  Administered 2013-11-05: 250 mg via INTRAVENOUS

## 2013-11-05 MED ORDER — FENTANYL CITRATE 0.05 MG/ML IJ SOLN: 50.0000 ug | INTRAMUSCULAR | Status: DC | PRN

## 2013-11-05 MED ORDER — ONDANSETRON HCL 4 MG PO TABS: 4.0000 mg | ORAL_TABLET | Freq: Three times a day (TID) | ORAL | Status: DC | PRN

## 2013-11-05 SURGICAL SUPPLY — 50 items
BANDAGE ELASTIC 3 VELCRO ST LF (GAUZE/BANDAGES/DRESSINGS) ×2 IMPLANT
BLADE SLIMLINE EXTR (BLADE) ×2 IMPLANT
BLADE SURG 15 STRL LF DISP TIS (BLADE) ×1 IMPLANT
BLADE SURG 15 STRL SS (BLADE) ×1
BNDG ESMARK 4X9 LF (GAUZE/BANDAGES/DRESSINGS) ×2 IMPLANT
CHLORAPREP W/TINT 26ML (MISCELLANEOUS) ×2 IMPLANT
CORDS BIPOLAR (ELECTRODE) IMPLANT
COVER TABLE BACK 60X90 (DRAPES) ×2 IMPLANT
CUFF TOURNIQUET SINGLE 18IN (TOURNIQUET CUFF) ×2 IMPLANT
DRAPE EXTREMITY T 121X128X90 (DRAPE) ×2 IMPLANT
DRAPE SURG 17X23 STRL (DRAPES) ×2 IMPLANT
DRAPE U 20/CS (DRAPES) ×4 IMPLANT
DRSG EMULSION OIL 3X3 NADH (GAUZE/BANDAGES/DRESSINGS) ×2 IMPLANT
DRSG TEGADERM 2-3/8X2-3/4 SM (GAUZE/BANDAGES/DRESSINGS) ×2 IMPLANT
GAUZE SPONGE 4X4 12PLY STRL (GAUZE/BANDAGES/DRESSINGS) ×2 IMPLANT
GLOVE BIO SURGEON STRL SZ7 (GLOVE) ×2 IMPLANT
GLOVE BIO SURGEON STRL SZ7.5 (GLOVE) ×2 IMPLANT
GLOVE BIO SURGEON STRL SZ8 (GLOVE) IMPLANT
GLOVE BIOGEL PI IND STRL 6.5 (GLOVE) ×1 IMPLANT
GLOVE BIOGEL PI IND STRL 7.0 (GLOVE) ×3 IMPLANT
GLOVE BIOGEL PI IND STRL 8 (GLOVE) ×1 IMPLANT
GLOVE BIOGEL PI IND STRL 8.5 (GLOVE) IMPLANT
GLOVE BIOGEL PI INDICATOR 6.5 (GLOVE) ×1
GLOVE BIOGEL PI INDICATOR 7.0 (GLOVE) ×3
GLOVE BIOGEL PI INDICATOR 8 (GLOVE) ×1
GLOVE BIOGEL PI INDICATOR 8.5 (GLOVE)
GOWN STRL REUS W/ TWL LRG LVL3 (GOWN DISPOSABLE) ×5 IMPLANT
GOWN STRL REUS W/ TWL XL LVL3 (GOWN DISPOSABLE) ×1 IMPLANT
GOWN STRL REUS W/TWL LRG LVL3 (GOWN DISPOSABLE) ×5
GOWN STRL REUS W/TWL XL LVL3 (GOWN DISPOSABLE) ×1
NEEDLE HYPO 25X1 1.5 SAFETY (NEEDLE) ×2 IMPLANT
NS IRRIG 1000ML POUR BTL (IV SOLUTION) ×2 IMPLANT
PACK BASIN DAY SURGERY FS (CUSTOM PROCEDURE TRAY) ×2 IMPLANT
PAD CAST 3X4 CTTN HI CHSV (CAST SUPPLIES) ×1 IMPLANT
PADDING CAST ABS 3INX4YD NS (CAST SUPPLIES)
PADDING CAST ABS 4INX4YD NS (CAST SUPPLIES)
PADDING CAST ABS COTTON 3X4 (CAST SUPPLIES) IMPLANT
PADDING CAST ABS COTTON 4X4 ST (CAST SUPPLIES) IMPLANT
PADDING CAST COTTON 3X4 STRL (CAST SUPPLIES) ×1
SOLUTION ANTI FOG 6CC (MISCELLANEOUS) ×2 IMPLANT
SPLINT FAST PLASTER 5X30 (CAST SUPPLIES) ×5
SPLINT PLASTER CAST FAST 5X30 (CAST SUPPLIES) ×5 IMPLANT
SPLINT PLASTER CAST XFAST 3X15 (CAST SUPPLIES) IMPLANT
SPLINT PLASTER XTRA FASTSET 3X (CAST SUPPLIES)
SUT ETHILON 3 0 PS 1 (SUTURE) ×2 IMPLANT
SYR BULB 3OZ (MISCELLANEOUS) ×2 IMPLANT
SYR CONTROL 10ML LL (SYRINGE) ×2 IMPLANT
TOWEL OR 17X24 6PK STRL BLUE (TOWEL DISPOSABLE) ×2 IMPLANT
TOWEL OR NON WOVEN STRL DISP B (DISPOSABLE) IMPLANT
UNDERPAD 30X30 INCONTINENT (UNDERPADS AND DIAPERS) ×2 IMPLANT

## 2013-11-05 NOTE — Anesthesia Postprocedure Evaluation (Signed)
Anesthesia Post Note  Patient: Deanna Richards  Procedure(s) Performed: Procedure(s) (LRB): CARPAL TUNNEL RELEASE ENDOSCOPIC RIGHT WRIST (Right)  Anesthesia type: General  Patient location: PACU  Post pain: Pain level controlled  Post assessment: Patient's Cardiovascular Status Stable  Last Vitals:  Filed Vitals:   11/05/13 1400  BP: 145/92  Pulse: 78  Temp:   Resp: 20    Post vital signs: Reviewed and stable  Level of consciousness: alert  Complications: No apparent anesthesia complications

## 2013-11-05 NOTE — Anesthesia Procedure Notes (Signed)
Procedure Name: LMA Insertion Date/Time: 11/05/2013 12:46 PM Performed by: Zenia ResidesPAYNE, Birney Belshe D Pre-anesthesia Checklist: Patient identified, Emergency Drugs available, Suction available and Patient being monitored Patient Re-evaluated:Patient Re-evaluated prior to inductionOxygen Delivery Method: Circle System Utilized Preoxygenation: Pre-oxygenation with 100% oxygen Intubation Type: IV induction Ventilation: Mask ventilation without difficulty LMA: LMA inserted LMA Size: 4.0 Number of attempts: 1 Airway Equipment and Method: bite block Placement Confirmation: positive ETCO2 Tube secured with: Tape Dental Injury: Teeth and Oropharynx as per pre-operative assessment

## 2013-11-05 NOTE — Op Note (Signed)
11/05/2013  1:21 PM  PATIENT:  Deanna Richards    PRE-OPERATIVE DIAGNOSIS:  right wrist carpal tunnel syndrome  POST-OPERATIVE DIAGNOSIS:  Same  PROCEDURE:  CARPAL TUNNEL RELEASE ENDOSCOPIC RIGHT WRIST  SURGEON:  Abryanna Musolino, D, MD  PHYSICIAN ASSISTANT: none  ANESTHESIA:   General  PREOPERATIVE INDICATIONS:  Deanna Richards is a  28 y.o. female with a diagnosis of right wrist carpal tunnel syndrome who failed conservative measures and elected for surgical management.    The risks benefits and alternatives were discussed with the patient preoperatively including but not limited to the risks of infection, bleeding, nerve injury, incomplete relief of symptoms, pillar pain, cardiopulmonary complications, the need for revision surgery, among others, and the patient was willing to proceed.  OPERATIVE FINDINGS: complete release  OPERATIVE PROCEDURE: Patient was identified in the preoperative holding area and site was marked by me. female was transported to the operating theater and placed on the table in the supine position taking care to pad all bony prominences. After appropriate time out general anesthesia was induced and female received ancef for preoperative antibiotics. The extremity was prepped and draped in normal sterile fashion.  I made a 1 severe incision just proximal to the dominant volar wrist crease in line with the radial aspect of the small finger. Spread down to the fascia and this was incised in line with the incision. Metzenbaum scissors were then prepped passed above and below the fascia proximally to clear the nerve away from the fascia clear soft tissue plane superficial to it as well. This was then incised.  I then turned attention distally where the fascia edge was as was visible within the wound. I sequentially dilated the carpal tunnel sweeping soft tissue off of the undersurface of the ligament which was palpable throughout each past. I then inserted the  endoscope and under direct visualization was able to see the undersurface the carpal tunnel throughout the entire insertion. Wasn't visualized the distal end of the ligament I put the blade retracted the instrument incising the transverse carpal ligament. I then reinserted the endoscope and directly visualized each cut and as well as intact median nerve without harm.   Wound was then irrigated and closed with a superficial suture. A sterile dressing and splint was applied   She tolerated this well, with no complications.  POSTOPERATIVE PLAN: Keep the splint clean and dry. VTE prophylaxis will consist of ambulation and foot pump exercises

## 2013-11-05 NOTE — Anesthesia Preprocedure Evaluation (Signed)
Anesthesia Evaluation  Patient identified by MRN, date of birth, ID band Patient awake    Reviewed: Allergy & Precautions, H&P , NPO status , Patient's Chart, lab work & pertinent test results, reviewed documented beta blocker date and time   Airway Mallampati: II TM Distance: >3 FB Neck ROM: full    Dental   Pulmonary neg pulmonary ROS, Current Smoker,  breath sounds clear to auscultation        Cardiovascular hypertension, On Medications negative cardio ROS  Rhythm:regular     Neuro/Psych  Headaches,  Neuromuscular disease negative neurological ROS  negative psych ROS   GI/Hepatic negative GI ROS, Neg liver ROS,   Endo/Other  Morbid obesity  Renal/GU negative Renal ROS  negative genitourinary   Musculoskeletal   Abdominal   Peds  Hematology negative hematology ROS (+)   Anesthesia Other Findings See surgeon's H&P   Reproductive/Obstetrics negative OB ROS                           Anesthesia Physical Anesthesia Plan  ASA: II  Anesthesia Plan: General   Post-op Pain Management:    Induction: Intravenous  Airway Management Planned: LMA  Additional Equipment:   Intra-op Plan:   Post-operative Plan:   Informed Consent: I have reviewed the patients History and Physical, chart, labs and discussed the procedure including the risks, benefits and alternatives for the proposed anesthesia with the patient or authorized representative who has indicated his/her understanding and acceptance.   Dental Advisory Given  Plan Discussed with: CRNA and Surgeon  Anesthesia Plan Comments:         Anesthesia Quick Evaluation

## 2013-11-05 NOTE — Discharge Instructions (Signed)
Keep splint clean and dry  Discharge Instructions After Orthopedic Procedures:  *You may feel tired and weak following your procedure. It is recommended that you limit physical activity for the next 24 hours and rest at home for the remainder of today and tomorrow. *No strenuous activity should be started without your doctor's permission.  Elevate the extremity that you had surgery on to a level above your heart. This should continue for 48 hours or as instructed by your doctor.  If you had hand, arm or shoulder surgery you should move your fingers frequently unless otherwise instructed by your doctor.  If you had foot, knee or leg surgery you should wiggle your toes frequently unless otherwise instructed by your doctor.  Follow your doctor's exact instructions for activity at home. Use your home equipment as instructed. (Crutches, hard shoes, slings etc.)  Limit your activity as instructed by your doctor.  Report to your doctor should any of the following occur: 1. Extreme swelling of your fingers or toes. 2. Inability to wiggle your fingers or toes. 3. Coldness, pale or bluish color in your fingers or toes. 4. Loss of sensation, numbness or tingling of your fingers or toes. 5. Unusual smell or odor from under your dressing or cast. 6. Excessive bleeding or drainage from the surgical site. 7. Pain not relieved by medication your doctor has prescribed for you. 8. Cast or dressing too tight (do not get your dressing or cast wet or put anything under          your dressing or cast.)  *Do not change your dressing unless instructed by your doctor or discharge nurse. Then follow exact instructions.  *Follow labeled instructions for any medications that your doctor may have prescribed for you. *Should any questions or complications develop following your procedure, PLEASE CONTACT YOUR DOCTOR.      Post Anesthesia Home Care Instructions  Activity: Get plenty of rest for the remainder  of the day. A responsible adult should stay with you for 24 hours following the procedure.  For the next 24 hours, DO NOT: -Drive a car -Advertising copywriterperate machinery -Drink alcoholic beverages -Take any medication unless instructed by your physician -Make any legal decisions or sign important papers.  Meals: Start with liquid foods such as gelatin or soup. Progress to regular foods as tolerated. Avoid greasy, spicy, heavy foods. If nausea and/or vomiting occur, drink only clear liquids until the nausea and/or vomiting subsides. Call your physician if vomiting continues.  Special Instructions/Symptoms: Your throat may feel dry or sore from the anesthesia or the breathing tube placed in your throat during surgery. If this causes discomfort, gargle with warm salt water. The discomfort should disappear within 24 hours.

## 2013-11-05 NOTE — Transfer of Care (Signed)
Immediate Anesthesia Transfer of Care Note  Patient: Deanna QuillShenika M Richards  Procedure(s) Performed: Procedure(s): CARPAL TUNNEL RELEASE ENDOSCOPIC RIGHT WRIST (Right)  Patient Location: PACU  Anesthesia Type:General  Level of Consciousness: awake, alert  and oriented  Airway & Oxygen Therapy: Patient Spontanous Breathing and Patient connected to face mask oxygen  Post-op Assessment: Report given to PACU RN and Post -op Vital signs reviewed and stable  Post vital signs: Reviewed and stable  Complications: No apparent anesthesia complications

## 2013-11-05 NOTE — Interval H&P Note (Signed)
History and Physical Interval Note:  11/05/2013 12:37 PM  Deanna MillerShenika Derryl HarborM Passarella  has presented today for surgery, with the diagnosis of right wrist carpal tunnel syndrome  The various methods of treatment have been discussed with the patient and family. After consideration of risks, benefits and other options for treatment, the patient has consented to  Procedure(s): CARPAL TUNNEL RELEASE ENDOSCOPIC RIGHT WRIST (Right) as a surgical intervention .  The patient's history has been reviewed, patient examined, no change in status, stable for surgery.  I have reviewed the patient's chart and labs.  Questions were answered to the patient's satisfaction.     Shantoya Geurts, D

## 2013-11-08 ENCOUNTER — Encounter (HOSPITAL_BASED_OUTPATIENT_CLINIC_OR_DEPARTMENT_OTHER): Payer: Self-pay | Admitting: Orthopedic Surgery

## 2014-03-05 ENCOUNTER — Other Ambulatory Visit: Payer: Self-pay | Admitting: Internal Medicine

## 2014-03-28 ENCOUNTER — Encounter (HOSPITAL_BASED_OUTPATIENT_CLINIC_OR_DEPARTMENT_OTHER): Payer: Self-pay | Admitting: Orthopedic Surgery

## 2014-05-09 ENCOUNTER — Ambulatory Visit (INDEPENDENT_AMBULATORY_CARE_PROVIDER_SITE_OTHER): Payer: 59 | Admitting: Physician Assistant

## 2014-05-09 ENCOUNTER — Telehealth: Payer: Self-pay | Admitting: *Deleted

## 2014-05-09 VITALS — BP 132/88 | HR 82 | Temp 98.1°F | Resp 20 | Ht 62.0 in | Wt 231.2 lb

## 2014-05-09 DIAGNOSIS — J029 Acute pharyngitis, unspecified: Secondary | ICD-10-CM

## 2014-05-09 LAB — POCT RAPID STREP A (OFFICE): RAPID STREP A SCREEN: NEGATIVE

## 2014-05-09 NOTE — Patient Instructions (Signed)
Your throat swab was negative for a bacterial throat infection (strep throat). We have sent a culture to the lab to analyze as the test is not 100%. I will contact you when the results come back if they show you do actually have a strep infection.  Please come back to clinic in 3-5 days if your symptoms are not improving or if you begin to feel worse.   Sore Throat A sore throat is pain, burning, irritation, or scratchiness of the throat. There is often pain or tenderness when swallowing or talking. A sore throat may be accompanied by other symptoms, such as coughing, sneezing, fever, and swollen neck glands. A sore throat is often the first sign of another sickness, such as a cold, flu, strep throat, or mononucleosis (commonly known as mono). Most sore throats go away without medical treatment. CAUSES  The most common causes of a sore throat include:  A viral infection, such as a cold, flu, or mono.  A bacterial infection, such as strep throat, tonsillitis, or whooping cough.  Seasonal allergies.  Dryness in the air.  Irritants, such as smoke or pollution.  Gastroesophageal reflux disease (GERD). HOME CARE INSTRUCTIONS   Only take over-the-counter medicines as directed by your caregiver.  Drink enough fluids to keep your urine clear or pale yellow.  Rest as needed.  Try using throat sprays, lozenges, or sucking on hard candy to ease any pain (if older than 4 years or as directed).  Sip warm liquids, such as broth, herbal tea, or warm water with honey to relieve pain temporarily. You may also eat or drink cold or frozen liquids such as frozen ice pops.  Gargle with salt water (mix 1 tsp salt with 8 oz of water).  Do not smoke and avoid secondhand smoke.  Put a cool-mist humidifier in your bedroom at night to moisten the air. You can also turn on a hot shower and sit in the bathroom with the door closed for 5-10 minutes. SEEK IMMEDIATE MEDICAL CARE IF:  You have difficulty  breathing.  You are unable to swallow fluids, soft foods, or your saliva.  You have increased swelling in the throat.  Your sore throat does not get better in 7 days.  You have nausea and vomiting.  You have a fever or persistent symptoms for more than 2-3 days.  You have a fever and your symptoms suddenly get worse. MAKE SURE YOU:   Understand these instructions.  Will watch your condition.  Will get help right away if you are not doing well or get worse. Document Released: 06/20/2004 Document Revised: 04/29/2012 Document Reviewed: 01/19/2012 [facility] Patient Information 2015 [place], [place]. This information is not intended to replace advice given to you by your health care provider. Make sure you discuss any questions you have with your health care provider.

## 2014-05-09 NOTE — Progress Notes (Signed)
Subjective:    Patient ID: Deanna Richards, female    DOB: 12/08/1985, 28 y.o.   MRN: [id number]  PCP: Rene PaciValerie Leschber, MD  Chief Complaint  Patient presents with  . Sore Throat    Possible strep throat. [id number]   Patient Active Problem List   Diagnosis Date Noted  . Cervical radiculopathy 02/09/2013  . INSOMNIA 05/29/2010  . Overweight 02/22/2010  . SMOKER 02/22/2010  . MIGRAINE HEADACHE 02/22/2010  . HYPERTENSION 02/22/2010  . MUSCLE SPASM, BACK 02/22/2010   Prior to Admission medications   Medication Sig Start Date End Date Taking? Authorizing Provider  cyclobenzaprine (FLEXERIL) 5 MG tablet Take 1 tablet (5 mg total) by mouth every 8 (eight) hours as needed for muscle spasms. 07/05/13 07/05/14 Yes Newt LukesValerie A Leschber, MD  folic acid (FOLVITE) 1 MG tablet Take 1 mg by mouth daily.   Yes Historical Provider, MD  naproxen sodium (ALEVE) 220 MG tablet Take 1 tablet (220 mg total) by mouth at bedtime as needed. 07/05/13  Yes Newt LukesValerie A Leschber, MD  norgestimate-ethinyl estradiol (MONONESSA) 0.25-35 MG-MCG tablet Take 1 tablet by mouth daily.   Yes Historical Provider, MD  zolpidem (AMBIEN) 10 MG tablet TAKE ONE TABLET BY MOUTH AT BEDTIME AS NEEDED FOR SLEEP 03/07/14  Yes Newt LukesValerie A Leschber, MD   Medications, allergies, past medical history, surgical history, family history, social history and problem list reviewed and updated.  HPI  [age] yof with no pertinent PMH presents with 5 day h/o sore throat.  Sx started 5 days ago with mild sore throat, subjective fever, and frontal HA. She took mucinex and rested and sx mostly resolved after 1-2 days except mild sore throat persisted. Has been much worse past couple days, feels raw when swallowing or if she has an occasional cough.   Denies otalgia, chills, N/V, diarrhea. HA has not returned since resolving 3 days ago. Her cough is very mild and intermittent, she thinks she has a baseline occasional cough and this is not any different. She  smokes 2-3 cigars per day. Denies CP, SOB, night sweats, unintentional wt loss. No vision changes, numbness, or weakness with HA.   Mentions mild abd pain yest which is improved this am.   Review of Systems No dysuria. See HPI.     Objective:   Physical Exam  Constitutional: She is oriented to person, place, and time. She appears well-developed and well-nourished.  Non-toxic appearance. She does not have a sickly appearance. She does not appear ill. No distress.  BP 132/88 mmHg  Pulse 82  Temp(Src) 98.1 F (36.7 C) (Oral)  Resp 20  Ht 5\' 2"  (1.575 m)  Wt 231 lb 3.2 oz (104.872 kg)  BMI 42.28 kg/m2  SpO2 99%  LMP 04/29/2014   HENT:  Right Ear: Tympanic membrane normal.  Left Ear: Tympanic membrane normal.  Nose: Nose normal.  Mouth/Throat: Uvula is midline and mucous membranes are normal. Mucous membranes are not pale. No dental abscesses or uvula swelling. Posterior oropharyngeal erythema present. No oropharyngeal exudate, posterior oropharyngeal edema or tonsillar abscesses.  Very mild erythema across posterior pharynx. No exudates. No peritonsillar abscess. No retropharyngeal abscess. Voice normal today.   Cardiovascular: Normal rate, regular rhythm and normal heart sounds.   Pulmonary/Chest: Effort normal and breath sounds normal. No tachypnea. She has no decreased breath sounds. She has no wheezes. She has no rhonchi. She has no rales.  Abdominal: Soft. Normal appearance and bowel sounds are normal. There is no tenderness.  Lymphadenopathy:  Head (right side): No submental, no submandibular and no tonsillar adenopathy present.       Head (left side): No submental, no submandibular and no tonsillar adenopathy present.    She has no cervical adenopathy.  Neurological: She is alert and oriented to person, place, and time.  Psychiatric: She has a normal mood and affect. Her speech is normal.   Results for orders placed or performed in visit on 05/09/14  POCT rapid strep A    Result Value Ref Range   Rapid Strep A Screen Negative Negative      Assessment & Plan:   [age] yof with no pertinent PMH presents with 5 day h/o sore throat.  Sore throat - Plan: POCT rapid strep A --normal vitals today --neg for strep, cx sent --home care instructions given --rtc 3-5 if not improving, sooner if worse  Donnajean Lopesodd M. Gaetana Kawahara, PA-C Physician Assistant-Certified [facility]  05/09/2014 11:38 AM

## 2014-05-09 NOTE — Telephone Encounter (Signed)
[facility] - Client TELEPHONE ADVICE RECORD [facility] Patient Name: Deanna CoffinSHENIKA Richards Gender: Female DOB: 04/27/1986 Age: [age] Y 8 M 7 D Return Phone Number: [phone] (Primary) Address: [place]Zip[place] [id number] Client [facility] - Client Client Site [facility] - Night Physician Rene PaciLeschber, Valerie Contact Type Call Call Type Triage / Clinical Relationship To Patient Self Return Phone Number [phone] (Primary) Chief Complaint Sore Throat Initial Comment Caller states having symptoms of strep PreDisposition Call Doctor Nurse Assessment Nurse: Elijah Birkaldwell, RN, Stark BrayLynda Date/Time Lamount Cohen(Eastern Time): 05/09/2014 8:17:42 AM Confirm and document reason for call. If symptomatic, describe symptoms. ---Caller states having symptoms of strep, symptoms started Wed. last week., throat feels raw, blood in mucus from throat. Fever last week. Headache also. Has the patient traveled out of the country within the last 30 days? ---Not Applicable Does the patient require triage? ---Yes Related visit to physician within the last 2 weeks? ---No Does the PT have any chronic conditions? (i.e. diabetes, asthma, etc.) ---No Did the patient indicate they were pregnant? ---No Guidelines Guideline Title Affirmed Question Affirmed Notes Nurse Date/Time (Eastern Time) Sore Throat Fever present > 3 days (72 hours) Elijah Birkaldwell, RN, Lynda 05/09/2014 8:20:04 AM Disp. Time Lamount Cohen(Eastern Time) Disposition Final User 05/09/2014 8:26:59 AM See Physician within 24 Hours Yes Elijah Birkaldwell, RN, Irene ShipperLynda Caller Understands: Yes Disagree/Comply: Comply Care Advice Given Per Guideline SEE PHYSICIAN WITHIN 24 HOURS: * IF OFFICE WILL BE OPEN: You need to be examined within the next 24 hours. Call your doctor when the office opens, and make an appointment. SORE THROAT - For relief of sore throat: * Sip warm chicken broth PLEASE NOTE: All timestamps  contained within this report are represented as [place] Standard Time. CONFIDENTIALTY NOTICE: This fax transmission is intended only for the addressee. It contains information that is legally privileged, confidential or otherwise protected from use or disclosure. If you are not the intended recipient, you are strictly prohibited from reviewing, disclosing, copying using or disseminating any of this information or taking any action in reliance on or regarding this information. If you have received this fax in error, please notify us immediately by telephone so that we can arrange for its return to us. Phone: [phone], Toll-Free: [phone], Fax: [phone] Page: 2 of 2 Call Id: [id number] Care Advice Given Per Guideline or apple juice. * Suck on hard candy or a throat lozenge (OTC). * Gargle with warm salt water four times a day. To make salt water, put 1/2 teaspoon of salt in 8 oz (240 ml) of warm water. CARE ADVICE given per Sore Throat (Adult) guideline. CALL BACK IF: * You become worse. SOFT DIET: * Eat a soft diet. Cold drinks, popsicles, and milk shakes are especially good. Avoid citrus fruits. * Drink plenty of liquids so as to avoid dehydration (8-12 eight oz glasses each day). PAIN OR FEVER MEDICINES: * For pain and fever relief, take acetaminophen or ibuprofen. * Treat fevers above 101 F (38.3 C). * The goal of fever therapy is to bring the fever down to a comfortable level. Remember that fever medicine usually lowers fever 2-3 F (1-1.5 C). After Care Instructions Given Call Event Type User Date / Time Description Comments User: Lily LovingsLynda, Caldwell, RN Date/Time Lamount Cohen(Eastern Time): 05/09/2014 8:28:07 AM Caller would like to be seen today if possible in the office for ongoing symptoms, feels she is getting worse & may have strep throat. This contact # is the best to reach her &  she may call to get appt. Referrals REFERRED TO PCP OFFICE

## 2014-05-11 LAB — CULTURE, GROUP A STREP: Organism ID, Bacteria: NORMAL

## 2014-05-12 ENCOUNTER — Ambulatory Visit (INDEPENDENT_AMBULATORY_CARE_PROVIDER_SITE_OTHER): Payer: 59 | Admitting: Internal Medicine

## 2014-05-12 ENCOUNTER — Encounter: Payer: Self-pay | Admitting: Internal Medicine

## 2014-05-12 VITALS — BP 140/90 | HR 98 | Temp 98.9°F | Ht 62.0 in | Wt 230.1 lb

## 2014-05-12 DIAGNOSIS — J209 Acute bronchitis, unspecified: Secondary | ICD-10-CM | POA: Insufficient documentation

## 2014-05-12 DIAGNOSIS — R062 Wheezing: Secondary | ICD-10-CM | POA: Insufficient documentation

## 2014-05-12 DIAGNOSIS — I10 Essential (primary) hypertension: Secondary | ICD-10-CM

## 2014-05-12 MED ORDER — HYDROCODONE-HOMATROPINE 5-1.5 MG/5ML PO SYRP: 5.0000 mL | ORAL_SOLUTION | Freq: Four times a day (QID) | ORAL | Status: DC | PRN

## 2014-05-12 MED ORDER — PREDNISONE 10 MG PO TABS: ORAL_TABLET | ORAL | Status: DC

## 2014-05-12 MED ORDER — FLUCONAZOLE 150 MG PO TABS: ORAL_TABLET | ORAL | Status: DC

## 2014-05-12 MED ORDER — AZITHROMYCIN 250 MG PO TABS: ORAL_TABLET | ORAL | Status: DC

## 2014-05-12 MED ORDER — ZOLPIDEM TARTRATE 10 MG PO TABS: 10.0000 mg | ORAL_TABLET | Freq: Every evening | ORAL | Status: DC | PRN

## 2014-05-12 MED ORDER — METHYLPREDNISOLONE ACETATE 80 MG/ML IJ SUSP
80.0000 mg | Freq: Once | INTRAMUSCULAR | Status: AC
  Administered 2014-05-12: 80 mg via INTRAMUSCULAR

## 2014-05-12 NOTE — Progress Notes (Signed)
   Subjective:    Patient ID: Deanna QuillShenika M Richards, female    DOB: 04/09/1986, 28 y.o.   MRN: [id number]  HPI  Here with acute onset mild to mod 2-3 days ST, HA, general weakness and malaise, with prod cough greenish sputum, but Pt denies chest pain, increased sob or doe, wheezing, orthopnea, PND, increased LE swelling, palpitations, dizziness or syncope, except for onset wheezing last PM with mild DOE  Past Medical History  Diagnosis Date  . PCOS (polycystic ovarian syndrome)     no current med.  . Carpal tunnel syndrome of right wrist 10/2013   Past Surgical History  Procedure Laterality Date  . No past surgeries    . Carpal tunnel release Right 11/05/2013    Procedure: CARPAL TUNNEL RELEASE ENDOSCOPIC RIGHT WRIST;  Surgeon: Sheral Apleyimothy D Murphy, MD;  Location: [facility];  Service: Orthopedics;  Laterality: Right;    reports that she has been smoking Cigars.  She has never used smokeless tobacco. She reports that she drinks alcohol. She reports that she does not use illicit drugs. family history is not on file. She was adopted. No Known Allergies Current Outpatient Prescriptions on File Prior to Visit  Medication Sig Dispense Refill  . cyclobenzaprine (FLEXERIL) 5 MG tablet Take 1 tablet (5 mg total) by mouth every 8 (eight) hours as needed for muscle spasms. 30 tablet 1  . folic acid (FOLVITE) 1 MG tablet Take 1 mg by mouth daily.    . norgestimate-ethinyl estradiol (MONONESSA) 0.25-35 MG-MCG tablet Take 1 tablet by mouth daily.     No current facility-administered medications on file prior to visit.   Review of Systems  Constitutional: Negative for unusual diaphoresis or other sweats  HENT: Negative for ringing in ear Eyes: Negative for double vision or worsening visual disturbance.  Respiratory: Negative for choking and stridor.   Gastrointestinal: Negative for vomiting or other signifcant bowel change Genitourinary: Negative for hematuria or decreased urine volume.    Musculoskeletal: Negative for other MSK pain or swelling Skin: Negative for color change and worsening wound.  Neurological: Negative for tremors and numbness other than noted  Psychiatric/Behavioral: Negative for decreased concentration or agitation other than above       Objective:   Physical Exam BP 140/90 mmHg  Pulse 98  Temp(Src) 98.9 F (37.2 C) (Oral)  Ht 5\' 2"  (1.575 m)  Wt 230 lb 2 oz (104.384 kg)  BMI 42.08 kg/m2  SpO2 96%  LMP 04/29/2014 VS noted, mild ill Constitutional: Pt appears well-developed, well-nourished.  HENT: Head: NCAT.  Right Ear: External ear normal.  Left Ear: External ear normal.  Eyes: . Pupils are equal, round, and reactive to light. Conjunctivae and EOM are normal Bilat tm's with mild erythema.  Max sinus areas mild tender.  Pharynx with mild erythema, no exudate Neck: Normal range of motion. Neck supple.  Cardiovascular: Normal rate and regular rhythm.   Pulmonary/Chest: Effort normal and breath sounds decreased with few bilat wheeze Neurological: Pt is alert. Not confused , motor grossly intact Skin: Skin is warm. No rash Psychiatric: Pt behavior is normal. No agitation.     Assessment & Plan:

## 2014-05-12 NOTE — Progress Notes (Signed)
Pre visit review using our clinic review tool, if applicable. No additional management support is needed unless otherwise documented below in the visit note. 

## 2014-05-12 NOTE — Patient Instructions (Signed)
You had the steroid shot today  Please take all new medication as prescribed - the antibiotic, cough medicine, and prednisone  You also have the diflucan, as well as the ambien for sleep  Please continue all other medications as before  Please have the pharmacy call with any other refills you may need.  Please keep your appointments with your specialists as you may have planned

## 2014-05-13 NOTE — Assessment & Plan Note (Signed)
stable overall by history and exam, recent data reviewed with pt, and pt to continue medical treatment as before,  to f/u any worsening symptoms or concerns BP Readings from Last 3 Encounters:  05/12/14 140/90  05/09/14 132/88  11/05/13 150/96

## 2014-05-13 NOTE — Assessment & Plan Note (Signed)
Mild to mod, for antibx course,  to f/u any worsening symptoms or concerns 

## 2014-05-13 NOTE — Assessment & Plan Note (Signed)
Mild to mod, for depomedrol IM, predpac asd, to f/u any worsening symptoms or concerns 

## 2014-08-08 ENCOUNTER — Ambulatory Visit (INDEPENDENT_AMBULATORY_CARE_PROVIDER_SITE_OTHER): Payer: 59 | Admitting: Internal Medicine

## 2014-08-08 ENCOUNTER — Other Ambulatory Visit (INDEPENDENT_AMBULATORY_CARE_PROVIDER_SITE_OTHER): Payer: 59

## 2014-08-08 ENCOUNTER — Encounter: Payer: Self-pay | Admitting: Internal Medicine

## 2014-08-08 VITALS — BP 140/82 | HR 80 | Temp 98.6°F | Resp 20 | Ht 62.0 in | Wt 223.0 lb

## 2014-08-08 DIAGNOSIS — E663 Overweight: Secondary | ICD-10-CM

## 2014-08-08 DIAGNOSIS — M549 Dorsalgia, unspecified: Secondary | ICD-10-CM | POA: Diagnosis not present

## 2014-08-08 DIAGNOSIS — R1031 Right lower quadrant pain: Secondary | ICD-10-CM

## 2014-08-08 DIAGNOSIS — Z0000 Encounter for general adult medical examination without abnormal findings: Secondary | ICD-10-CM | POA: Diagnosis not present

## 2014-08-08 DIAGNOSIS — F17200 Nicotine dependence, unspecified, uncomplicated: Secondary | ICD-10-CM

## 2014-08-08 DIAGNOSIS — I10 Essential (primary) hypertension: Secondary | ICD-10-CM | POA: Diagnosis not present

## 2014-08-08 DIAGNOSIS — G8929 Other chronic pain: Secondary | ICD-10-CM

## 2014-08-08 DIAGNOSIS — M25572 Pain in left ankle and joints of left foot: Secondary | ICD-10-CM

## 2014-08-08 DIAGNOSIS — Z720 Tobacco use: Secondary | ICD-10-CM

## 2014-08-08 DIAGNOSIS — M25571 Pain in right ankle and joints of right foot: Secondary | ICD-10-CM

## 2014-08-08 LAB — HEPATIC FUNCTION PANEL
ALK PHOS: 39 U/L (ref 39–117)
ALT: 25 U/L (ref 0–35)
AST: 21 U/L (ref 0–37)
Albumin: 3.9 g/dL (ref 3.5–5.2)
BILIRUBIN DIRECT: 0.1 mg/dL (ref 0.0–0.3)
TOTAL PROTEIN: 6.9 g/dL (ref 6.0–8.3)
Total Bilirubin: 0.2 mg/dL (ref 0.2–1.2)

## 2014-08-08 LAB — URINALYSIS, ROUTINE W REFLEX MICROSCOPIC
BILIRUBIN URINE: NEGATIVE
HGB URINE DIPSTICK: NEGATIVE
Ketones, ur: NEGATIVE
Leukocytes, UA: NEGATIVE
Nitrite: NEGATIVE
RBC / HPF: NONE SEEN (ref 0–?)
Specific Gravity, Urine: >=1.03 — AB (ref 1.000–1.030)
TOTAL PROTEIN, URINE-UPE24: NEGATIVE
Urine Glucose: NEGATIVE
Urobilinogen, UA: 0.2 (ref 0.0–1.0)
pH: 6 (ref 5.0–8.0)

## 2014-08-08 LAB — CBC WITH DIFFERENTIAL/PLATELET
Basophils Absolute: 0 10*3/uL (ref 0.0–0.1)
Basophils Relative: 0.4 % (ref 0.0–3.0)
EOS PCT: 1 % (ref 0.0–5.0)
Eosinophils Absolute: 0.1 10*3/uL (ref 0.0–0.7)
HCT: 40.1 % (ref 36.0–46.0)
HEMOGLOBIN: 13.8 g/dL (ref 12.0–15.0)
Lymphocytes Relative: 25.6 % (ref 12.0–46.0)
Lymphs Abs: 1.6 10*3/uL (ref 0.7–4.0)
MCHC: 34.3 g/dL (ref 30.0–36.0)
MCV: 89.5 fl (ref 78.0–100.0)
MONOS PCT: 4.5 % (ref 3.0–12.0)
Monocytes Absolute: 0.3 10*3/uL (ref 0.1–1.0)
NEUTROS PCT: 68.5 % (ref 43.0–77.0)
Neutro Abs: 4.4 10*3/uL (ref 1.4–7.7)
PLATELETS: 255 10*3/uL (ref 150.0–400.0)
RBC: 4.48 Mil/uL (ref 3.87–5.11)
RDW: 12.7 % (ref 11.5–15.5)
WBC: 6.4 10*3/uL (ref 4.0–10.5)

## 2014-08-08 LAB — LIPID PANEL
CHOLESTEROL: 123 mg/dL (ref 0–200)
HDL: 56.2 mg/dL (ref >39.00)
LDL CALC: 49 mg/dL (ref 0–99)
NonHDL: 66.8
TRIGLYCERIDES: 87 mg/dL (ref 0.0–149.0)
Total CHOL/HDL Ratio: 2
VLDL: 17.4 mg/dL (ref 0.0–40.0)

## 2014-08-08 LAB — BASIC METABOLIC PANEL
BUN: 16 mg/dL (ref 6–23)
CALCIUM: 9.4 mg/dL (ref 8.4–10.5)
CO2: 27 mEq/L (ref 19–32)
Chloride: 106 mEq/L (ref 96–112)
Creatinine, Ser: 0.72 mg/dL (ref 0.40–1.20)
GFR: 124.1 mL/min (ref >60.00)
GLUCOSE: 94 mg/dL (ref 70–99)
Potassium: 4.2 mEq/L (ref 3.5–5.1)
Sodium: 139 mEq/L (ref 135–145)

## 2014-08-08 LAB — TSH: TSH: 0.79 u[IU]/mL (ref 0.35–4.50)

## 2014-08-08 MED ORDER — ZOLPIDEM TARTRATE 10 MG PO TABS: 10.0000 mg | ORAL_TABLET | Freq: Every evening | ORAL | Status: DC | PRN

## 2014-08-08 MED ORDER — CYCLOBENZAPRINE HCL 5 MG PO TABS: 5.0000 mg | ORAL_TABLET | Freq: Three times a day (TID) | ORAL | Status: AC | PRN

## 2014-08-08 NOTE — Assessment & Plan Note (Signed)
BP Readings from Last 3 Encounters:  08/08/14 140/82  05/12/14 140/90  05/09/14 132/88   Patient reports exacerbation by white coat syndrome and anxiety Patient acknowledges probable family history of hypertension in her mother (patient is adopted and knows little about her genetic history) Patient to monitor blood pressure outside of position office and call if greater than 140/85 to initiate treatment as needed

## 2014-08-08 NOTE — Progress Notes (Signed)
Subjective:    Patient ID: Deanna Richards, female    DOB: 03/13/1986, 29 y.o.   MRN: [id number]  HPI  patient is here today for annual physical. Patient feels well in general. Reviewed chronic conditions, current concerns and interval events   Past Medical History  Diagnosis Date  . PCOS (polycystic ovarian syndrome)     no current med.  . Carpal tunnel syndrome of right wrist 10/2013   Family History  Problem Relation Age of Onset  . Adopted: Yes  . Hypertension Mother    History  Substance Use Topics  . Smoking status: Current Every Day Smoker -- 5 years    Types: Cigars  . Smokeless tobacco: Never Used     Comment: 1-2 Black and Mild/day  . Alcohol Use: 0.0 oz/week    0 Standard drinks or equivalent per week     Comment: weekends    Review of Systems  Constitutional: Negative for fatigue and unexpected weight change.  Respiratory: Negative for cough, shortness of breath and wheezing.   Cardiovascular: Negative for chest pain, palpitations and leg swelling.  Gastrointestinal: Positive for abdominal pain (RUQ, chronic and intermittent - worse with core exercise/exertion effort). Negative for nausea, vomiting, diarrhea, blood in stool and abdominal distention.  Musculoskeletal: Positive for back pain (intermittent, chronic LBP, unimproved with PT or chiropractice eval - no radiation into BLE) and arthralgias (B ankles - x 3 mo, intermittetn since work outs intensified). Negative for joint swelling.  Neurological: Negative for dizziness, weakness, light-headedness and headaches.  Psychiatric/Behavioral: Negative for dysphoric mood. The patient is not nervous/anxious.   All other systems reviewed and are negative.      Objective:    Physical Exam  Constitutional: She is oriented to person, place, and time. She appears well-developed and well-nourished. No distress.  obese  HENT:  Head: Normocephalic and atraumatic.  Right Ear: External ear normal.  Left Ear:  External ear normal.  Nose: Nose normal.  Mouth/Throat: Oropharynx is clear and moist. No oropharyngeal exudate.  Eyes: EOM are normal. Pupils are equal, round, and reactive to light. Right eye exhibits no discharge. Left eye exhibits no discharge. No scleral icterus.  Neck: Normal range of motion. Neck supple. No JVD present. No tracheal deviation present. No thyromegaly present.  Cardiovascular: Normal rate, regular rhythm, normal heart sounds and intact distal pulses.  Exam reveals no friction rub.   No murmur heard. Pulmonary/Chest: Effort normal and breath sounds normal. No respiratory distress. She has no wheezes. She has no rales. She exhibits no tenderness.  Abdominal: Soft. Bowel sounds are normal. She exhibits no distension and no mass. There is no tenderness. There is no rebound and no guarding.  Genitourinary:  Defer to gyn  Musculoskeletal: Normal range of motion. She exhibits no edema or tenderness.  No gross deformities. Back: full range of motion of thoracic and lumbar spine. Non tender to palpation. Negative straight leg raise. DTR's are symmetrically intact. Sensation intact in all dermatomes of the lower extremities. Full strength to manual muscle testing. patient is able to heel toe walk without difficulty and ambulates with antalgic gait.  Lymphadenopathy:    She has no cervical adenopathy.  Neurological: She is alert and oriented to person, place, and time. She has normal reflexes. No cranial nerve deficit.  Skin: Skin is warm and dry. No rash noted. She is not diaphoretic. No erythema.  Psychiatric: She has a normal mood and affect. Her behavior is normal. Judgment and thought content normal.  Nursing note and vitals reviewed.   BP 140/82 mmHg  Pulse 80  Temp(Src) 98.6 F (37 C) (Oral)  Resp 20  Ht  (1.575 m)  Wt 223 lb (101.152 kg)  BMI 40.78 kg/m2  SpO2 97% Wt Readings from Last 3 Encounters:  08/08/14 223 lb (101.152 kg)  05/12/14 230 lb 2 oz (104.384  kg)  05/09/14 231 lb 3.2 oz (104.872 kg)     Lab Results  Component Value Date   WBC 6.4 02/22/2010   HGB 15.9* 11/05/2013   HCT 39.0 02/22/2010   PLT 238.0 02/22/2010   GLUCOSE 78 02/22/2010   CHOL 105 02/22/2010   TRIG 79.0 02/22/2010   HDL 42.60 02/22/2010   LDLCALC 47 02/22/2010   ALT 21 02/22/2010   AST 22 02/22/2010   NA 139 02/22/2010   K 4.3 02/22/2010   CL 108 02/22/2010   CREATININE 0.7 02/22/2010   BUN 13 02/22/2010   CO2 25 02/22/2010   TSH 1.71 02/22/2010    No results found.     Assessment & Plan:   CPX/z00.00 - Patient has been counseled on age-appropriate routine health concerns for screening and prevention. These are reviewed and up-to-date. Immunizations are up-to-date or declined. Labs ordered and reviewed.  Bilateral ankle pain, no swelling or specific injury. Relates to increased intensity of workouts. No gross deformity on exam. Refer to sports medicine for evaluation and training/education on same. Recommend over-the-counter anti-inflammatory for 72 hours, then as needed  Intermittent right lower quadrant pain. Prior pelvic US evaluation 2011 with prominent ovaries, symptoms unchanged with oral contraception hormone treatment. Check labs. If labs unremarkable, will plan abdominal ultrasound to evaluate same  Problem List Items Addressed This Visit    Chronic back pain    Hx same - stress induced as well as overexertion Neuro exam benign -  Muscle relaxer as needed (refill today) and emphasized healthy back exercises + stress mgmt Has followed with chiropractor and physical therapy, reports no improvement Eval by sports medicine, refer for same today Also recommended anti-inflammatory such as over-the-counter Aleve once daily for the next 3-4 days, then as needed       Relevant Medications   cyclobenzaprine (FLEXERIL) tablet   Other Relevant Orders   Ambulatory referral to Sports Medicine   Essential hypertension    BP Readings from Last 3  Encounters:  08/08/14 140/82  05/12/14 140/90  05/09/14 132/88   Patient reports exacerbation by white coat syndrome and anxiety Patient acknowledges probable family history of hypertension in her mother (patient is adopted and knows little about her genetic history) Patient to monitor blood pressure outside of position office and call if greater than 140/85 to initiate treatment as needed      Overweight    Wt Readings from Last 3 Encounters:  08/08/14 223 lb (101.152 kg)  05/12/14 230 lb 2 oz (104.384 kg)  05/09/14 231 lb 3.2 oz (104.872 kg)   The patient is asked to make an attempt to improve diet and exercise patterns to aid in medical management of this problem.  Consider medication such as Contrave, Belviq, Qysmia or "victoza" injections as needed in future      SMOKER    5 minutes today spent counseling patient on unhealthy effects of continued tobacco abuse and encouragement of cessation including medical options available to help the patient quit smoking.        Other Visit Diagnoses    Routine general medical examination at a health care facility    -  Primary    Relevant Orders    Basic metabolic panel    CBC with Differential/Platelet    Hepatic function panel    Lipid panel    TSH    Urinalysis, Routine w reflex microscopic    Bilateral ankle joint pain        Relevant Orders    Ambulatory referral to Sports Medicine    Chronic RLQ pain            Rene Paci, MD

## 2014-08-08 NOTE — Addendum Note (Signed)
Addended by: Rene PaciLESCHBER, Erving Sassano A on: 08/08/2014 12:37 PM   Modules accepted: Orders, SmartSet

## 2014-08-08 NOTE — Assessment & Plan Note (Signed)
5 minutes today spent counseling patient on unhealthy effects of continued tobacco abuse and encouragement of cessation including medical options available to help the patient quit smoking. 

## 2014-08-08 NOTE — Progress Notes (Signed)
Pre visit review using our clinic review tool, if applicable. No additional management support is needed unless otherwise documented below in the visit note. 

## 2014-08-08 NOTE — Patient Instructions (Addendum)
It was good to see you today.  We have reviewed your prior records including labs and tests today  Health Maintenance reviewed - all recommended immunizations and age-appropriate screenings are up-to-date.  Test(s) ordered today. Your results will be released to [place] (or called to you) after review, usually within 72hours after test completion. If any changes need to be made, you will be notified at that same time.  Medications reviewed and updated, no changes recommended at this time. Refill on medication(s) as discussed today.  Make an appointment to see Dr. Tamala Julian before you go  Please schedule followup in 12 months for annual exam and labs, call sooner if problems.  Health Maintenance Adopting a healthy lifestyle and getting preventive care can go a long way to promote health and wellness. Talk with your health care provider about what schedule of regular examinations is right for you. This is a good chance for you to check in with your provider about disease prevention and staying healthy. In between checkups, there are plenty of things you can do on your own. Experts have done a lot of research about which lifestyle changes and preventive measures are most likely to keep you healthy. Ask your health care provider for more information. WEIGHT AND DIET  Eat a healthy diet  Be sure to include plenty of vegetables, fruits, low-fat dairy products, and lean protein.  Do not eat a lot of foods high in solid fats, added sugars, or salt.  Get regular exercise. This is one of the most important things you can do for your health.  Most adults should exercise for at least 150 minutes each week. The exercise should increase your heart rate and make you sweat (moderate-intensity exercise).  Most adults should also do strengthening exercises at least twice a week. This is in addition to the moderate-intensity exercise.  Maintain a healthy weight  Body mass index (BMI) is a measurement that  can be used to identify possible weight problems. It estimates body fat based on height and weight. Your health care provider can help determine your BMI and help you achieve or maintain a healthy weight.  For females 34 years of age and older:   A BMI below 18.5 is considered underweight.  A BMI of 18.5 to 24.9 is normal.  A BMI of 25 to 29.9 is considered overweight.  A BMI of 30 and above is considered obese.  Watch levels of cholesterol and blood lipids  You should start having your blood tested for lipids and cholesterol at 28 years of age, then have this test every 5 years.  You may need to have your cholesterol levels checked more often if:  Your lipid or cholesterol levels are high.  You are older than 28 years of age.  You are at high risk for heart disease.  CANCER SCREENING   Lung Cancer  Lung cancer screening is recommended for adults 61-3 years old who are at high risk for lung cancer because of a history of smoking.  A yearly low-dose CT scan of the lungs is recommended for people who:  Currently smoke.  Have quit within the past 15 years.  Have at least a 30-pack-year history of smoking. A pack year is smoking an average of one pack of cigarettes a day for 1 year.  Yearly screening should continue until it has been 15 years since you quit.  Yearly screening should stop if you develop a health problem that would prevent you from having lung cancer  treatment.  Breast Cancer  Practice breast self-awareness. This means understanding how your breasts normally appear and feel.  It also means doing regular breast self-exams. Let your health care provider know about any changes, no matter how small.  If you are in your 20s or 30s, you should have a clinical breast exam (CBE) by a health care provider every 1-3 years as part of a regular health exam.  If you are 55 or older, have a CBE every year. Also consider having a breast X-ray (mammogram) every  year.  If you have a family history of breast cancer, talk to your health care provider about genetic screening.  If you are at high risk for breast cancer, talk to your health care provider about having an MRI and a mammogram every year.  Breast cancer gene (BRCA) assessment is recommended for women who have family members with BRCA-related cancers. BRCA-related cancers include:  Breast.  Ovarian.  Tubal.  Peritoneal cancers.  Results of the assessment will determine the need for genetic counseling and BRCA1 and BRCA2 testing. Cervical Cancer Routine pelvic examinations to screen for cervical cancer are no longer recommended for nonpregnant women who are considered low risk for cancer of the pelvic organs (ovaries, uterus, and vagina) and who do not have symptoms. A pelvic examination may be necessary if you have symptoms including those associated with pelvic infections. Ask your health care provider if a screening pelvic exam is right for you.   The Pap test is the screening test for cervical cancer for women who are considered at risk.  If you had a hysterectomy for a problem that was not cancer or a condition that could lead to cancer, then you no longer need Pap tests.  If you are older than 65 years, and you have had normal Pap tests for the past 10 years, you no longer need to have Pap tests.  If you have had past treatment for cervical cancer or a condition that could lead to cancer, you need Pap tests and screening for cancer for at least 20 years after your treatment.  If you no longer get a Pap test, assess your risk factors if they change (such as having a new sexual partner). This can affect whether you should start being screened again.  Some women have medical problems that increase their chance of getting cervical cancer. If this is the case for you, your health care provider may recommend more frequent screening and Pap tests.  The human papillomavirus (HPV) test is  another test that may be used for cervical cancer screening. The HPV test looks for the virus that can cause cell changes in the cervix. The cells collected during the Pap test can be tested for HPV.  The HPV test can be used to screen women 1 years of age and older. Getting tested for HPV can extend the interval between normal Pap tests from three to five years.  An HPV test also should be used to screen women of any age who have unclear Pap test results.  After 28 years of age, women should have HPV testing as often as Pap tests.  Colorectal Cancer  This type of cancer can be detected and often prevented.  Routine colorectal cancer screening usually begins at 28 years of age and continues through 28 years of age.  Your health care provider may recommend screening at an earlier age if you have risk factors for colon cancer.  Your health care provider may also  recommend using home test kits to check for hidden blood in the stool.  A small camera at the end of a tube can be used to examine your colon directly (sigmoidoscopy or colonoscopy). This is done to check for the earliest forms of colorectal cancer.  Routine screening usually begins at age 19.  Direct examination of the colon should be repeated every 5-10 years through 28 years of age. However, you may need to be screened more often if early forms of precancerous polyps or small growths are found. Skin Cancer  Check your skin from head to toe regularly.  Tell your health care provider about any new moles or changes in moles, especially if there is a change in a mole's shape or color.  Also tell your health care provider if you have a mole that is larger than the size of a pencil eraser.  Always use sunscreen. Apply sunscreen liberally and repeatedly throughout the day.  Protect yourself by wearing long sleeves, pants, a wide-brimmed hat, and sunglasses whenever you are outside. HEART DISEASE, DIABETES, AND HIGH BLOOD PRESSURE    Have your blood pressure checked at least every 1-2 years. High blood pressure causes heart disease and increases the risk of stroke.  If you are between 44 years and 43 years old, ask your health care provider if you should take aspirin to prevent strokes.  Have regular diabetes screenings. This involves taking a blood sample to check your fasting blood sugar level.  If you are at a normal weight and have a low risk for diabetes, have this test once every three years after 28 years of age.  If you are overweight and have a high risk for diabetes, consider being tested at a younger age or more often. PREVENTING INFECTION  Hepatitis B  If you have a higher risk for hepatitis B, you should be screened for this virus. You are considered at high risk for hepatitis B if:  You were born in a country where hepatitis B is common. Ask your health care provider which countries are considered high risk.  Your parents were born in a high-risk country, and you have not been immunized against hepatitis B (hepatitis B vaccine).  You have HIV or AIDS.  You use needles to inject street drugs.  You live with someone who has hepatitis B.  You have had sex with someone who has hepatitis B.  You get hemodialysis treatment.  You take certain medicines for conditions, including cancer, organ transplantation, and autoimmune conditions. Hepatitis C  Blood testing is recommended for:  Everyone born from [birth year] through 1964.  Anyone with known risk factors for hepatitis C. Sexually transmitted infections (STIs)  You should be screened for sexually transmitted infections (STIs) including gonorrhea and chlamydia if:  You are sexually active and are younger than 28 years of age.  You are older than 28 years of age and your health care provider tells you that you are at risk for this type of infection.  Your sexual activity has changed since you were last screened and you are at an increased risk for  chlamydia or gonorrhea. Ask your health care provider if you are at risk.  If you do not have HIV, but are at risk, it may be recommended that you take a prescription medicine daily to prevent HIV infection. This is called pre-exposure prophylaxis (PrEP). You are considered at risk if:  You are sexually active and do not regularly use condoms or know the HIV status  of your partner(s).  You take drugs by injection.  You are sexually active with a partner who has HIV. Talk with your health care provider about whether you are at high risk of being infected with HIV. If you choose to begin PrEP, you should first be tested for HIV. You should then be tested every 3 months for as long as you are taking PrEP.  PREGNANCY   If you are premenopausal and you may become pregnant, ask your health care provider about preconception counseling.  If you may become pregnant, take 400 to 800 micrograms (mcg) of folic acid every day.  If you want to prevent pregnancy, talk to your health care provider about birth control (contraception). OSTEOPOROSIS AND MENOPAUSE   Osteoporosis is a disease in which the bones lose minerals and strength with aging. This can result in serious bone fractures. Your risk for osteoporosis can be identified using a bone density scan.  If you are 39 years of age or older, or if you are at risk for osteoporosis and fractures, ask your health care provider if you should be screened.  Ask your health care provider whether you should take a calcium or vitamin D supplement to lower your risk for osteoporosis.  Menopause may have certain physical symptoms and risks.  Hormone replacement therapy may reduce some of these symptoms and risks. Talk to your health care provider about whether hormone replacement therapy is right for you.  HOME CARE INSTRUCTIONS   Schedule regular health, dental, and eye exams.  Stay current with your immunizations.   Do not use any tobacco products  including cigarettes, chewing tobacco, or electronic cigarettes.  If you are pregnant, do not drink alcohol.  If you are breastfeeding, limit how much and how often you drink alcohol.  Limit alcohol intake to no more than 1 drink per day for nonpregnant women. One drink equals 12 ounces of beer, 5 ounces of wine, or 1 ounces of hard liquor.  Do not use street drugs.  Do not share needles.  Ask your health care provider for help if you need support or information about quitting drugs.  Tell your health care provider if you often feel depressed.  Tell your health care provider if you have ever been abused or do not feel safe at home. Document Released: 11/26/2010 Document Revised: 09/27/2013 Document Reviewed: 04/14/2013 [facility] Patient Information 2015 [place], [place]. This information is not intended to replace advice given to you by your health care provider. Make sure you discuss any questions you have with your health care provider.

## 2014-08-08 NOTE — Assessment & Plan Note (Signed)
Hx same - stress induced as well as overexertion Neuro exam benign -  Muscle relaxer as needed (refill today) and emphasized healthy back exercises + stress mgmt Has followed with chiropractor and physical therapy, reports no improvement Eval by sports medicine, refer for same today Also recommended anti-inflammatory such as over-the-counter Aleve once daily for the next 3-4 days, then as needed

## 2014-08-08 NOTE — Assessment & Plan Note (Signed)
Wt Readings from Last 3 Encounters:  08/08/14 223 lb (101.152 kg)  05/12/14 230 lb 2 oz (104.384 kg)  05/09/14 231 lb 3.2 oz (104.872 kg)   The patient is asked to make an attempt to improve diet and exercise patterns to aid in medical management of this problem.  Consider medication such as Contrave, Belviq, Qysmia or "victoza" injections as needed in future

## 2014-08-16 ENCOUNTER — Ambulatory Visit
Admission: RE | Admit: 2014-08-16 | Discharge: 2014-08-16 | Disposition: A | Discharge status: Home / Self Care | Payer: 59 | Source: Ambulatory Visit | Attending: Internal Medicine | Admitting: Internal Medicine

## 2014-08-16 DIAGNOSIS — G8929 Other chronic pain: Secondary | ICD-10-CM

## 2014-08-16 DIAGNOSIS — R1031 Right lower quadrant pain: Principal | ICD-10-CM

## 2014-08-22 ENCOUNTER — Ambulatory Visit (INDEPENDENT_AMBULATORY_CARE_PROVIDER_SITE_OTHER): Payer: 59 | Admitting: Family Medicine

## 2014-08-22 ENCOUNTER — Encounter: Payer: Self-pay | Admitting: Family Medicine

## 2014-08-22 ENCOUNTER — Ambulatory Visit (INDEPENDENT_AMBULATORY_CARE_PROVIDER_SITE_OTHER)
Admission: RE | Admit: 2014-08-22 | Discharge: 2014-08-22 | Disposition: A | Discharge status: Home / Self Care | Payer: 59 | Source: Ambulatory Visit | Attending: Family Medicine | Admitting: Family Medicine

## 2014-08-22 ENCOUNTER — Other Ambulatory Visit (INDEPENDENT_AMBULATORY_CARE_PROVIDER_SITE_OTHER): Payer: 59

## 2014-08-22 VITALS — BP 134/86 | HR 87 | Ht 62.0 in | Wt 222.0 lb

## 2014-08-22 DIAGNOSIS — M76829 Posterior tibial tendinitis, unspecified leg: Secondary | ICD-10-CM

## 2014-08-22 DIAGNOSIS — M6789 Other specified disorders of synovium and tendon, multiple sites: Secondary | ICD-10-CM

## 2014-08-22 DIAGNOSIS — M25572 Pain in left ankle and joints of left foot: Secondary | ICD-10-CM

## 2014-08-22 DIAGNOSIS — G8929 Other chronic pain: Secondary | ICD-10-CM

## 2014-08-22 DIAGNOSIS — M549 Dorsalgia, unspecified: Secondary | ICD-10-CM

## 2014-08-22 NOTE — Assessment & Plan Note (Signed)
Discussed with patient at this time. We discussed with patient having the anatomy of her feet that this is contributing to all malalignment of her legs as well as back. Patient will try some over-the-counter orthotics we discussed the possibility of custom orthotics. We discussed home exercises and patient did work with Event organiserathletic trainer today. We discussed icing regimen as well as topical anti-inflammatories. Patient and will come back and see me again in 3 weeks for further evaluation and treatment.

## 2014-08-22 NOTE — Patient Instructions (Signed)
Good to see you Ice bath 10-20 minutes at night Try pennsaid up to 2 times daily when needed Exercises 3 times a week.  Vitamin D 2000 IU daily Spenco orthotics "all around" either online or omega sports See me again in 3 weeks.  We may need to consider orthotics.

## 2014-08-22 NOTE — Assessment & Plan Note (Signed)
Patient's chronic back pain has been greater than 3 months. I do not see any further workup for this previously and we will get x-rays to further evaluate. We discussed home exercises and patient given her some topical anti-inflammatories. Patient did work with Event organiserathletic trainer today. We discussed icing regimen and home exercises. We discussed over-the-counter natural supplementations. Patient also was suggested about possibly losing weight that could be beneficial. Patient and will come back and see me again in 3 weeks. Continued have pain she could be a candidate for osteopathic manipulation of workup was unremarkable.

## 2014-08-22 NOTE — Progress Notes (Signed)
Deanna Richards [address] [place], [place] [address] Phone: [phone] Subjective:    I'm seeing this patient by the request  of:  Rene Paci, MD   CC: Ankle pain. Chronic back pain  [id number] Deanna Richards is a 29 y.o. female coming in with complaint of ankle pain.  Patient states that she has had pain in the bilateral ankles as well as chronic back pain.  Patient describes her ankle pain as something she has noticed since she's been increasing her activity. Patient states that the foreign tensor workouts are the more pain that she has. Patient denies any specific injury and denies any swelling. Patient states that at the end of elongation of pain on the anterior aspect of her ankles. Denies any giving out on her. Patient states it only hurts when she is doing activity. Sometimes can have pain on the posterior aspect of the ankles well. Patient denies any numbness or tingling. Rates the severity of pain a 6 out of 10. Does not happen all the time but seems to be worse with increasing activity.  Patient is also had chronic back pain for quite some time. Patient states that this occurred before the ankle pain. Patient states that it seems to be more of a dull throbbing aching sensation that seems to be localized. Patient has been told that she has an extra curve in her back but she doesn't remember who or wear. Patient denies any radiation down the leg but is wondering if her ankles are from her back. Patient denies any fever, chills, or any abnormal weight loss. Patient does respond somewhat to muscle relaxers and anti-inflammatories. Rates severity of pain is 8 out of 10 when it occurs. Patient can have days when it is pain-free.    Past medical history, social, surgical and family history all reviewed in electronic medical record.   Review of Systems: No headache, visual changes, nausea, vomiting, diarrhea, constipation, dizziness, abdominal pain,  skin rash, fevers, chills, night sweats, weight loss, swollen lymph nodes, body aches, joint swelling, muscle aches, chest pain, shortness of breath, mood changes.   Objective Blood pressure 134/86, pulse 87, height  (1.575 m), weight 222 lb (100.699 kg), SpO2 95 %.  General: No apparent distress alert and oriented x3 mood and affect normal, dressed appropriately.  HEENT: Pupils equal, extraocular movements intact  Respiratory: Patient's speak in full sentences and does not appear short of breath  Cardiovascular: No lower extremity edema, non tender, no erythema  Skin: Warm dry intact with no signs of infection or rash on extremities or on axial skeleton.  Abdomen: Soft nontender  Neuro: Cranial nerves II through XII are intact, neurovascularly intact in all extremities with 2+ DTRs and 2+ pulses.  Lymph: No lymphadenopathy of posterior or anterior cervical chain or axillae bilaterally.  Gait normal with good balance and coordination.  MSK:  Non tender with full range of motion and good stability and symmetric strength and tone of shoulders, elbows, wrist, hip, knee and bilaterally.  Back Exam:  Inspection: Unremarkable  Motion: Flexion 45 deg, Extension 35 deg, Side Bending to 35 deg bilaterally,  Rotation to 45 deg bilaterally  SLR laying: Negative  XSLR laying: Negative  Palpable tenderness: Tender over the paraspinal musculature of the lumbar spine as well as the sacroiliac joints bilaterally. FABER: Positive bilaterally Sensory change: Gross sensation intact to all lumbar and sacral dermatomes.  Reflexes: 2+ at both patellar tendons, 2+ at achilles tendons, Babinski's  downgoing.  Strength at foot  Plantar-flexion: 5/5 Dorsi-flexion: 5/5 Eversion: 5/5 Inversion: 5/5  Leg strength  Quad: 5/5 Hamstring: 5/5 Hip flexor: 4/5 Hip abductors: 4/5  Gait unremarkable.  Ankle: Bilateral No visible erythema or swelling. 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 Callus formation noted over the second metatarsal head bilaterally left greater than right. Range of motion is full in all directions. Strength is 5/5 in all directions. Stable lateral and medial ligaments; squeeze test and kleiger test unremarkable; Talar dome nontender; No pain at base of 5th MT; No tenderness over cuboid; No tenderness over N spot or navicular prominence No tenderness on posterior aspects of lateral and medial malleolus No sign of peroneal tendon subluxations or tenderness to palpation Negative tarsal tunnel tinel's Able to walk 4 steps.  MSK US performed of: Bilateral This study was ordered, performed, and interpreted by Terrilee FilesZach Smith D.O.  Foot/Ankle:   All structures visualized.   Talar dome unremarkable  Ankle mortise without effusion. Peroneus longus and brevis tendons unremarkable on long and transverse views without sheath effusions. Posterior tibialis does have hypoechoic changes bilaterally left good alignment right, flexor hallucis longus, and flexor digitorum longus tendons unremarkable on long and transverse views without sheath effusions. Achilles tendon visualized along length of tendon and unremarkable on long and transverse views without sheath effusion. Anterior Talofibular Ligament and Calcaneofibular Ligaments unremarkable and intact. Deltoid Ligament unremarkable and intact. Plantar fascia intact and without effusion, normal thickness. No increased doppler signal, cap sign, or thickening of tibial cortex. Power doppler signal normal.  IMPRESSION:  Mild posterior tibialis tendinitis otherwise unremarkable.   Procedure note 97110; 15 minutes spent for Therapeutic exercises as stated in above notes.  This included exercises focusing on stretching, strengthening, with significant focus on eccentric aspects.   Low back exercises that included:    Pelvic tilt/bracing instruction to focus on control of the pelvic girdle and lower abdominal muscles  Glute strengthening exercises, focusing on proper firing of the glutes without engaging the low back muscles Proper stretching techniques for maximum relief for the hamstrings, hip flexors, low back and some rotation where tolerated Exercises for the foot include:  Stretches to help lengthen the lower leg and plantar fascia areas Theraband exercises for the lower leg and ankle to help strengthen the surrounding area- dorsiflexion, plantarflexion, inversion, eversion Massage rolling on the plantar surface of the foot with a frozen bottle, tennis ball or golf ball Towel or marble pick-ups to strengthen the plantar surface of the foot Weight bearing exercises to increase balance and overall stability  Proper technique shown and discussed handout in great detail with ATC.  All questions were discussed and answered.      Impression and Recommendations:     This case required medical decision making of moderate complexity.

## 2014-08-22 NOTE — Progress Notes (Signed)
Pre visit review using our clinic review tool, if applicable. No additional management support is needed unless otherwise documented below in the visit note. 

## 2014-09-19 ENCOUNTER — Ambulatory Visit: Payer: 59 | Admitting: Family Medicine

## 2014-11-21 ENCOUNTER — Encounter: Payer: Self-pay | Admitting: Family Medicine

## 2014-11-21 ENCOUNTER — Telehealth: Payer: Self-pay | Admitting: Internal Medicine

## 2014-11-21 ENCOUNTER — Ambulatory Visit (INDEPENDENT_AMBULATORY_CARE_PROVIDER_SITE_OTHER): Payer: 59 | Admitting: Family Medicine

## 2014-11-21 VITALS — BP 132/86 | HR 83 | Wt 221.0 lb

## 2014-11-21 DIAGNOSIS — M9902 Segmental and somatic dysfunction of thoracic region: Secondary | ICD-10-CM | POA: Diagnosis not present

## 2014-11-21 DIAGNOSIS — M9903 Segmental and somatic dysfunction of lumbar region: Secondary | ICD-10-CM

## 2014-11-21 DIAGNOSIS — M549 Dorsalgia, unspecified: Secondary | ICD-10-CM | POA: Diagnosis not present

## 2014-11-21 DIAGNOSIS — M9904 Segmental and somatic dysfunction of sacral region: Secondary | ICD-10-CM | POA: Diagnosis not present

## 2014-11-21 DIAGNOSIS — M6789 Other specified disorders of synovium and tendon, multiple sites: Secondary | ICD-10-CM | POA: Diagnosis not present

## 2014-11-21 DIAGNOSIS — M999 Biomechanical lesion, unspecified: Secondary | ICD-10-CM | POA: Insufficient documentation

## 2014-11-21 DIAGNOSIS — M76829 Posterior tibial tendinitis, unspecified leg: Secondary | ICD-10-CM

## 2014-11-21 DIAGNOSIS — G8929 Other chronic pain: Secondary | ICD-10-CM

## 2014-11-21 MED ORDER — ZOLPIDEM TARTRATE 10 MG PO TABS: 10.0000 mg | ORAL_TABLET | Freq: Every evening | ORAL | Status: DC | PRN

## 2014-11-21 NOTE — Telephone Encounter (Signed)
rx printed

## 2014-11-21 NOTE — Assessment & Plan Note (Signed)
Mild improvement. We discussed icing regimen. We discussed wearing good shoes more often. We discussed the possible need for custom orthotics at a later date. Patient with continue to monitor and continue the conservative therapy.

## 2014-11-21 NOTE — Assessment & Plan Note (Signed)
Given more core strengthening exercises Ice regimen OTC natural vitamins Discussed ergonomics at work. Patient did respond well to osteopathic manipulation. Patient and will come back and see me again in 3-4 weeks for further evaluation and treatment. Discussed chiropractic care in the interim.

## 2014-11-21 NOTE — Telephone Encounter (Signed)
Pt had an appt with Dr. Katrinka BlazingSmith today and at check in she requested a refill on her Zolpidem Tartrate (Tab) Ambien 10 mg.  [facility] [address]

## 2014-11-21 NOTE — Patient Instructions (Signed)
Good to see you Stay active Ice is your friend Try the new exercise Exercises on wall.  Heel and butt touching.  Raise leg 6 inches and hold 2 seconds.  Down slow for count of 4 seconds.  1 set of 30 reps daily on both sides.  Wear the orthotics as much as you can.  Vitamin D 2000 IU daily Turmeric 500mg  twice daily See me again in 3 weeks.  [facility].

## 2014-11-21 NOTE — Progress Notes (Signed)
Pre visit review using our clinic review tool, if applicable. No additional management support is needed unless otherwise documented below in the visit note. 

## 2014-11-21 NOTE — Progress Notes (Signed)
[name] [address] [place], [place] [address] Phone: [phone] Subjective:    I'm seeing this patient by the request  of:  Rene Paci, MD   CC: Ankle pain. Chronic back pain follow-up  [name]:Deanna Richards Deanna Richards is a 29 y.o. female coming in with complaint of ankle pain.  Patient states that she has had pain in the bilateral ankles as well as chronic back pain.  Patient describes her ankle pain is 90% better. Still some mild discomfort from time to time. Patient is not doing the exercises regularly but as long she wears the over-the-counter orthotics. Discussed previously she is doing much better. Patient denies any numbness or tingling. Denies any significant radiation down the leg or up the leg. States that she has not have any cramping or new symptoms. Patient is also had chronic back pain and has had this pain for quite some time. Patient describes the pain is more of a dull aching sensation. Patient has not been doing any significant exercises on a regular basis. Patient states that it is more of a cramping sensation. Patient does have a x-rays back in March 2016 showing some mild facet hypertrophy bilaterally at L4-L5 and L5-S1 with mild arthritis patient states that this is not stopping from activity but makes it difficult to be motivated to do more activity.     Past medical history, social, surgical and family history all reviewed in electronic medical record.   Review of Systems: No headache, visual changes, nausea, vomiting, diarrhea, constipation, dizziness, abdominal pain, skin rash, fevers, chills, night sweats, weight loss, swollen lymph nodes, body aches, joint swelling, muscle aches, chest pain, shortness of breath, mood changes.   Objective Blood pressure 132/86, pulse 83, weight 221 lb (100.245 kg), SpO2 99 %.  General: No apparent distress alert and oriented x3 mood and affect normal, dressed appropriately.  HEENT: Pupils  equal, extraocular movements intact  Respiratory: Patient's speak in full sentences and does not appear short of breath  Cardiovascular: No lower extremity edema, non tender, no erythema  Skin: Warm dry intact with no signs of infection or rash on extremities or on axial skeleton.  Abdomen: Soft nontender  Neuro: Cranial nerves II through XII are intact, neurovascularly intact in all extremities with 2+ DTRs and 2+ pulses.  Lymph: No lymphadenopathy of posterior or anterior cervical chain or axillae bilaterally.  Gait normal with good balance and coordination.  MSK:  Non tender with full range of motion and good stability and symmetric strength and tone of shoulders, elbows, wrist, hip, knee and bilaterally.  Back Exam:  Inspection: Unremarkable  Motion: Flexion 45 deg, Extension 35 deg, Side Bending to 35 deg bilaterally,  Rotation to 45 deg bilaterally  SLR laying: Negative  XSLR laying: Negative  Palpable tenderness: Tender over the paraspinal musculature of the lumbar spine as well as the sacroiliac joints bilaterally. FABER: Positive bilaterally no significant change from previous exam Sensory change: Gross sensation intact to all lumbar and sacral dermatomes.  Reflexes: 2+ at both patellar tendons, 2+ at achilles tendons, Babinski's downgoing.  Strength at foot  Plantar-flexion: 5/5 Dorsi-flexion: 5/5 Eversion: 5/5 Inversion: 5/5  Leg strength  Quad: 5/5 Hamstring: 5/5 Hip flexor: 5/5 mild improvement Hip abductors: 4/5  Gait unremarkable.  Ankle: Bilateral No visible erythema or swelling. Patient's foot exam shows the patient does have  overpronation the hindfoot as well as some pes planus. Callus formation noted over the second metatarsal head bilaterally left greater  than right. Maybe a little improvement of the callus Range of motion is full in all directions. Strength is 5/5 in all directions. Stable lateral and medial ligaments; squeeze test and kleiger test  unremarkable; Talar dome nontender; No pain at base of 5th MT; No tenderness over cuboid; No tenderness over N spot or navicular prominence No tenderness on posterior aspects of lateral and medial malleolus No sign of peroneal tendon subluxations or tenderness to palpation Negative tarsal tunnel tinel's Able to walk 4 steps. No change  Osteopathic findings Thoracic T3 extended rotated and side bent right Lumbar L4 flexed rotated and side bent left Sacrum Left on left       Impression and Recommendations:     This case required medical decision making of moderate complexity.

## 2015-01-13 ENCOUNTER — Other Ambulatory Visit (INDEPENDENT_AMBULATORY_CARE_PROVIDER_SITE_OTHER): Payer: 59

## 2015-01-13 ENCOUNTER — Telehealth: Payer: Self-pay | Admitting: Family Medicine

## 2015-01-13 ENCOUNTER — Encounter: Payer: Self-pay | Admitting: Family Medicine

## 2015-01-13 ENCOUNTER — Ambulatory Visit (INDEPENDENT_AMBULATORY_CARE_PROVIDER_SITE_OTHER): Payer: 59 | Admitting: Family Medicine

## 2015-01-13 VITALS — BP 130/84 | HR 83 | Ht 62.0 in | Wt 210.0 lb

## 2015-01-13 DIAGNOSIS — M549 Dorsalgia, unspecified: Secondary | ICD-10-CM

## 2015-01-13 DIAGNOSIS — M9903 Segmental and somatic dysfunction of lumbar region: Secondary | ICD-10-CM

## 2015-01-13 DIAGNOSIS — M6788 Other specified disorders of synovium and tendon, other site: Secondary | ICD-10-CM | POA: Insufficient documentation

## 2015-01-13 DIAGNOSIS — M25572 Pain in left ankle and joints of left foot: Secondary | ICD-10-CM

## 2015-01-13 DIAGNOSIS — M999 Biomechanical lesion, unspecified: Secondary | ICD-10-CM

## 2015-01-13 DIAGNOSIS — M7662 Achilles tendinitis, left leg: Secondary | ICD-10-CM | POA: Diagnosis not present

## 2015-01-13 DIAGNOSIS — M9902 Segmental and somatic dysfunction of thoracic region: Secondary | ICD-10-CM | POA: Diagnosis not present

## 2015-01-13 DIAGNOSIS — M9904 Segmental and somatic dysfunction of sacral region: Secondary | ICD-10-CM

## 2015-01-13 DIAGNOSIS — G8929 Other chronic pain: Secondary | ICD-10-CM

## 2015-01-13 MED ORDER — MELOXICAM 15 MG PO TABS: 15.0000 mg | ORAL_TABLET | Freq: Every day | ORAL | Status: DC

## 2015-01-13 NOTE — Assessment & Plan Note (Signed)
Decision today to treat with OMT was based on Physical Exam  After verbal consent patient was treated with HVLA, ME techniques in cervical, thoracic, lumbar and sacral areas  Patient tolerated the procedure well with improvement in symptoms  Patient given exercises, stretches and lifestyle modifications  See medications in patient instructions if given  Patient will follow up in 3-4 weeks  

## 2015-01-13 NOTE — Assessment & Plan Note (Signed)
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 We discussed a heel lift, proper shoes, icing protocol and topical anti-inflammatory's. Patient given oral anti-inflammatory's as well because she felt a topical did not do as much previously. Patient work with Event organiser today. Patient will do these exercises and come back and see me again in 3-4 weeks. Continuing trouble we may consider a Cam Walker.

## 2015-01-13 NOTE — Assessment & Plan Note (Signed)
Likely mild exacerbation secondary to her ankle injury. I think she'll do better when she is walking more appropriately. We discussed icing regimen and continuing the over-the-counter natural supplementations. We'll not make changes and focus more on the Achilles tendinosis.

## 2015-01-13 NOTE — Progress Notes (Signed)
[facility] [address] [place], [place] [address] Phone: [phone] Subjective:     CC: Ankle pain follow-up. Chronic back pain follow-up  Deanna Richards:[id number] Deanna Richards is a 29 y.o. female coming in with complaint of ankle pain.   Patient is doing significantly better with the ankle pain at last visit. Patient states though that now she is having some swelling and increasing pain. Patient states unfortunately her left ankle seems to be hurting more. Patient has been working on a more regular basis. Patient states that unfortunately the posterior aspect of her ankle hurts especially after sitting for long time. Seems to loosen up with activity but then worsened again. Patient has been lifting weights and doing a lot more of high intensity exercises.  denies any numbness but does state that there is some swelling.  Patient is also had chronic back pain and has had this pain for quite some time.  Patient states that it is not hurting her as much.  States that she stays active it seems to be better. Patient is concerned because of her ankle she has not been as active as she would like. States that she think she is compensating for her ankle as well.   Patient does have a x-rays back in March 2016 showing some mild facet hypertrophy bilaterally at L4-L5 and L5-S1 with mild arthritis patient states that this is not stopping from activity but makes it difficult to be motivated to do more activity.     Past medical history, social, surgical and family history all reviewed in electronic medical record.   Review of Systems: No headache, visual changes, nausea, vomiting, diarrhea, constipation, dizziness, abdominal pain, skin rash, fevers, chills, night sweats, weight loss, swollen lymph nodes, body aches, joint swelling, muscle aches, chest pain, shortness of breath, mood changes.   Objective Blood pressure 130/84, pulse 83, height 5\' 2"  (1.575 m), weight 210 lb  (95.255 kg), SpO2 98 %.  General: No apparent distress alert and oriented x3 mood and affect normal, dressed appropriately.  HEENT: Pupils equal, extraocular movements intact  Respiratory: Patient's speak in full sentences and does not appear short of breath  Cardiovascular: No lower extremity edema, non tender, no erythema  Skin: Warm dry intact with no signs of infection or rash on extremities or on axial skeleton.  Abdomen: Soft nontender  Neuro: Cranial nerves II through XII are intact, neurovascularly intact in all extremities with 2+ DTRs and 2+ pulses.  Lymph: No lymphadenopathy of posterior or anterior cervical chain or axillae bilaterally.  Gait normal with good balance and coordination.  MSK:  Non tender with full range of motion and good stability and symmetric strength and tone of shoulders, elbows, wrist, hip, knee and bilaterally.  Back Exam:  Inspection: Unremarkable  Motion: Flexion 45 deg, Extension 35 deg, Side Bending to 35 deg bilaterally,  Rotation to 45 deg bilaterally  SLR laying: Negative  XSLR laying: Negative  Palpable tenderness: Tender over the paraspinal musculature of the lumbar spine as well as the sacroiliac joints bilaterally. FABER: Positive bilaterally no significant change from previous exam Sensory change: Gross sensation intact to all lumbar and sacral dermatomes.  Reflexes: 2+ at both patellar tendons, 2+ at achilles tendons, Babinski's downgoing.  Strength at foot  Plantar-flexion: 5/5 Dorsi-flexion: 5/5 Eversion: 5/5 Inversion: 5/5  Leg strength  Quad: 5/5 Hamstring: 5/5 Hip flexor: 5/5 mild improvement Hip abductors: 4/5  Gait unremarkable.  Ankle: Left No visible erythema or swelling. Patient's  foot exam shows the patient does have  overpronation the hindfoot as well as some pes planus.  Range of motion is full in all directions. Tightness of the posterior cord Strength is 5/5 in all directions. Stable lateral and medial ligaments; squeeze  test and kleiger test unremarkable; Talar dome nontender; No pain at base of 5th MT; No tenderness over cuboid; No tenderness over N spot or navicular prominence No tenderness over the Achilles tendon at its insertion No sign of peroneal tendon subluxations or tenderness to palpation  Negative tarsal tunnel tinel's Able to walk 4 steps. Contralateral ankle unremarkable but foot has same breakdown with overpronation.  Osteopathic findings Thoracic T3 extended rotated and side bent right Lumbar L4 flexed rotated and side bent left Sacrum Left on left Same pattern as previous  MSK US performed of: Left ankle This study was ordered, performed, and interpreted by Terrilee Files D.O.  Foot/Ankle:   All structures visualized.   Talar dome unremarkable  Ankle mortise without effusion. Peroneus longus and brevis tendons unremarkable on long and transverse views without sheath effusions. Posterior tibialis insufficiency noted but no hypoechoic changes, flexor hallucis longus, and flexor digitorum longus tendons unremarkable on long and transverse views without sheath effusions. Severe hypoechoic changes at the insertion with no true tear patient. Increasing Doppler flow as well as calcific changes with spurring. Anterior Talofibular Ligament and Calcaneofibular Ligaments unremarkable and intact. Deltoid Ligament unremarkable and intact. Plantar fascia intact and without effusion, normal thickness. No increased doppler signal, cap sign, or thickening of tibial cortex.   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  Procedure note 97110; 15 minutes spent for Therapeutic exercises as stated in above notes.  This included exercises focusing on stretching, strengthening, with significant focus on eccentric aspects.  Ankle strengthening that included:  Basic range of motion exercises to allow proper full motion at ankle Stretching of the lower leg and hamstrings    Theraband exercises for the lower leg - inversion, eversion, dorsiflexion and plantarflexion each to be completed with a theraband Balance exercises to increase proprioception Weight bearing exercises to increase strength and balance Proper technique shown and discussed handout in great detail with ATC.  All questions were discussed and answered.      Impression and Recommendations:     This case required medical decision making of moderate complexity.

## 2015-01-13 NOTE — Telephone Encounter (Signed)
Pt was seen today and she called back wanting to be fit for a boot Please call her at [phone]

## 2015-01-13 NOTE — Progress Notes (Signed)
Pre visit review using our clinic review tool, if applicable. No additional management support is needed unless otherwise documented below in the visit note. 

## 2015-01-13 NOTE — Patient Instructions (Addendum)
Good ot see you Meloxicam daily for 10 days then as needed ICe at end of workout and before bed.  Heel lift in left shoe at all time No being barefoot No combat until I see you No Zumba for 2 weeks Exercises 3 times a week.  See me in 3-4 weeks.

## 2015-01-16 NOTE — Telephone Encounter (Signed)
Spoke with patient about using a boot for walking during the day due to pain. She plans to come by later today to be fitted for a boot.

## 2015-02-03 ENCOUNTER — Encounter: Payer: Self-pay | Admitting: Family Medicine

## 2015-02-03 ENCOUNTER — Other Ambulatory Visit (INDEPENDENT_AMBULATORY_CARE_PROVIDER_SITE_OTHER): Payer: 59

## 2015-02-03 ENCOUNTER — Ambulatory Visit (INDEPENDENT_AMBULATORY_CARE_PROVIDER_SITE_OTHER): Payer: 59 | Admitting: Family Medicine

## 2015-02-03 VITALS — BP 124/80 | HR 77 | Wt 212.0 lb

## 2015-02-03 DIAGNOSIS — M25572 Pain in left ankle and joints of left foot: Secondary | ICD-10-CM | POA: Diagnosis not present

## 2015-02-03 DIAGNOSIS — M9902 Segmental and somatic dysfunction of thoracic region: Secondary | ICD-10-CM | POA: Diagnosis not present

## 2015-02-03 DIAGNOSIS — M999 Biomechanical lesion, unspecified: Secondary | ICD-10-CM

## 2015-02-03 DIAGNOSIS — M549 Dorsalgia, unspecified: Secondary | ICD-10-CM | POA: Diagnosis not present

## 2015-02-03 DIAGNOSIS — M9904 Segmental and somatic dysfunction of sacral region: Secondary | ICD-10-CM

## 2015-02-03 DIAGNOSIS — M7662 Achilles tendinitis, left leg: Secondary | ICD-10-CM | POA: Diagnosis not present

## 2015-02-03 DIAGNOSIS — G8929 Other chronic pain: Secondary | ICD-10-CM

## 2015-02-03 DIAGNOSIS — M9903 Segmental and somatic dysfunction of lumbar region: Secondary | ICD-10-CM

## 2015-02-03 NOTE — Assessment & Plan Note (Signed)
Improving overall at this time. Almost has complete resolution of the calcific changes of his noted previously. Increased patient's activity and strengthening exercises. Exercise prescription given and we discussed which activities to still avoid. Patient will come back again in 4-6 weeks for further evaluation and treatment. Discuss again the heel lift secondary to a leg length discrepancy.

## 2015-02-03 NOTE — Assessment & Plan Note (Signed)
Patient will be out of the boot at this time. We discussed icing regimen and home exercise. We discussed which activities to doing which was potentially avoid. Patient will start increasing her activity and work on her core again. With patient being out of the Cam Dan Humphreys think this will improve. Continues to respond well to osteopathic manipulation.

## 2015-02-03 NOTE — Patient Instructions (Signed)
Get your zumba on.... 2 times a week.  Ice still is your friend Combat in 2 weeks Wear heel lift! Go to shoes You should be pain free in 4 weeks and that when you see me.

## 2015-02-03 NOTE — Assessment & Plan Note (Signed)
Decision today to treat with OMT was based on Physical Exam  After verbal consent patient was treated with HVLA, ME techniques in cervical, thoracic, lumbar and sacral areas  Patient tolerated the procedure well with improvement in symptoms  Patient given exercises, stretches and lifestyle modifications  See medications in patient instructions if given  Patient will follow up in 4 weeks   

## 2015-02-03 NOTE — Progress Notes (Signed)
[facility] [address] [place], [place] [address] Phone: [phone] Subjective:     CC: Ankle pain follow-up. Chronic back pain follow-up  [name] is a 29 y.o. female coming in with complaint of ankle pain.   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 Patient had significant calcific changes. Was having worsening pain and patient was put in a [employer]. Patient has been wearing this diligently and doing home exercises and icing. States that she is a proximal a 60-70% better. Denies any new symptoms. Happy with the results. Patient once to become more active and continued to attempt to lose weight.  Patient is also had chronic back pain and has had this pain for quite some time.  Patient was doing much better but with her not being as active she has noticed some more tightness. Denies any radiation down the legs or any numbness or tingling. States that it is just more tight and she notes her core strength seems to be weaker than it has been.   Pertinent past medical history  Patient does have a x-rays back in March 2016 showing some mild facet hypertrophy bilaterally at L4-L5 and L5-S1 with mild arthritis patient states that this is not stopping from activity but makes it difficult to be motivated to do more activity.     Past medical history, social, surgical and family history all reviewed in electronic medical record.   Review of Systems: No headache, visual changes, nausea, vomiting, diarrhea, constipation, dizziness, abdominal pain, skin rash, fevers, chills, night sweats, weight loss, swollen lymph nodes, body aches, joint swelling, muscle aches, chest pain, shortness of breath, mood changes.   Objective Blood pressure 124/80, pulse 77, weight 212 lb (96.163 kg), SpO2 99 %.  General: No apparent distress alert and oriented x3 mood and affect normal, dressed appropriately.  HEENT: Pupils equal, extraocular  movements intact  Respiratory: Patient's speak in full sentences and does not appear short of breath  Cardiovascular: No lower extremity edema, non tender, no erythema  Skin: Warm dry intact with no signs of infection or rash on extremities or on axial skeleton.  Abdomen: Soft nontender  Neuro: Cranial nerves II through XII are intact, neurovascularly intact in all extremities with 2+ DTRs and 2+ pulses.  Lymph: No lymphadenopathy of posterior or anterior cervical chain or axillae bilaterally.  Gait normal with good balance and coordination.  MSK:  Non tender with full range of motion and good stability and symmetric strength and tone of shoulders, elbows, wrist, hip, knee and bilaterally.  Back Exam:  Inspection: Unremarkable  Motion: Flexion 45 deg, Extension 35 deg, Side Bending to 35 deg bilaterally,  Rotation to 45 deg bilaterally  SLR laying: Negative  XSLR laying: Negative  Palpable tenderness: Less tender than previous exam FABER: Positive bilaterally no significant change from previous exam Sensory change: Gross sensation intact to all lumbar and sacral dermatomes.  Reflexes: 2+ at both patellar tendons, 2+ at achilles tendons, Babinski's downgoing.  Strength at foot  Plantar-flexion: 5/5 Dorsi-flexion: 5/5 Eversion: 5/5 Inversion: 5/5  Leg strength  Quad: 5/5 Hamstring: 5/5 Hip flexor: 5/5 mild improvement Hip abductors: 4/5  Gait unremarkable. 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 Patient's foot exam shows the patient does have  overpronation the hindfoot as well as some pes planus.  Range of motion is full in all directions. Tightness of the posterior  cord Strength is 5/5 in all directions. Stable lateral and medial ligaments; squeeze test and kleiger test unremarkable; Talar dome nontender; No pain at base of 5th MT; No tenderness over cuboid; No tenderness over N spot or navicular  prominence Nontender over Achilles No sign of peroneal tendon subluxations or tenderness to palpation  Negative tarsal tunnel tinel's Able to walk 4 steps. Contralateral ankle unremarkable but foot has same breakdown with overpronation.  Osteopathic findings Thoracic T5 extended rotated and side bent right Lumbar L3 flexed rotated and side bent left Sacrum Left on left   MSK US performed of: Left ankle This study was ordered, performed, and interpreted by Terrilee Files D.O.  Foot/Ankle:   All structures visualized.   Talar dome unremarkable  Ankle mortise without effusion. Peroneus longus and brevis tendons unremarkable on long and transverse views without sheath effusions. Posterior tibialis insufficiency noted but no hypoechoic changes, flexor hallucis longus, and flexor digitorum longus tendons unremarkable on long and transverse views without sheath effusions. Achilles tendon no longer has hypoechoic changes. Still increase in diameter at insertion to all the contralateral side Anterior Talofibular Ligament and Calcaneofibular Ligaments unremarkable and intact. Deltoid Ligament unremarkable and intact. Plantar fascia intact and without effusion, normal thickness. No increased doppler signal, cap sign, or thickening of tibial cortex.   IMPRESSION:  Resolution of calcific changes but continued Achilles tendinosis but improvement   Impression and Recommendations:     This case required medical decision making of moderate complexity.

## 2015-02-15 ENCOUNTER — Other Ambulatory Visit: Payer: Self-pay | Admitting: Internal Medicine

## 2015-02-15 ENCOUNTER — Telehealth: Payer: Self-pay | Admitting: Internal Medicine

## 2015-02-15 MED ORDER — ZOLPIDEM TARTRATE 10 MG PO TABS
10.0000 mg | ORAL_TABLET | Freq: Every evening | ORAL | Status: DC | PRN
Start: 2015-02-15 — End: 2015-02-15

## 2015-02-15 NOTE — Telephone Encounter (Signed)
Patient requesting refill for zolpidem (AMBIEN) 10 MG tablet [[id number] Pharmacy is [facility] on [address]

## 2015-02-15 NOTE — Telephone Encounter (Signed)
Patient Name: Deanna Richards DOB: 01/05/1986 Initial Comment Caller states she was dizzy and cold with headache yesterday, feels better today, but has questions. Nurse Assessment Nurse: Yetta Barre, RN, Miranda Date/Time (Eastern Time): 02/15/2015 9:33:04 AM Confirm and document reason for call. If symptomatic, describe symptoms. ---Caller states she felt cold yesterday, headache, and dizziness. The headache and dizziness have resolved but still feels cold. No other symptoms. Has the patient traveled out of the country within the last 30 days? ---No Does the patient require triage? ---Yes Related visit to physician within the last 2 weeks? ---No Does the PT have any chronic conditions? (i.e. diabetes, asthma, etc.) ---Yes List chronic conditions. ---Insomnia, Did the patient indicate they were pregnant? ---No Guidelines Guideline Title Affirmed Question Affirmed Notes Headache Headache (all triage questions negative) Final Disposition User Home Care Yetta Barre, RN, Miranda Disagree/Comply: Danella Maiers

## 2015-02-15 NOTE — Telephone Encounter (Signed)
Called in rx per pt request.  Recent OV with rx of same done at that time.

## 2015-03-02 ENCOUNTER — Telehealth: Payer: Self-pay | Admitting: Family Medicine

## 2015-03-02 NOTE — Telephone Encounter (Signed)
Please give patient a call  

## 2015-03-02 NOTE — Telephone Encounter (Signed)
Spoke with patient about heel lift options

## 2015-03-03 ENCOUNTER — Ambulatory Visit: Payer: 59 | Admitting: Family Medicine

## 2015-03-06 ENCOUNTER — Ambulatory Visit: Payer: 59 | Admitting: Family Medicine

## 2015-03-06 DIAGNOSIS — Z0289 Encounter for other administrative examinations: Secondary | ICD-10-CM

## 2015-03-17 ENCOUNTER — Ambulatory Visit (INDEPENDENT_AMBULATORY_CARE_PROVIDER_SITE_OTHER): Payer: 59 | Admitting: Family Medicine

## 2015-03-17 ENCOUNTER — Encounter: Payer: Self-pay | Admitting: Family Medicine

## 2015-03-17 VITALS — BP 134/84 | HR 72 | Ht 62.0 in | Wt 213.0 lb

## 2015-03-17 DIAGNOSIS — M9903 Segmental and somatic dysfunction of lumbar region: Secondary | ICD-10-CM

## 2015-03-17 DIAGNOSIS — M549 Dorsalgia, unspecified: Secondary | ICD-10-CM | POA: Diagnosis not present

## 2015-03-17 DIAGNOSIS — M999 Biomechanical lesion, unspecified: Secondary | ICD-10-CM

## 2015-03-17 DIAGNOSIS — M9904 Segmental and somatic dysfunction of sacral region: Secondary | ICD-10-CM | POA: Diagnosis not present

## 2015-03-17 DIAGNOSIS — M9902 Segmental and somatic dysfunction of thoracic region: Secondary | ICD-10-CM

## 2015-03-17 DIAGNOSIS — G8929 Other chronic pain: Secondary | ICD-10-CM

## 2015-03-17 DIAGNOSIS — M7662 Achilles tendinitis, left leg: Secondary | ICD-10-CM | POA: Diagnosis not present

## 2015-03-17 NOTE — Progress Notes (Signed)
[name] [address] [place], [place] [address] Phone: [phone] Subjective:     CC: Ankle pain follow-up. Chronic back pain follow-up  [name]:[id number] Deanna Richards is a 29 y.o. female coming in with complaint of ankle pain.   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 Patient was to transition into shoes. 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 Patient was to start doing some of her exercise classes again. Patient states she is having minimal pain at all. Patient has been able to start increasing her activities. Doing most of her exercises without any pain. Daily activities are doing well. Was not able to get the heel lift.  Patient is also had chronic back pain and has had this pain for quite some time.  Patient was doing much better but with her not being as active she has noticed some more tightness. Denies any radiation down the legs or any numbness or tingling. States that it is just more tight and she notes her core strength seems to be weaker than it has been. Patient states that with her increasing activity she has noticed some mild discomfort. Nothing that stopping her from activities.   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    Past medical history, social, surgical and family history all reviewed in electronic medical record.   Review of Systems: No headache, visual changes, nausea, vomiting, diarrhea, constipation, dizziness, abdominal pain, skin rash, fevers, chills, night sweats, weight loss, swollen lymph nodes, body aches, joint swelling, muscle aches, chest pain, shortness of breath, mood changes.   Objective Blood pressure 134/84, pulse 72, height  (1.575 m), weight 213 lb (96.616 kg), SpO2 99 %.  General: No apparent distress alert and oriented x3 mood and  affect normal, dressed appropriately.  HEENT: Pupils equal, extraocular movements intact  Respiratory: Patient's speak in full sentences and does not appear short of breath  Cardiovascular: No lower extremity edema, non tender, no erythema  Skin: Warm dry intact with no signs of infection or rash on extremities or on axial skeleton.  Abdomen: Soft nontender  Neuro: Cranial nerves II through XII are intact, neurovascularly intact in all extremities with 2+ DTRs and 2+ pulses.  Lymph: No lymphadenopathy of posterior or anterior cervical chain or axillae bilaterally.  Gait normal with good balance and coordination.  MSK:  Non tender with full range of motion and good stability and symmetric strength and tone of shoulders, elbows, wrist, hip, knee and bilaterally.  Back Exam:  Inspection: Unremarkable  Motion: Flexion 45 deg, Extension 35 deg, Side Bending to 35 deg bilaterally,  Rotation to 45 deg bilaterally  SLR laying: Negative  XSLR laying: Negative  Palpable tenderness: Continued tenderness of the paraspinal musculature FABER: Positive bilaterally no significant change from previous exam Sensory change: Gross sensation intact to all lumbar and sacral dermatomes.  Reflexes: 2+ at both patellar tendons, 2+ at achilles tendons, Babinski's downgoing.  Strength at foot  Plantar-flexion: 5/5 Dorsi-flexion: 5/5 Eversion: 5/5 Inversion: 5/5  Leg strength  Quad: 5/5 Hamstring: 5/5 Hip flexor: 5/5 mild improvement Hip abductors: 4/5  Gait unremarkable. 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 Tightness of the posterior cord Strength is 5/5 in all directions. Stable lateral and medial ligaments; squeeze test and kleiger test unremarkable; Talar dome nontender; No pain at base of 5th MT; No  tenderness over cuboid; No tenderness over N spot or navicular prominence Nontender over Achilles No sign of peroneal tendon subluxations or tenderness to palpation  Negative tarsal tunnel tinel's Able to walk 4 steps. Contralateral ankle unremarkable but foot has same breakdown with overpronation.  Osteopathic findings Thoracic T3 extended rotated and side bent left T5 extended rotated and side bent right Lumbar L2 flexed rotated and side bent left Sacrum Left on left      Impression and Recommendations:     This case required medical decision making of moderate complexity.

## 2015-03-17 NOTE — Patient Instructions (Signed)
Great to see you Deanna Richards is your friend Gel pads (dr schoals would be great.) [employer] orthotics "total support" Can look at [email or web address] as well.  See me again in 6 weeks.

## 2015-03-17 NOTE — Progress Notes (Signed)
Pre visit review using our clinic review tool, if applicable. No additional management support is needed unless otherwise documented below in the visit note. 

## 2015-03-18 ENCOUNTER — Encounter: Payer: Self-pay | Admitting: Family Medicine

## 2015-03-18 NOTE — Assessment & Plan Note (Signed)
Continues to be secondary to poor posture. Patient is to work on her core strength. We discussed icing regimen and home exercises. We discussed which activities to do an which was potentially avoid. Patient come back and see me again in 4-6 weeks for further evaluation and treatment.

## 2015-03-18 NOTE — Assessment & Plan Note (Signed)
Patient is doing much better at this time. Encourage her to continue home exercises 2-3 times a week. Patient will continue to monitor for any radicular symptoms. Patient has any worsening calcific changes again we may need to consider further workup including labs to rule out autoimmune workup. Patient though will see me fairly regularly for her back pain.

## 2015-03-18 NOTE — Assessment & Plan Note (Signed)
Decision today to treat with OMT was based on Physical Exam  After verbal consent patient was treated with HVLA, ME techniques in cervical, thoracic, lumbar and sacral areas  Patient tolerated the procedure well with improvement in symptoms  Patient given exercises, stretches and lifestyle modifications  See medications in patient instructions if given  Patient will follow up in 4 weeks   

## 2015-05-01 ENCOUNTER — Ambulatory Visit: Payer: 59 | Admitting: Family Medicine

## 2015-05-15 ENCOUNTER — Ambulatory Visit (INDEPENDENT_AMBULATORY_CARE_PROVIDER_SITE_OTHER): Payer: 59 | Admitting: Family Medicine

## 2015-05-15 ENCOUNTER — Encounter: Payer: Self-pay | Admitting: Family Medicine

## 2015-05-15 VITALS — BP 132/84 | HR 86 | Ht 62.0 in | Wt 219.0 lb

## 2015-05-15 DIAGNOSIS — M9902 Segmental and somatic dysfunction of thoracic region: Secondary | ICD-10-CM | POA: Diagnosis not present

## 2015-05-15 DIAGNOSIS — M9904 Segmental and somatic dysfunction of sacral region: Secondary | ICD-10-CM | POA: Diagnosis not present

## 2015-05-15 DIAGNOSIS — M549 Dorsalgia, unspecified: Secondary | ICD-10-CM

## 2015-05-15 DIAGNOSIS — M9903 Segmental and somatic dysfunction of lumbar region: Secondary | ICD-10-CM

## 2015-05-15 DIAGNOSIS — M6789 Other specified disorders of synovium and tendon, multiple sites: Secondary | ICD-10-CM

## 2015-05-15 DIAGNOSIS — M76829 Posterior tibial tendinitis, unspecified leg: Secondary | ICD-10-CM

## 2015-05-15 DIAGNOSIS — G8929 Other chronic pain: Secondary | ICD-10-CM

## 2015-05-15 DIAGNOSIS — M999 Biomechanical lesion, unspecified: Secondary | ICD-10-CM

## 2015-05-15 NOTE — Progress Notes (Signed)
Pre visit review using our clinic review tool, if applicable. No additional management support is needed unless otherwise documented below in the visit note. 

## 2015-05-15 NOTE — Assessment & Plan Note (Signed)
Decision today to treat with OMT was based on Physical Exam  After verbal consent patient was treated with HVLA, ME techniques in cervical, thoracic, lumbar and sacral areas  Patient tolerated the procedure well with improvement in symptoms  Patient given exercises, stretches and lifestyle modifications  See medications in patient instructions if given  Patient will follow up in 4-5 weeks                                    

## 2015-05-15 NOTE — Patient Instructions (Signed)
Good to see you Ice at night to the feet Massage to the feet can help Double the vitamin D to 4000 IU daily For the back it is all about muscle imbalances.   Orthotics will help and we will get that scheduled.  You need to eat at least 20-30 grams of protein after working out.  Eat 100 calories at least every 2 hours.  Stay active overall.  And continue the back exercises See me again 4 weeks after the orthotics.

## 2015-05-15 NOTE — Assessment & Plan Note (Signed)
Discussed with patient again. Patient's pain is secondary to poor core strength and muscle imbalances. We discussed the importance of this. I'm hoping that patient's custom orthotics will be somewhat beneficial. Patient is somewhat frustrated. Wondering if she should be seen by a back specialist. Patient has responded very well to osteopathic manipulation and we did try again today. Patient has muscle relaxer for breakthrough pain as well as an oral anti-inflammatory. Patient and will come back and see me again in 4 weeks for further evaluation. I did not feel that advance imaging is warranted with no radicular symptoms or any pain that stops her from activities at this time. Discussed different diet changes and timing of food protein supplementation the can be beneficial.

## 2015-05-15 NOTE — Progress Notes (Signed)
Deanna Richards D.O. [facility] [address] [place], [place] [address] Phone: [phone] Subjective:     CC: Ankle pain follow-up. Chronic back pain follow-up  [name]:NWGNFAOZHYHPI:Subjective Deanna Richards is a 29 y.o. female coming in with complaint of ankle pain.   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 Patient was to transition into shoes. Patient is doing significantly better at this time. Having some more pain on the ball of her feet. Patient states though that the Achilles tendon itself has no pain whatsoever.  Patient is also had chronic back pain and has had this pain for quite some time.  Has responded well to osteopathic manipulation but continues to have pain. Describes it as a dull, throbbing aching sensation. Denies any radiation down her legs. Patient is becoming frustrated though because she is never without some discomfort. States though that the anti-inflammatory does help.   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    Past medical history, social, surgical and family history all reviewed in electronic medical record.   Review of Systems: No headache, visual changes, nausea, vomiting, diarrhea, constipation, dizziness, abdominal pain, skin rash, fevers, chills, night sweats, weight loss, swollen lymph nodes, body aches, joint swelling, muscle aches, chest pain, shortness of breath, mood changes.   Objective Blood pressure 132/84, pulse 86, height 5\' 2"  (1.575 m), weight 219 lb (99.338 kg), SpO2 98 %.  General: No apparent distress alert and oriented x3 mood and affect normal, dressed appropriately.  HEENT: Pupils equal, extraocular movements intact  Respiratory: Patient's speak in full sentences and does not appear short of breath  Cardiovascular: No lower extremity edema, non tender, no erythema  Skin: Warm dry intact with no  signs of infection or rash on extremities or on axial skeleton.  Abdomen: Soft nontender  Neuro: Cranial nerves II through XII are intact, neurovascularly intact in all extremities with 2+ DTRs and 2+ pulses.  Lymph: No lymphadenopathy of posterior or anterior cervical chain or axillae bilaterally.  Gait normal with good balance and coordination.  MSK:  Non tender with full range of motion and good stability and symmetric strength and tone of shoulders, elbows, wrist, hip, knee and bilaterally.  Back Exam:  Inspection: Unremarkable  Motion: Flexion 45 deg, Extension 35 deg, Side Bending to 35 deg bilaterally,  Rotation to 45 deg bilaterally  SLR laying: Negative  XSLR laying: Negative  Palpable tenderness: Continued tenderness of the paraspinal musculature no specific findings that seemed to be different. FABER: Positive bilaterally no significant change from previous exam Sensory change: Gross sensation intact to all lumbar and sacral dermatomes.  Reflexes: 2+ at both patellar tendons, 2+ at achilles tendons, Babinski's downgoing.  Strength at foot  Plantar-flexion: 5/5 Dorsi-flexion: 5/5 Eversion: 5/5 Inversion: 5/5  Leg strength  Quad: 5/5 Hamstring: 5/5 Hip flexor: 5/5 mild improvement Hip abductors: 4/5  Gait unremarkable. 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 Tightness of the posterior cord Strength is 5/5 in all directions. Stable lateral and medial ligaments; squeeze test and kleiger test unremarkable; Talar dome nontender; No pain at base of 5th MT; No tenderness over cuboid; No tenderness over N spot or navicular prominence Nontender over Achilles No sign of peroneal tendon subluxations  or tenderness to palpation  Negative tarsal tunnel tinel's Able to walk 4 steps. Contralateral ankle unremarkable but foot has  same breakdown with overpronation.  Osteopathic findings Thoracic T3 extended rotated and side bent left T5 extended rotated and side bent right Lumbar L2 flexed rotated and side bent left L4 flexed rotated and side bent right Sacrum Left on left      Impression and Recommendations:     This case required medical decision making of moderate complexity.

## 2015-05-15 NOTE — Assessment & Plan Note (Signed)
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 Because of this she is having more of a sesamoiditis today. I do feel that further orthotics could be beneficial. Patient will be set up for custom orthotics in the near future. We discussed continuing the icing protocol and patient given home exercises to work on the transverse arch itself. We discussed the importance of the metatarsal pad. Patient has improved for Achilles tendinosis but unfortunately I do think that the chronic foot pain would continue if we did not do something more aggressive.

## 2015-05-25 ENCOUNTER — Telehealth: Payer: Self-pay

## 2015-05-25 ENCOUNTER — Ambulatory Visit: Payer: 59 | Admitting: Family Medicine

## 2015-05-25 NOTE — Telephone Encounter (Signed)
Patient wants to think about taking flu vaccine, if she decides to, she will call back to schedule nurse visit for flu vaccine

## 2015-05-26 ENCOUNTER — Ambulatory Visit (INDEPENDENT_AMBULATORY_CARE_PROVIDER_SITE_OTHER): Payer: 59 | Admitting: Family Medicine

## 2015-05-26 ENCOUNTER — Encounter: Payer: Self-pay | Admitting: Family Medicine

## 2015-05-26 DIAGNOSIS — M6789 Other specified disorders of synovium and tendon, multiple sites: Secondary | ICD-10-CM

## 2015-05-26 DIAGNOSIS — M76829 Posterior tibial tendinitis, unspecified leg: Secondary | ICD-10-CM

## 2015-05-26 NOTE — Patient Instructions (Signed)
Acclimating to your [employer] orthotics -  ° °We recommend that you allow up to 2 weeks for you to fully acclimate to your new custom orthotics. Please use the following recommended plan to build into full day wear.  ° °Day 1 - 2hours/day °Every day afterwards add 1 hour of wear(3hrs/day, 4hrs/day, etc) until you are able to wear them for an entire day without issues.  ° °If you notice any irritation or increasing discomfort with your new orthotics, please do not hesitate in contacting the office(leave a message for Shamera Yarberry or Lindsay) or sending Hakiem Malizia a message through MyChart to arrange a time to review your fit.  ° °Enjoy your new orthotics!!  ° °Deanna Richards ° ° ° °

## 2015-05-26 NOTE — Progress Notes (Signed)
Patient was fitted for a : standard, cushioned, semi-rigid orthotic. The orthotic was heated and afterward the patient was in a seated position and the orthotic molded. The patient was positioned in subtalar neutral position and 10 degrees of ankle dorsiflexion in a non-weight bearing stance. After completion of molding, patient did have orthotic management which included instructions on acclimating to the orthotics, signs of ill fit as well as care for the orthotic.  I spent approximately with the patient, properly fitting the orthotics and discussing wear between shoes   The blank was ground to a stable position for weight bearing. Size: 9 ([employer]li [employer]fort)  Base: Carbon fiber Additional Posting and Padding: The following postings were fitted onto the molded orthotics to help maintain a talar neutral position - Wedge posting for transverse arch: 250/35      Silicone posting for longitudinal arch: 250/120 and 250/100 on left  250/100 x2 on right. Also did postings on the heel to stabilize the calcaneus.  The patient ambulated these, and they were very comfortable and supportive.

## 2015-05-26 NOTE — Assessment & Plan Note (Signed)
Believe with proper alignment patient will have decreasing pain in the back as well. We discussed with patient to wear them slowly over the course the next 2 weeks. Patient has anti-inflammatories for procedure pain. Patient will come back in 4 weeks for further evaluation and treatment.

## 2015-08-08 ENCOUNTER — Telehealth: Payer: Self-pay

## 2015-08-08 NOTE — Telephone Encounter (Signed)
LVM for pt to call back as soon as possible.   Re: Flu vaccine 2016-2017 

## 2015-08-14 ENCOUNTER — Ambulatory Visit: Payer: 59 | Admitting: Internal Medicine

## 2015-09-05 ENCOUNTER — Encounter: Payer: Self-pay | Admitting: Internal Medicine

## 2015-09-05 ENCOUNTER — Other Ambulatory Visit (INDEPENDENT_AMBULATORY_CARE_PROVIDER_SITE_OTHER): Payer: 59

## 2015-09-05 ENCOUNTER — Ambulatory Visit (INDEPENDENT_AMBULATORY_CARE_PROVIDER_SITE_OTHER): Payer: 59 | Admitting: Internal Medicine

## 2015-09-05 VITALS — BP 170/122 | HR 84 | Temp 98.9°F | Resp 16 | Ht 62.0 in | Wt 229.0 lb

## 2015-09-05 DIAGNOSIS — G4700 Insomnia, unspecified: Secondary | ICD-10-CM

## 2015-09-05 DIAGNOSIS — Z0000 Encounter for general adult medical examination without abnormal findings: Secondary | ICD-10-CM | POA: Diagnosis not present

## 2015-09-05 DIAGNOSIS — F17200 Nicotine dependence, unspecified, uncomplicated: Secondary | ICD-10-CM

## 2015-09-05 DIAGNOSIS — Z0001 Encounter for general adult medical examination with abnormal findings: Secondary | ICD-10-CM | POA: Insufficient documentation

## 2015-09-05 DIAGNOSIS — I10 Essential (primary) hypertension: Secondary | ICD-10-CM

## 2015-09-05 LAB — LIPID PANEL
Cholesterol: 121 mg/dL (ref 0–200)
HDL: 55.3 mg/dL (ref >39.00)
LDL CALC: 40 mg/dL (ref 0–99)
NONHDL: 66.17
Total CHOL/HDL Ratio: 2
Triglycerides: 132 mg/dL (ref 0.0–149.0)
VLDL: 26.4 mg/dL (ref 0.0–40.0)

## 2015-09-05 LAB — COMPREHENSIVE METABOLIC PANEL
ALT: 20 U/L (ref 0–35)
AST: 17 U/L (ref 0–37)
Albumin: 3.8 g/dL (ref 3.5–5.2)
Alkaline Phosphatase: 36 U/L — ABNORMAL LOW (ref 39–117)
BUN: 13 mg/dL (ref 6–23)
CHLORIDE: 102 meq/L (ref 96–112)
CO2: 24 meq/L (ref 19–32)
CREATININE: 0.7 mg/dL (ref 0.40–1.20)
Calcium: 9 mg/dL (ref 8.4–10.5)
GFR: 127.22 mL/min (ref >60.00)
GLUCOSE: 80 mg/dL (ref 70–99)
POTASSIUM: 3.7 meq/L (ref 3.5–5.1)
SODIUM: 135 meq/L (ref 135–145)
Total Bilirubin: 0.3 mg/dL (ref 0.2–1.2)
Total Protein: 7 g/dL (ref 6.0–8.3)

## 2015-09-05 LAB — CBC
HCT: 40.3 % (ref 36.0–46.0)
Hemoglobin: 14 g/dL (ref 12.0–15.0)
MCHC: 34.9 g/dL (ref 30.0–36.0)
MCV: 90.1 fl (ref 78.0–100.0)
PLATELETS: 226 10*3/uL (ref 150.0–400.0)
RBC: 4.47 Mil/uL (ref 3.87–5.11)
RDW: 12.3 % (ref 11.5–15.5)
WBC: 7.4 10*3/uL (ref 4.0–10.5)

## 2015-09-05 LAB — HEMOGLOBIN A1C: Hgb A1c MFr Bld: 5.6 % (ref 4.6–6.5)

## 2015-09-05 MED ORDER — ESZOPICLONE 3 MG PO TABS: 3.0000 mg | ORAL_TABLET | Freq: Every day | ORAL | Status: DC

## 2015-09-05 MED ORDER — FLUTICASONE PROPIONATE 50 MCG/ACT NA SUSP: 2.0000 | Freq: Every day | NASAL | Status: DC

## 2015-09-05 NOTE — Assessment & Plan Note (Signed)
Talked to her about resuming exercise and being more conscious of meals. She has been offered weight loss medications in the past but wants to do it on her own.

## 2015-09-05 NOTE — Patient Instructions (Signed)
We have sent in lunesta for sleep to replace the ambien.   We have sent in flonase to help with the sinuses. Use 2 sprays in each nostril daily during allergy season.     Health Maintenance, Female Adopting a healthy lifestyle and getting preventive care can go a long way to promote health and wellness. Talk with your health care provider about what schedule of regular examinations is right for you. This is a good chance for you to check in with your provider about disease prevention and staying healthy. In between checkups, there are plenty of things you can do on your own. Experts have done a lot of research about which lifestyle changes and preventive measures are most likely to keep you healthy. Ask your health care provider for more information. WEIGHT AND DIET  Eat a healthy diet  Be sure to include plenty of vegetables, fruits, low-fat dairy products, and lean protein.  Do not eat a lot of foods high in solid fats, added sugars, or salt.  Get regular exercise. This is one of the most important things you can do for your health.  Most adults should exercise for at least 150 minutes each week. The exercise should increase your heart rate and make you sweat (moderate-intensity exercise).  Most adults should also do strengthening exercises at least twice a week. This is in addition to the moderate-intensity exercise.  Maintain a healthy weight  Body mass index (BMI) is a measurement that can be used to identify possible weight problems. It estimates body fat based on height and weight. Your health care provider can help determine your BMI and help you achieve or maintain a healthy weight.  For females 62 years of age and older:   A BMI below 18.5 is considered underweight.  A BMI of 18.5 to 24.9 is normal.  A BMI of 25 to 29.9 is considered overweight.  A BMI of 30 and above is considered obese.  Watch levels of cholesterol and blood lipids  You should start having your  blood tested for lipids and cholesterol at 29 years of age, then have this test every 5 years.  You may need to have your cholesterol levels checked more often if:  Your lipid or cholesterol levels are high.  You are older than 29 years of age.  You are at high risk for heart disease.  CANCER SCREENING   Lung Cancer  Lung cancer screening is recommended for adults 52-67 years old who are at high risk for lung cancer because of a history of smoking.  A yearly low-dose CT scan of the lungs is recommended for people who:  Currently smoke.  Have quit within the past 15 years.  Have at least a 30-pack-year history of smoking. A pack year is smoking an average of one pack of cigarettes a day for 1 year.  Yearly screening should continue until it has been 15 years since you quit.  Yearly screening should stop if you develop a health problem that would prevent you from having lung cancer treatment.  Breast Cancer  Practice breast self-awareness. This means understanding how your breasts normally appear and feel.  It also means doing regular breast self-exams. Let your health care provider know about any changes, no matter how small.  If you are in your 20s or 30s, you should have a clinical breast exam (CBE) by a health care provider every 1-3 years as part of a regular health exam.  If you are 40 or  older, have a CBE every year. Also consider having a breast X-ray (mammogram) every year.  If you have a family history of breast cancer, talk to your health care provider about genetic screening.  If you are at high risk for breast cancer, talk to your health care provider about having an MRI and a mammogram every year.  Breast cancer gene (BRCA) assessment is recommended for women who have family members with BRCA-related cancers. BRCA-related cancers include:  Breast.  Ovarian.  Tubal.  Peritoneal cancers.  Results of the assessment will determine the need for genetic  counseling and BRCA1 and BRCA2 testing. Cervical Cancer Your health care provider may recommend that you be screened regularly for cancer of the pelvic organs (ovaries, uterus, and vagina). This screening involves a pelvic examination, including checking for microscopic changes to the surface of your cervix (Pap test). You may be encouraged to have this screening done every 3 years, beginning at age 55.  For women ages 72-65, health care providers may recommend pelvic exams and Pap testing every 3 years, or they may recommend the Pap and pelvic exam, combined with testing for human papilloma virus (HPV), every 5 years. Some types of HPV increase your risk of cervical cancer. Testing for HPV may also be done on women of any age with unclear Pap test results.  Other health care providers may not recommend any screening for nonpregnant women who are considered low risk for pelvic cancer and who do not have symptoms. Ask your health care provider if a screening pelvic exam is right for you.  If you have had past treatment for cervical cancer or a condition that could lead to cancer, you need Pap tests and screening for cancer for at least 20 years after your treatment. If Pap tests have been discontinued, your risk factors (such as having a new sexual partner) need to be reassessed to determine if screening should resume. Some women have medical problems that increase the chance of getting cervical cancer. In these cases, your health care provider may recommend more frequent screening and Pap tests. Colorectal Cancer  This type of cancer can be detected and often prevented.  Routine colorectal cancer screening usually begins at 29 years of age and continues through 29 years of age.  Your health care provider may recommend screening at an earlier age if you have risk factors for colon cancer.  Your health care provider may also recommend using home test kits to check for hidden blood in the stool.  A  small camera at the end of a tube can be used to examine your colon directly (sigmoidoscopy or colonoscopy). This is done to check for the earliest forms of colorectal cancer.  Routine screening usually begins at age 40.  Direct examination of the colon should be repeated every 5-10 years through 29 years of age. However, you may need to be screened more often if early forms of precancerous polyps or small growths are found. Skin Cancer  Check your skin from head to toe regularly.  Tell your health care provider about any new moles or changes in moles, especially if there is a change in a mole's shape or color.  Also tell your health care provider if you have a mole that is larger than the size of a pencil eraser.  Always use sunscreen. Apply sunscreen liberally and repeatedly throughout the day.  Protect yourself by wearing long sleeves, pants, a wide-brimmed hat, and sunglasses whenever you are outside. HEART DISEASE, DIABETES,  AND HIGH BLOOD PRESSURE   High blood pressure causes heart disease and increases the risk of stroke. High blood pressure is more likely to develop in:  People who have blood pressure in the high end of the normal range (130-139/85-89 mm Hg).  People who are overweight or obese.  People who are African American.  If you are 19-7 years of age, have your blood pressure checked every 3-5 years. If you are 78 years of age or older, have your blood pressure checked every year. You should have your blood pressure measured twice--once when you are at a hospital or clinic, and once when you are not at a hospital or clinic. Record the average of the two measurements. To check your blood pressure when you are not at a hospital or clinic, you can use:  An automated blood pressure machine at a pharmacy.  A home blood pressure monitor.  If you are between 55 years and 34 years old, ask your health care provider if you should take aspirin to prevent strokes.  Have  regular diabetes screenings. This involves taking a blood sample to check your fasting blood sugar level.  If you are at a normal weight and have a low risk for diabetes, have this test once every three years after 29 years of age.  If you are overweight and have a high risk for diabetes, consider being tested at a younger age or more often. PREVENTING INFECTION  Hepatitis B  If you have a higher risk for hepatitis B, you should be screened for this virus. You are considered at high risk for hepatitis B if:  You were born in a country where hepatitis B is common. Ask your health care provider which countries are considered high risk.  Your parents were born in a high-risk country, and you have not been immunized against hepatitis B (hepatitis B vaccine).  You have HIV or AIDS.  You use needles to inject street drugs.  You live with someone who has hepatitis B.  You have had sex with someone who has hepatitis B.  You get hemodialysis treatment.  You take certain medicines for conditions, including cancer, organ transplantation, and autoimmune conditions. Hepatitis C  Blood testing is recommended for:  Everyone born from [birth year] through 1964.  Anyone with known risk factors for hepatitis C. Sexually transmitted infections (STIs)  You should be screened for sexually transmitted infections (STIs) including gonorrhea and chlamydia if:  You are sexually active and are younger than 29 years of age.  You are older than 29 years of age and your health care provider tells you that you are at risk for this type of infection.  Your sexual activity has changed since you were last screened and you are at an increased risk for chlamydia or gonorrhea. Ask your health care provider if you are at risk.  If you do not have HIV, but are at risk, it may be recommended that you take a prescription medicine daily to prevent HIV infection. This is called pre-exposure prophylaxis (PrEP). You are  considered at risk if:  You are sexually active and do not regularly use condoms or know the HIV status of your partner(s).  You take drugs by injection.  You are sexually active with a partner who has HIV. Talk with your health care provider about whether you are at high risk of being infected with HIV. If you choose to begin PrEP, you should first be tested for HIV. You should then be  tested every 3 months for as long as you are taking PrEP.  PREGNANCY   If you are premenopausal and you may become pregnant, ask your health care provider about preconception counseling.  If you may become pregnant, take 400 to 800 micrograms (mcg) of folic acid every day.  If you want to prevent pregnancy, talk to your health care provider about birth control (contraception). OSTEOPOROSIS AND MENOPAUSE   Osteoporosis is a disease in which the bones lose minerals and strength with aging. This can result in serious bone fractures. Your risk for osteoporosis can be identified using a bone density scan.  If you are 45 years of age or older, or if you are at risk for osteoporosis and fractures, ask your health care provider if you should be screened.  Ask your health care provider whether you should take a calcium or vitamin D supplement to lower your risk for osteoporosis.  Menopause may have certain physical symptoms and risks.  Hormone replacement therapy may reduce some of these symptoms and risks. Talk to your health care provider about whether hormone replacement therapy is right for you.  HOME CARE INSTRUCTIONS   Schedule regular health, dental, and eye exams.  Stay current with your immunizations.   Do not use any tobacco products including cigarettes, chewing tobacco, or electronic cigarettes.  If you are pregnant, do not drink alcohol.  If you are breastfeeding, limit how much and how often you drink alcohol.  Limit alcohol intake to no more than 1 drink per day for nonpregnant women. One  drink equals 12 ounces of beer, 5 ounces of wine, or 1 ounces of hard liquor.  Do not use street drugs.  Do not share needles.  Ask your health care provider for help if you need support or information about quitting drugs.  Tell your health care provider if you often feel depressed.  Tell your health care provider if you have ever been abused or do not feel safe at home.   This information is not intended to replace advice given to you by your health care provider. Make sure you discuss any questions you have with your health care provider.   Document Released: 11/26/2010 Document Revised: 06/03/2014 Document Reviewed: 04/14/2013 Elsevier Interactive Patient Education [employer].

## 2015-09-05 NOTE — Assessment & Plan Note (Signed)
Encouraged her to quit smoking and she is not ready to do that at this time.

## 2015-09-05 NOTE — Progress Notes (Signed)
   Subjective:    Patient ID: Deanna QuillShenika M Smithers, female    DOB: 04/05/1986, 30 y.o.   MRN: [id number]  HPI The patient is a 30 YO female coming in for wellness. Brought BP log with most recent weekly BP 130-140s/80s with 1-2 BP 150/80s. None higher. No new concerns. 2-3 months ago stopped dieting and exercising and has gained back 20 pounds since that time.   [facility], [facility], social history reviewed.   Review of Systems  Constitutional: Negative for fever, activity change, appetite change, fatigue and unexpected weight change.  HENT: Negative.   Eyes: Negative.   Respiratory: Negative for cough, chest tightness, shortness of breath and wheezing.   Cardiovascular: Negative for chest pain, palpitations and leg swelling.  Gastrointestinal: Negative for nausea, abdominal pain, diarrhea, constipation and abdominal distention.  Musculoskeletal: Negative.   Skin: Negative.   Neurological: Negative.   Psychiatric/Behavioral: Positive for sleep disturbance. Negative for behavioral problems, confusion and decreased concentration.      Objective:   Physical Exam  Constitutional: She is oriented to person, place, and time. She appears well-developed and well-nourished.  Overweight  HENT:  Head: Normocephalic and atraumatic.  Eyes: EOM are normal.  Neck: Normal range of motion.  Cardiovascular: Normal rate and regular rhythm.   Pulmonary/Chest: Effort normal and breath sounds normal. No respiratory distress. She has no wheezes. She has no rales.  Abdominal: Soft. Bowel sounds are normal. She exhibits no distension. There is no tenderness. There is no rebound.  Musculoskeletal: She exhibits no edema.  Neurological: She is alert and oriented to person, place, and time. Coordination normal.  Skin: Skin is warm and dry.  Psychiatric: She has a normal mood and affect.   Filed Vitals:   09/05/15 1324 09/05/15 1358  BP: 180/130 170/122  Pulse: 84   Temp: 98.9 F (37.2 C)   TempSrc: Oral   Resp: 16     Height: 5\' 2"  (1.575 m)   Weight: 229 lb (103.874 kg)   SpO2: 98%       Assessment & Plan:

## 2015-09-05 NOTE — Assessment & Plan Note (Signed)
She does have normal BP readings at home and several elevated at the office. Advised her to continue monitoring at home and call if high. She does want to start dieting and exercise again.

## 2015-09-05 NOTE — Progress Notes (Signed)
Pre visit review using our clinic review tool, if applicable. No additional management support is needed unless otherwise documented below in the visit note. 

## 2015-09-05 NOTE — Assessment & Plan Note (Signed)
Ambien 10 mg nightly is not helping as much anymore. Trying lunesta to see if this will help more. She has problems initiating sleep as well as significant problems staying asleep.

## 2015-09-05 NOTE — Assessment & Plan Note (Signed)
Declines flu shot, pap smear up to date. Family history unknown. Tdap up to date. Reminded about need for exercise and to help her weight for her long term health.

## 2015-11-10 ENCOUNTER — Ambulatory Visit (INDEPENDENT_AMBULATORY_CARE_PROVIDER_SITE_OTHER): Payer: 59 | Admitting: Family Medicine

## 2015-11-10 ENCOUNTER — Encounter: Payer: Self-pay | Admitting: Family Medicine

## 2015-11-10 ENCOUNTER — Ambulatory Visit: Payer: 59 | Admitting: Internal Medicine

## 2015-11-10 VITALS — BP 130/88 | HR 71 | Ht 62.0 in | Wt 234.0 lb

## 2015-11-10 DIAGNOSIS — M9902 Segmental and somatic dysfunction of thoracic region: Secondary | ICD-10-CM

## 2015-11-10 DIAGNOSIS — M9904 Segmental and somatic dysfunction of sacral region: Secondary | ICD-10-CM

## 2015-11-10 DIAGNOSIS — M545 Low back pain: Secondary | ICD-10-CM | POA: Diagnosis not present

## 2015-11-10 DIAGNOSIS — G8929 Other chronic pain: Secondary | ICD-10-CM

## 2015-11-10 DIAGNOSIS — M6789 Other specified disorders of synovium and tendon, multiple sites: Secondary | ICD-10-CM | POA: Diagnosis not present

## 2015-11-10 DIAGNOSIS — M9903 Segmental and somatic dysfunction of lumbar region: Secondary | ICD-10-CM | POA: Diagnosis not present

## 2015-11-10 DIAGNOSIS — M76829 Posterior tibial tendinitis, unspecified leg: Secondary | ICD-10-CM

## 2015-11-10 DIAGNOSIS — M549 Dorsalgia, unspecified: Secondary | ICD-10-CM

## 2015-11-10 DIAGNOSIS — M999 Biomechanical lesion, unspecified: Secondary | ICD-10-CM

## 2015-11-10 MED ORDER — TIZANIDINE HCL 4 MG PO TABS: 4.0000 mg | ORAL_TABLET | Freq: Every evening | ORAL | Status: DC

## 2015-11-10 MED ORDER — MELOXICAM 15 MG PO TABS: 15.0000 mg | ORAL_TABLET | Freq: Every day | ORAL | Status: DC

## 2015-11-10 NOTE — Progress Notes (Signed)
Deanna Richards D.O. [facility] [address] [place], [place] [address] Phone: [phone] Subjective:     CC: Ankle pain follow-up. Chronic back pain follow-up  ZHY:[id number]:Subjective Deanna Richards is a 30 y.o. female coming in with complaint of ankle pain.   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 Patient has been in custom orthotics. She has not been wearing these regularly. Having increasing discomfort mostly over the right and left medial aspects of the ankle. Worse on the left side. Patient states that it is starting affecting she works out on a basis. States when she works out more than 1 day a week she has significant pain for 4 days until it seems to resolve. Everything from her feet on October lower back.  Patient is also had chronic back pain and has had this pain for quite some time.  Patient was doing very well when she was working on her weight. Has had difficulty working out secondary to stiffness. Denies any radiation down the legs any numbness or tingling. Rates the severity of pain a 7 out of 10. States that it just feels to all of the time. Has responded to osteopathic manipulation in the past.   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    Past medical history, social, surgical and family history all reviewed in electronic medical record.   Review of Systems: No headache, visual changes, nausea, vomiting, diarrhea, constipation, dizziness, abdominal pain, skin rash, fevers, chills, night sweats, weight loss, swollen lymph nodes, body aches, joint swelling, muscle aches, chest pain, shortness of breath, mood changes.   Objective Blood pressure 130/88, pulse 71, height 5\' 2"  (1.575 m), weight 234 lb (106.142 kg), SpO2 99 %.  General: No apparent distress alert and  oriented x3 mood and affect normal, dressed appropriately.  HEENT: Pupils equal, extraocular movements intact  Respiratory: Patient's speak in full sentences and does not appear short of breath  Cardiovascular: No lower extremity edema, non tender, no erythema  Skin: Warm dry intact with no signs of infection or rash on extremities or on axial skeleton.  Abdomen: Soft nontender  Neuro: Cranial nerves II through XII are intact, neurovascularly intact in all extremities with 2+ DTRs and 2+ pulses.  Lymph: No lymphadenopathy of posterior or anterior cervical chain or axillae bilaterally.  Gait normal with good balance and coordination.  MSK:  Non tender with full range of motion and good stability and symmetric strength and tone of shoulders, elbows, wrist, hip, knee and bilaterally.  Back Exam:  Inspection: Unremarkable  Motion: Flexion 35 deg, Extension 20 deg, Side Bending to 35 deg bilaterally,  Rotation to 45 deg bilaterally  SLR laying: Negative  XSLR laying: Negative  Palpable tenderness: Increased tightness of the paraspinal musculature of the lumbar spine FABER: Positive bilaterally no significant change from previous exam Sensory change: Gross sensation intact to all lumbar and sacral dermatomes.  Reflexes: 2+ at both patellar tendons, 2+ at achilles tendons, Babinski's downgoing.  Strength at foot  Plantar-flexion: 5/5 Dorsi-flexion: 5/5 Eversion: 5/5 Inversion: 5/5  Leg strength  Quad: 5/5 Hamstring: 5/5 Hip flexor: 5/5 mild improvement Hip abductors: 4/5  Gait unremarkable. 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 Range of motion is full in all directions. Tightness of the posterior cord still exists. Strength is 5/5 in all directions. Stable lateral and medial ligaments; squeeze test and kleiger test  unremarkable; Talar dome nontender; No pain at base of 5th MT; No tenderness over cuboid; No tenderness over N spot or navicular prominence Nontender over Achilles No sign of peroneal tendon subluxations or tenderness to palpation  Negative tarsal tunnel tinel's Able to walk 4 steps. Contralateral ankle unremarkable but foot has same breakdown with overpronation.  Osteopathic findings Thoracic T3 extended rotated and side bent left T5 extended rotated and side bent right Lumbar L2 flexed rotated and side bent left Sacrum Left on left      Impression and Recommendations:     This case required medical decision making of moderate complexity.

## 2015-11-10 NOTE — Assessment & Plan Note (Signed)
Having increasing chronic back pain. Patient is having some tightness. We discussed meloxicam as well as a muscle relaxer if needed. Will be sent to formal physical therapy. Mild osteoarthritic changes noted. Patient does respond really well to osteopathic manipulation will continue to see patient back again in 4 weeks.

## 2015-11-10 NOTE — Patient Instructions (Signed)
Good to see you  Deanna Richards 20 minutes 2 times daily. Usually after activity and before bed. Meloxicam daily for 10 days then as needed Zanaflex at night if you need it and do not take lunesta at the same time Physical therapy will be calling you  Deanna BecketDamien rudulfo could be good.  Iron 65 mg daily with 500mg  of vitaminC See me again in 4 weeks.

## 2015-11-10 NOTE — Assessment & Plan Note (Signed)
Continues to have pain in this time. We discussed icing regimen and home exercises. Patient will be sent to formal physical therapy. Made adjustments to her custom orthotics. We discussed the importance of weight loss. Follow-up again in 4 weeks.

## 2015-11-10 NOTE — Assessment & Plan Note (Signed)
Decision today to treat with OMT was based on Physical Exam  After verbal consent patient was treated with HVLA, ME techniques in cervical, thoracic, lumbar and sacral areas  Patient tolerated the procedure well with improvement in symptoms  Patient given exercises, stretches and lifestyle modifications  See medications in patient instructions if given  Patient will follow up in 4-5 weeks                                    

## 2015-11-10 NOTE — Progress Notes (Signed)
Pre visit review using our clinic review tool, if applicable. No additional management support is needed unless otherwise documented below in the visit note. 

## 2015-11-15 ENCOUNTER — Ambulatory Visit: Payer: 59 | Attending: Family Medicine | Admitting: Physical Therapy

## 2015-11-15 DIAGNOSIS — M545 Low back pain: Secondary | ICD-10-CM | POA: Diagnosis present

## 2015-11-15 DIAGNOSIS — M25572 Pain in left ankle and joints of left foot: Secondary | ICD-10-CM | POA: Diagnosis present

## 2015-11-15 DIAGNOSIS — R293 Abnormal posture: Secondary | ICD-10-CM | POA: Diagnosis present

## 2015-11-15 DIAGNOSIS — M62830 Muscle spasm of back: Secondary | ICD-10-CM | POA: Diagnosis present

## 2015-11-15 NOTE — Therapy (Addendum)
[place], [place], [address] Phone: [phone]   Fax:  [phone]  Physical Therapy Evaluation / Discharge  Patient Details  Name: Deanna Richards MRN: [id number] Date of Birth: November 17, 1985 Referring Provider: Hulan Saas DO  Encounter Date: 11/15/2015      PT End of Session - 11/15/15 1747    Visit Number 1   Number of Visits 17   Date for PT Re-Evaluation 01/10/16   PT Start Time 6269   PT Stop Time 1638   PT Time Calculation (min) 49 min   Activity Tolerance Patient tolerated treatment well   Behavior During Therapy [facility] for tasks assessed/performed      Past Medical History  Diagnosis Date  . PCOS (polycystic ovarian syndrome)     no current med.  . 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 No past surgeries    . Carpal tunnel release Right 11/05/2013    Procedure: CARPAL TUNNEL RELEASE ENDOSCOPIC RIGHT WRIST;  Surgeon: Renette Butters, MD;  Location: [facility];  Service: Orthopedics;  Laterality: Right;    There were no vitals filed for this visit.       Subjective Assessment - 11/15/15 1556    Subjective pt is a 30 y.o F with CC of low back and L ankle pain. The low back pain started 5-6 years ago pt is unable to determine what started the pain. since onset the pain has gotten worse espcially over the last couple years. intermittent pain down the LLE to the proximal knee. The L foot has been going on about 1 year with hx of achilles tear. reports never felt the same, pain seems to fluctuates, swelling. pain stays in the inside of the ankle and acorss the top    Limitations Sitting;Lifting;Walking;House hold activities;Standing   How long can you sit comfortably? 5 min   How long can you stand comfortably? 20 min   How long can you walk comfortably? 25 min   Diagnostic tests [place] on L ankle 02/03/2015, low back  x-ray couple years ago   Patient Stated Goals keep away from having to take medication, exercise longer, decrease pain, walking/ standing longer,   Currently in Pain? Yes   Pain Score 5    Pain Location Back   Pain Orientation Lower;Right;Left;Mid   Pain Descriptors / Indicators Pressure;Tightness;Cramping;Stabbing   Pain Type Chronic pain   Pain Radiating Towards intermittently to the L proximal knee   Pain Onset More than a month ago   Pain Frequency Constant   Aggravating Factors  prolonged standing, sitting, being on feet, working out   Pain Relieving Factors Ice, Laying/ sitting, old exercises from previous PT, stretching, whirlpool, massage   Effect of Pain on Daily Activities limited endurance with weight bearing activites   Multiple Pain Sites Yes   Pain Score 0  but can go up to a 10/10 at times   Pain Location Ankle   Pain Orientation Left   Pain Descriptors / Indicators Burning  difficulty describing the pain   Pain Type Chronic pain   Pain Onset More than a month ago   Pain Frequency Intermittent   Aggravating Factors  exercise, walking/ standing,    Pain Relieving Factors sitting and resting, icing,             [facility] PT Assessment - 11/15/15 1611    Assessment   Medical  Diagnosis Low back pain, L posterior tibilais tendinitis   Referring Provider Hulan Saas DO   Onset Date/Surgical Date --  Low Back 5-6 years ago, L ankle 1 year ago   Hand Dominance Right   Next MD Visit 12/11/2015   Prior Therapy yes  for low back   Precautions   Precautions None   Restrictions   Weight Bearing Restrictions No   Balance Screen   Has the patient fallen in the past 6 months No   Has the patient had a decrease in activity level because of a fear of falling?  No   Is the patient reluctant to leave their home because of a fear of falling?  No   Home Environment   Living Environment Private residence   Living Arrangements Spouse/significant other   Available Help at  Discharge Available PRN/intermittently   Type of [place] to enter   Entrance Stairs-Number of Steps 36   Entrance Stairs-Rails Can reach both   [place] One level   [place] Other (comment)  orthotics   Prior Function   Level of [place] Independent;Independent with basic ADLs   Vocation Full time employment;Part time employment  full time social work, part time Production manager social work walking/ standing, Sams prolonged sitting   Leisure zumba, exercise   Cognition   Overall Cognitive Status Within Functional Limits for tasks assessed   Observation/Other Assessments   Focus on Therapeutic Outcomes (FOTO)  45% limited   31% limited    Posture/Postural Control   Posture/Postural Control Postural limitations   Postural Limitations Rounded Shoulders;Forward head;Anterior pelvic tilt;Increased lumbar lordosis   ROM / Strength   AROM / PROM / Strength AROM;PROM;Strength   AROM   AROM Assessment Site Ankle;Lumbar   Right/Left Ankle Right;Left   Right Ankle Dorsiflexion 10   Right Ankle Plantar Flexion 50   Right Ankle Inversion 24   Right Ankle Eversion 16   Left Ankle Dorsiflexion 6   Left Ankle Plantar Flexion 44   Left Ankle Inversion 22   Left Ankle Eversion 12   Lumbar Flexion 105  ERP   Lumbar Extension 30   Lumbar - Right Side Bend 32   Lumbar - Left Side Bend 34  pain during movement   Strength   Overall Strength Comments specific weakness of the L posterior tibialis at 4-/5    Strength Assessment Site Ankle   Right/Left Ankle Right;Left   Right Ankle Dorsiflexion 4+/5   Right Ankle Plantar Flexion 4+/5   Right Ankle Inversion 4+/5   Right Ankle Eversion 4+/5   Left Ankle Dorsiflexion 4-/5   Left Ankle Plantar Flexion 4+/5   Left Ankle Inversion 4/5   Left Ankle Eversion 4/5   Palpation   Palpation comment tenderness along the L1-L5 with increase soreness at L4. tightness along bil lumbar paraspinals  with R>L, and tighntess of L glute max. L ankle tenderness of  L tibialis posterior tendon and muscle belly.    Special Tests    Special Tests Lumbar;Ankle/Foot Special Tests   Lumbar Tests Straight Leg Raise;Prone Knee Bend Test   Ankle/Foot Special Tests  Talar Tilt Test;Anterior Drawer Test   Prone Knee Bend Test   Findings Negative   Straight Leg Raise   Findings Negative   Anterior Drawer Test   Findings Negative   Side  --  bil   Talar Tilt Test    Findings Negative   Side  --  bil                           PT Education - 11/15/15 1746    Education provided Yes   Education Details evaluation findings, anatomy of the lumbar spine with focus on the multifidus/ and the posterior tibialis, POC, goals, HEP with proper form and treatment rationale   Person(s) Educated Patient   Methods Explanation;Handout;Verbal cues   Comprehension Verbalized understanding;Verbal cues required          PT Short Term Goals - 11/15/15 1759    PT SHORT TERM GOAL #1   Title pt will be I with inital HEP (12/15/2015)   Time 4   Period Weeks   Status New   PT SHORT TERM GOAL #2   Title pt will demonstrate proper posture and lifting/ carrying mechanics to reduce and prevent low back pain (12/15/2015)   Time 4   Period Weeks   Status New   PT SHORT TERM GOAL #3   Title pt will demonstrate reduce tightness of the lumbar paraspinals to reduce pain to </= 4/10 and to promote functional mobliity (12/15/2015)   Time 4   Period Weeks   Status New   PT SHORT TERM GOAL #4   Title pt will improve L ankle DF and tibialis posterior strength to >/= 4/5 to assist with supporting the arching and assist with functional endurance (12/15/2015)   Time 4   Period Weeks   Status New   PT SHORT TERM GOAL #5   Title pt will be able to sit for >/= 10 minutes with </= 4/10 pain to assist with functional progression (12/15/2015)   Time 4   Period Weeks   Status New           PT Long Term  Goals - 11/15/15 1804    PT LONG TERM GOAL #1   Title pt will be I with all HEP given, as of last visit (01/10/2016)   Time 8   Period Weeks   Status New   PT LONG TERM GOAL #2   Title pt will be able to stand/ walk for >/= 45 min with </= 2/10 pain for functional endurance required for social work activities and ADLs (01/10/2016)   Time 4   Period Weeks   Status New   PT LONG TERM GOAL #3   Title pt will be able to sit for >/= 20 min with </= 2/10 pain to assist her job at [employer] (01/10/2016)   Time 4   Period Weeks   Status New   PT LONG TERM GOAL #4   Title pt will improve overall L ankle strength to >/= 4+5 to promote stability of the ankle and promote natural arch support to assist decrease pain with prolonged weight bearing activity    Time 8   Period Weeks   Status New   PT LONG TERM GOAL #5   Title pt will improve her FOTO score to >/= 31% limited to demonstrate improvement in function at discharge (01/10/2016)   Additional Long Term Goals   Additional Long Term Goals Yes   PT LONG TERM GOAL #6   Title pt will be to exercise at the gym >/= 2 x a week with </= 2/10 pain in the low back to assist with pt's personal goal of getting back into the gym more consistently (01/10/2016)   Time 8   Period Weeks   Status New  Plan - 11/15/15 1748    Clinical Impression Statement Mrs. Vogelsang presents to OPPT as a low complexity evaluation with CC of chronic LBP and L ankle pain. She demonstrate functional trunk mobility with pain with end range flexion and L side bending. tightess noted in bil parapsionals with R >L and tenderness upon palpation in L1-L5 with greatest soreness at L4. L ankle AROM was within funcitonal limits with no pain during assessment. MMT revealed mild weakness with DF and isolating the tibialis posterior compared bil. She would benefit from  physical therapy to improve R ankle strength, improve walking/ standing endurnace, decrease tightness in  the low back, and return pt to PLOF by addressing the impairments listed.    Rehab Potential Good   PT Frequency 2x / week   PT Duration 8 weeks   PT Treatment/Interventions ADLs/Self Care Home Management;Dry needling;Taping;Manual techniques;Therapeutic exercise;Moist Heat;Therapeutic activities;Iontophoresis 49m/ml Dexamethasone;Electrical Stimulation;Cryotherapy;Ultrasound;Patient/family education;Passive range of motion   PT Next Visit Plan assess/ review HEP, DN for lumbar spine, STW to calm down low back tightness, core strengthening (pt was concerned having hx of cramping in her abs), ankle/ foot strengthening, posture education, Modalities PRN   PT Home Exercise Plan 4- way ankle strengthening, lower trunk rotation, hamstring stretch, calf strech, childs pose   Consulted and Agree with Plan of Care Patient      Patient will benefit from skilled therapeutic intervention in order to improve the following deficits and impairments:  Abnormal gait, Obesity, Pain, Hypomobility, Decreased strength, Increased fascial restricitons, Increased muscle spasms, Decreased endurance, Decreased activity tolerance, Improper body mechanics, Postural dysfunction  Visit Diagnosis: Bilateral low back pain, with sciatica presence unspecified - Plan: PT plan of care cert/re-cert  Muscle spasm of back - Plan: PT plan of care cert/re-cert  Pain in left ankle and joints of left foot - Plan: PT plan of care cert/re-cert  Abnormal posture - Plan: PT plan of care cert/re-cert     Problem List Patient Active Problem List   Diagnosis Date Noted  . Routine general medical examination at a health care facility 09/05/2015  . 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 INSOMNIA 05/29/2010  . Morbid obesity ([place] 02/22/2010  . SMOKER 02/22/2010  . Essential hypertension 02/22/2010   KStarr LakePT, DPT, LAT, ATC  11/15/2015  6:15 PM      [facility] [address]Phone: [phone]  Fax:  [phone] Name: SDESTINA MANTEIMRN: [id number] of Birth: [birth year]   PHYSICAL THERAPY DISCHARGE SUMMARY  Visits from Start of Care: 1  Current functional level related to goals / functional outcomes: unknown   Remaining deficits: unknown   Education / Equipment: unknown  Plan:                                                    Patient goals were not met. Patient is being discharged due to meeting the stated rehab goals.  ?????    Antavion Bartoszek PT, DPT, LAT, ATC  01/02/16  2:00 PM

## 2015-12-11 ENCOUNTER — Ambulatory Visit: Payer: 59 | Admitting: Family Medicine

## 2016-01-04 NOTE — Progress Notes (Signed)
Tawana ScaleZach Smith D.O. [facility] [address] [place], [place] [address] Phone: [phone] Subjective:     CC: Ankle pain follow-up. Chronic back pain follow-up  WGN:[id number]:Subjective  Deanna Richards is a 30 y.o. female coming in with complaint of ankle pain.   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 Patient has been in custom orthotics. She has not been wearing these regularly. Patient was having worsening symptoms as last exam.  Patient is also had chronic back pain and has had this pain for quite some time.  We attempted osteopathic manipulation 6 weeks ago.patient has been to rehabilitation one time since last visit. Patient states   Pertinent past medical history  Patient does have a x-rays back in March 2016 showing some mild facet hypertrophy bilaterally at L4-L5 and L5-S1 with mild arthritis.    Past medical history, social, surgical and family history all reviewed in electronic medical record.   Review of Systems: No headache, visual changes, nausea, vomiting, diarrhea, constipation, dizziness, abdominal pain, skin rash, fevers, chills, night sweats, weight loss, swollen lymph nodes, body aches, joint swelling, muscle aches, chest pain, shortness of breath, mood changes.   Objective  There were no vitals taken for this visit.  General: No apparent distress alert and oriented x3 mood and affect normal, dressed appropriately.  HEENT: Pupils equal, extraocular movements intact  Respiratory: Patient's speak in full sentences and does not appear short of breath  Cardiovascular: No lower extremity edema, non tender, no erythema  Skin: Warm dry intact with no signs of infection or rash on extremities or on axial skeleton.  Abdomen: Soft nontender  Neuro: Cranial nerves II through XII are intact, neurovascularly intact in all extremities with 2+ DTRs and 2+ pulses.  Lymph: No lymphadenopathy of  posterior or anterior cervical chain or axillae bilaterally.  Gait normal with good balance and coordination.  MSK:  Non tender with full range of motion and good stability and symmetric strength and tone of shoulders, elbows, wrist, hip, knee and bilaterally.  Back Exam:  Inspection: Unremarkable  Motion: Flexion 35 deg, Extension 20 deg, Side Bending to 35 deg bilaterally,  Rotation to 45 deg bilaterally  SLR laying: Negative  XSLR laying: Negative  Palpable tenderness: Increased tightness of the paraspinal musculature of the lumbar spine FABER: Positive bilaterally no significant change from previous exam Sensory change: Gross sensation intact to all lumbar and sacral dermatomes.  Reflexes: 2+ at both patellar tendons, 2+ at achilles tendons, Babinski's downgoing.  Strength at foot  Plantar-flexion: 5/5 Dorsi-flexion: 5/5 Eversion: 5/5 Inversion: 5/5  Leg strength  Quad: 5/5 Hamstring: 5/5 Hip flexor: 5/5 mild improvement Hip abductors: 4/5  Gait unremarkable. 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 Continues to have posterior tibialis insufficiency  Range of motion is full in all directions. Tightness of the posterior cord still exists. Strength is 5/5 in all directions. Stable lateral and medial ligaments; squeeze test and kleiger test unremarkable; Talar dome nontender; No pain at base of 5th MT; No tenderness over cuboid; No tenderness over N spot or navicular prominence Nontender over Achilles No sign of peroneal tendon subluxations or tenderness to palpation  Negative tarsal tunnel tinel's Able to walk 4 steps.   Osteopathic findings Thoracic T3 extended rotated and side bent left T5 extended rotated and side bent right Lumbar L2 flexed rotated and side bent left  Sacrum Left on left      Impression and Recommendations:     This case required medical decision making of moderate  complexity.

## 2016-01-05 ENCOUNTER — Encounter: Payer: Self-pay | Admitting: Family Medicine

## 2016-01-05 ENCOUNTER — Ambulatory Visit (INDEPENDENT_AMBULATORY_CARE_PROVIDER_SITE_OTHER): Payer: 59 | Admitting: Family Medicine

## 2016-01-05 VITALS — BP 148/96 | HR 90 | Resp 16 | Wt 235.0 lb

## 2016-01-05 DIAGNOSIS — M9901 Segmental and somatic dysfunction of cervical region: Secondary | ICD-10-CM | POA: Diagnosis not present

## 2016-01-05 DIAGNOSIS — M9903 Segmental and somatic dysfunction of lumbar region: Secondary | ICD-10-CM

## 2016-01-05 DIAGNOSIS — M9902 Segmental and somatic dysfunction of thoracic region: Secondary | ICD-10-CM

## 2016-01-05 DIAGNOSIS — M6789 Other specified disorders of synovium and tendon, multiple sites: Secondary | ICD-10-CM

## 2016-01-05 DIAGNOSIS — M76829 Posterior tibial tendinitis, unspecified leg: Secondary | ICD-10-CM

## 2016-01-05 DIAGNOSIS — M999 Biomechanical lesion, unspecified: Secondary | ICD-10-CM

## 2016-01-05 NOTE — Patient Instructions (Signed)
God to see you  Gustavus Bryantce is your friend Stay active get back in the gym We will get you over to select physical therapy  Keep wearing the orthotics See me again in 6 weeks.

## 2016-01-05 NOTE — Assessment & Plan Note (Signed)
Continues to have some mild discomfort from time to time. Patient will be sent to formal physical therapy she has responded in the past. Patient encouraged to continue the over-the-counter orthotics. When patient is treating her ankle she seems to be helping her back as well.

## 2016-01-05 NOTE — Assessment & Plan Note (Signed)
Decision today to treat with OMT was based on Physical Exam  After verbal consent patient was treated with HVLA, ME techniques in cervical, thoracic, lumbar and sacral areas  Patient tolerated the procedure well with improvement in symptoms  Patient given exercises, stretches and lifestyle modifications  See medications in patient instructions if given  Patient will follow up in 4-8 weeks                                      

## 2016-01-05 NOTE — Progress Notes (Signed)
Pre visit review using our clinic review tool, if applicable. No additional management support is needed unless otherwise documented below in the visit note. 

## 2016-01-05 NOTE — Assessment & Plan Note (Signed)
Patient is comfortable. Encourage her though to lose weight which will be beneficial for back and knees and ankles.

## 2016-01-24 ENCOUNTER — Encounter: Payer: Self-pay | Admitting: Student

## 2016-01-31 ENCOUNTER — Ambulatory Visit (INDEPENDENT_AMBULATORY_CARE_PROVIDER_SITE_OTHER): Payer: 59 | Admitting: Internal Medicine

## 2016-01-31 ENCOUNTER — Telehealth: Payer: Self-pay | Admitting: Internal Medicine

## 2016-01-31 ENCOUNTER — Encounter: Payer: Self-pay | Admitting: Internal Medicine

## 2016-01-31 VITALS — BP 120/70 | HR 98 | Temp 98.1°F | Resp 20 | Wt 237.0 lb

## 2016-01-31 DIAGNOSIS — H6692 Otitis media, unspecified, left ear: Secondary | ICD-10-CM | POA: Diagnosis not present

## 2016-01-31 DIAGNOSIS — J309 Allergic rhinitis, unspecified: Secondary | ICD-10-CM | POA: Insufficient documentation

## 2016-01-31 DIAGNOSIS — I10 Essential (primary) hypertension: Secondary | ICD-10-CM | POA: Diagnosis not present

## 2016-01-31 MED ORDER — PREDNISONE 10 MG PO TABS: ORAL_TABLET | ORAL | 0 refills | Status: DC

## 2016-01-31 MED ORDER — CEFDINIR 300 MG PO CAPS: 300.0000 mg | ORAL_CAPSULE | Freq: Two times a day (BID) | ORAL | 0 refills | Status: DC

## 2016-01-31 MED ORDER — METHYLPREDNISOLONE ACETATE 80 MG/ML IJ SUSP
80.0000 mg | Freq: Once | INTRAMUSCULAR | Status: AC
Start: 2016-01-31 — End: 2016-01-31
  Administered 2016-01-31: 80 mg via INTRAMUSCULAR

## 2016-01-31 NOTE — Patient Instructions (Signed)
You had the steroid shot today  Please take all new medication as prescribed - the antibiotic, and the prednisone  Please continue all other medications as before, including the flonase  You can also take Sudafed OTC, and/or Mucinex twice per day (which can help the ear stopped up as well)  Please have the pharmacy call with any other refills you may need.  Please keep your appointments with your specialists as you may have planned

## 2016-01-31 NOTE — Telephone Encounter (Signed)
PLEASE NOTE: All timestamps contained within this report are represented as [place] Standard Time. CONFIDENTIALTY NOTICE: This fax transmission is intended only for the addressee. It contains information that is legally privileged, confidential or otherwise protected from use or disclosure. If you are not the intended recipient, you are strictly prohibited from reviewing, disclosing, copying using or disseminating any of this information or taking any action in reliance on or regarding this information. If you have received this fax in error, please notify us immediately by telephone so that we can arrange for its return to us. Phone: [phone], Toll-Free: [phone], Fax: [phone] Page: 1 of 1 Call Id: [id number] [facility] - Client TELEPHONE ADVICE RECORD [facility] Patient Name: Deanna CoffinSHENIKA Richards DOB: 01/27/1986 Initial Comment Caller states having left ear pain Nurse Assessment Nurse: Debera Latalston, RN, Tinnie GensJeffrey Date/Time (Eastern Time): 01/31/2016 8:22:53 AM Confirm and document reason for call. If symptomatic, describe symptoms. You must click the next button to save text entered. ---Caller states having left ear pain. Pain started yesterday. No fever. No discharge. Has the patient traveled out of the country within the last 30 days? ---No Does the patient have any new or worsening symptoms? ---Yes Will a triage be completed? ---Yes Related visit to physician within the last 2 weeks? ---No Does the PT have any chronic conditions? (i.e. diabetes, asthma, etc.) ---No Is the patient pregnant or possibly pregnant? (Ask all females between the ages of 6412-55) ---No Is this a behavioral health or substance abuse call? ---No Guidelines Guideline Title Affirmed Question Affirmed Notes Earache Earache (Exceptions: brief ear pain of < 60 minutes duration, earache occurring during air travel Final Disposition User See Physician within 24 Hours Debera Latalston,  RN, Abbott LaboratoriesJeffrey Referrals REFERRED TO PCP OFFICE Disagree/Comply: Danella Maiersomply

## 2016-01-31 NOTE — Progress Notes (Signed)
Subjective:    Patient ID: Deanna Richards, female    DOB: 11/08/1985, 30 y.o.   MRN: [id number]  HPI   Here with 2-3 days acute onset fever, left ear pain, pressure, headache, general weakness and malaise, and occ non prod cough, but pt denies chest pain, wheezing, increased sob or doe, orthopnea, PND, increased LE swelling, palpitations, dizziness or syncope. Does have several wks ongoing nasal allergy symptoms with clearish congestion, itch and sneezing, without fever, pain, ST, cough, swelling or wheezing.  Pt denies new neurological symptoms such as new headache, or facial or extremity weakness or numbness   Pt denies polydipsia, polyuria,  Past Medical History:  Diagnosis Date  . 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 PCOS (polycystic ovarian syndrome)    no current med.   Past Surgical History:  Procedure Laterality Date  . CARPAL TUNNEL RELEASE Right 11/05/2013   Procedure: CARPAL TUNNEL RELEASE ENDOSCOPIC RIGHT WRIST;  Surgeon: Sheral Apley, MD;  Location: [facility];  Service: Orthopedics;  Laterality: Right;  . NO PAST SURGERIES      reports that she has been smoking Cigars.  She has smoked for the past 5.00 years. She has never used smokeless tobacco. She reports that she drinks alcohol. She reports that she does not use drugs. family history includes Hypertension in her mother. She was adopted. No Known Allergies Current Outpatient Prescriptions on File Prior to Visit  Medication Sig Dispense Refill  . Eszopiclone 3 MG TABS Take 1 tablet (3 mg total) by mouth at bedtime. Take immediately before bedtime 30 tablet 3  . fluticasone (FLONASE) 50 MCG/ACT nasal spray Place 2 sprays into both nostrils daily. 16 g 6  . folic acid (FOLVITE) 1 MG tablet Take 1 mg by mouth daily.    . meloxicam (MOBIC) 15 MG tablet Take 1 tablet (15 mg total) by mouth daily. 30 tablet 0  . norgestimate-ethinyl estradiol (MONONESSA) 0.25-35 MG-MCG tablet Take 1 tablet by mouth  daily.    Marland Kitchen tiZANidine (ZANAFLEX) 4 MG tablet Take 1 tablet (4 mg total) by mouth Nightly. 30 tablet 2   No current facility-administered medications on file prior to visit.    Review of Systems  Constitutional: Negative for unusual diaphoresis or night sweats HENT: Negative for ear swelling or discharge Eyes: Negative for worsening visual haziness  Respiratory: Negative for choking and stridor.   Gastrointestinal: Negative for distension or worsening eructation Genitourinary: Negative for retention or change in urine volume.  Musculoskeletal: Negative for other MSK pain or swelling Skin: Negative for color change and worsening wound Neurological: Negative for tremors and numbness other than noted  Psychiatric/Behavioral: Negative for decreased concentration or agitation other than above       Objective:   Physical Exam BP 120/70   Pulse 98   Temp 98.1 F (36.7 C) (Oral)   Resp 20   Wt 237 lb (107.5 kg)   SpO2 98%   BMI 43.35 kg/m  VS noted, mild ill Constitutional: Pt appears in no apparent distress HENT: Head: NCAT.  Right Ear: External ear normal.  Left Ear: External ear normal.  Left tm's with severe erythema, slight bulging.  .  Max sinus areas mild tender.  Pharynx with mild erythema, no exudate Eyes: . Pupils are equal, round, and reactive to light. Conjunctivae and EOM are normal Neck: Normal range of motion. Neck supple.  Cardiovascular: Normal rate and regular rhythm.   Pulmonary/Chest: Effort normal and breath sounds without  rales or wheezing.  Neurological: Pt is alert. Not confused , motor grossly intact Skin: Skin is warm. No rash, no LE edema Psychiatric: Pt behavior is normal. No agitation.     Assessment & Plan:

## 2016-01-31 NOTE — Progress Notes (Signed)
Pre visit review using our clinic review tool, if applicable. No additional management support is needed unless otherwise documented below in the visit note. 

## 2016-02-04 NOTE — Assessment & Plan Note (Signed)
Mild to mod, for flonase restart,  to f/u any worsening symptoms or concerns 

## 2016-02-04 NOTE — Assessment & Plan Note (Signed)
stable overall by history and exam, recent data reviewed with pt, and pt to continue medical treatment as before,  to f/u any worsening symptoms or concerns BP Readings from Last 3 Encounters:  01/31/16 120/70  01/05/16 (!) 148/96  11/10/15 130/88

## 2016-02-04 NOTE — Assessment & Plan Note (Signed)
Mild to mod, for antibx course,  to f/u any worsening symptoms or concerns 

## 2016-02-05 ENCOUNTER — Telehealth: Payer: Self-pay | Admitting: Emergency Medicine

## 2016-02-05 MED ORDER — NEOMYCIN-POLYMYXIN-HC 3.5-10000-1 OT SOLN: 3.0000 [drp] | Freq: Three times a day (TID) | OTIC | 0 refills | Status: DC

## 2016-02-05 NOTE — Telephone Encounter (Signed)
Patient aware.

## 2016-02-05 NOTE — Telephone Encounter (Signed)
Pt called and stated you gave her something for an infection . She started taking it and couldn't handle the side effects so she stopped. Can you prescribe her something different? If so the pharmacy is [facility]. Thanks.

## 2016-02-05 NOTE — Telephone Encounter (Signed)
Looks like you gave her cefdinir on 01/31/16. Is there anything else she can take?

## 2016-02-05 NOTE — Telephone Encounter (Signed)
Dr. Jonny RuizJohn prescribed this medication. I have sent in ear drops to use 3 drops TID left ear for 3 days to help symptoms.

## 2016-05-01 ENCOUNTER — Other Ambulatory Visit: Payer: Self-pay | Admitting: Internal Medicine

## 2016-05-01 NOTE — Telephone Encounter (Signed)
Sent to pharmacy 

## 2016-05-17 IMAGING — US US ABDOMEN COMPLETE
1 series · 14 of 25 positions shown · non-contrast
Comparison: None.

CLINICAL DATA: Chronic right lower quadrant pain 2 years.

EXAM:
ULTRASOUND ABDOMEN COMPLETE

[Series 1: us abdomen complete · 0.27mm/px · 14 of 86 slices shown]
[im 1/86]
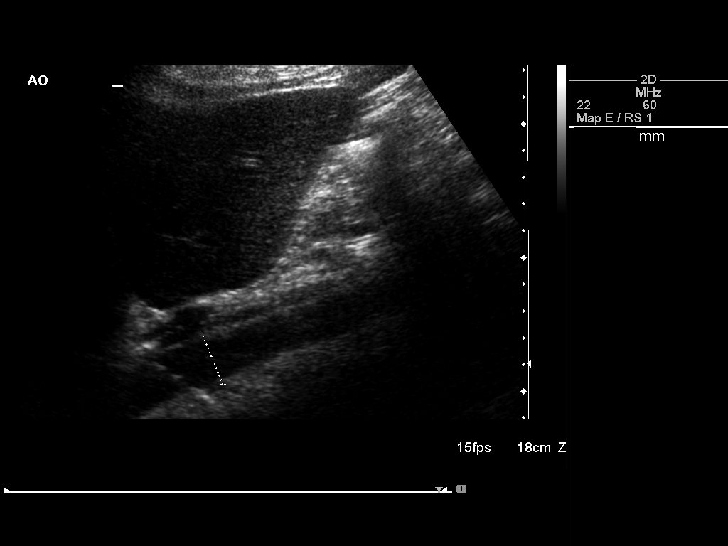
[im 8/86]
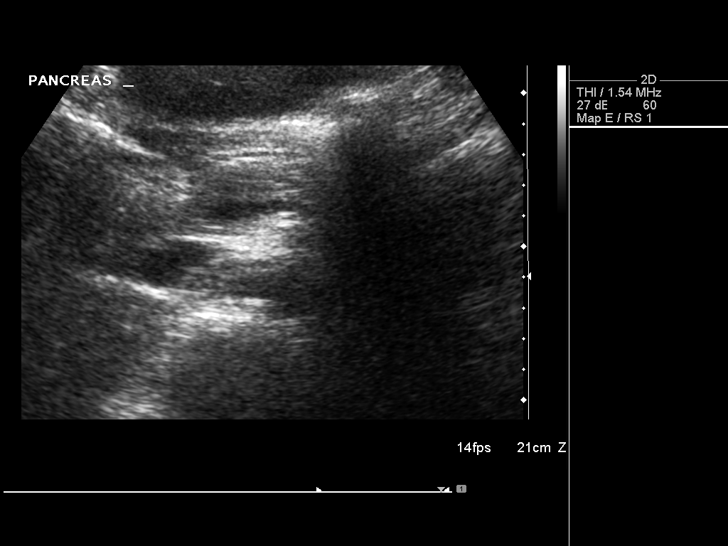
[im 15/86]
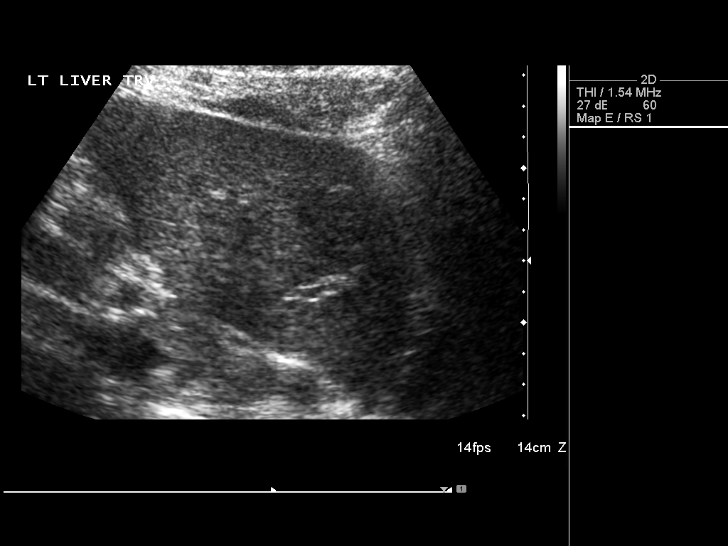
[im 22/86]
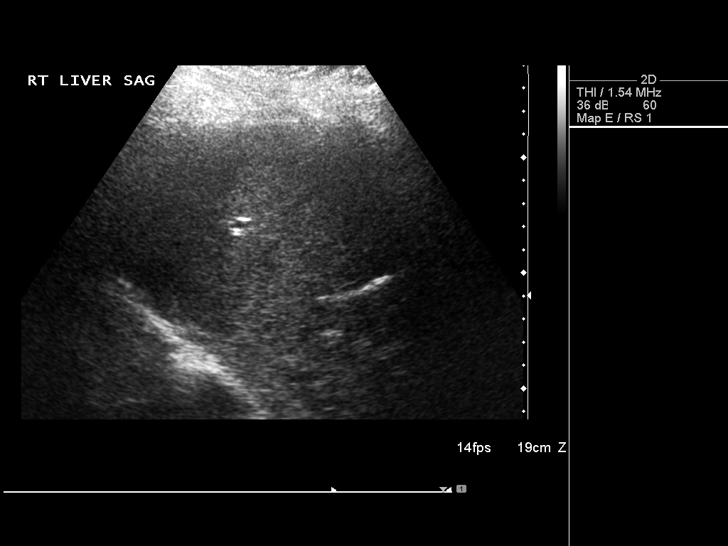
[im 29/86]
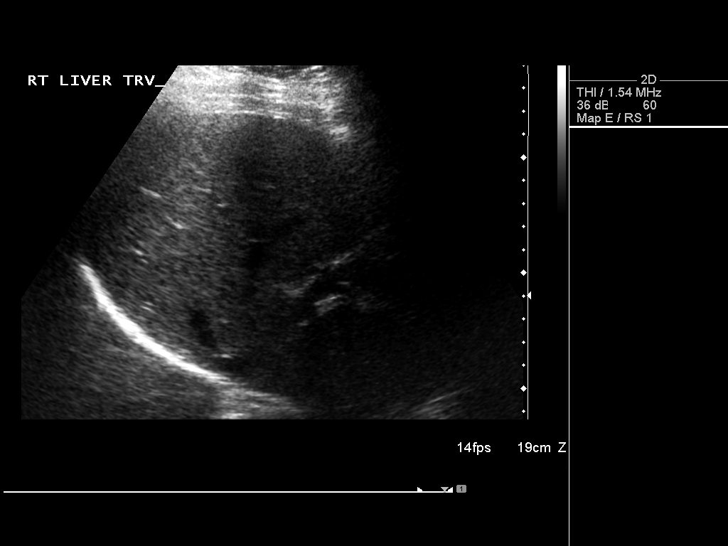
[im 32/86]
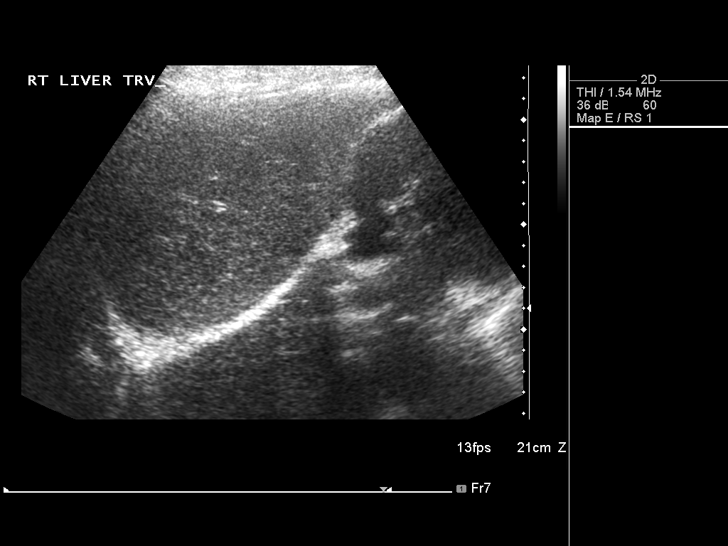
[im 39/86]
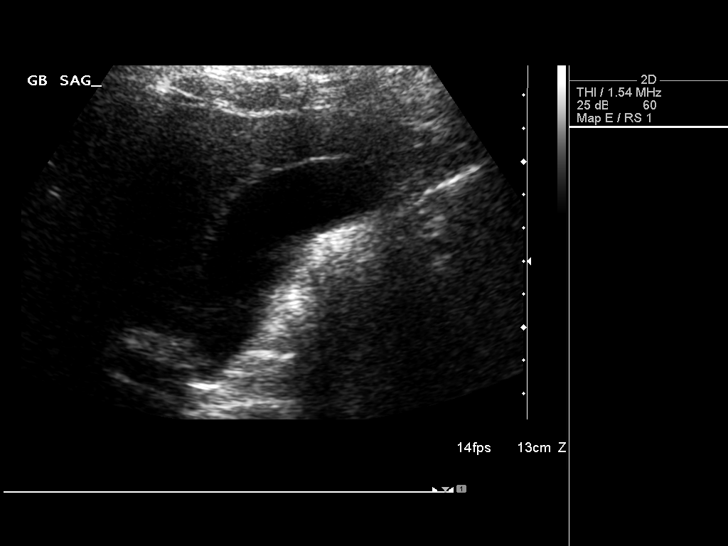
[im 47/86]
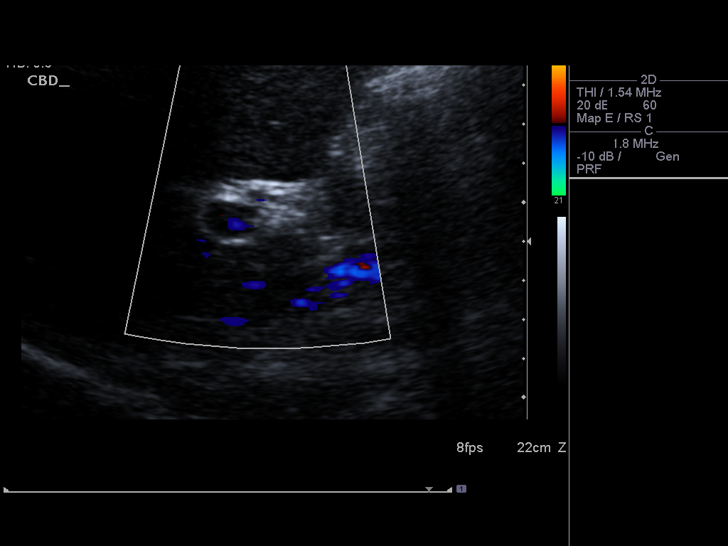
[im 54/86]
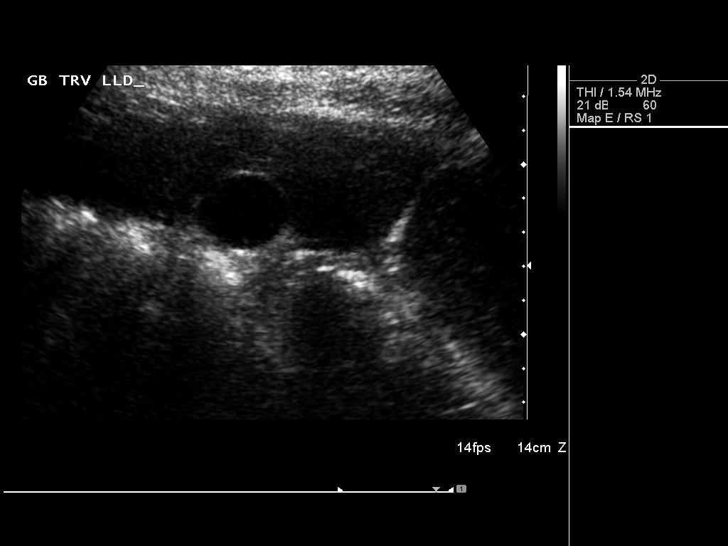
[im 57/86]
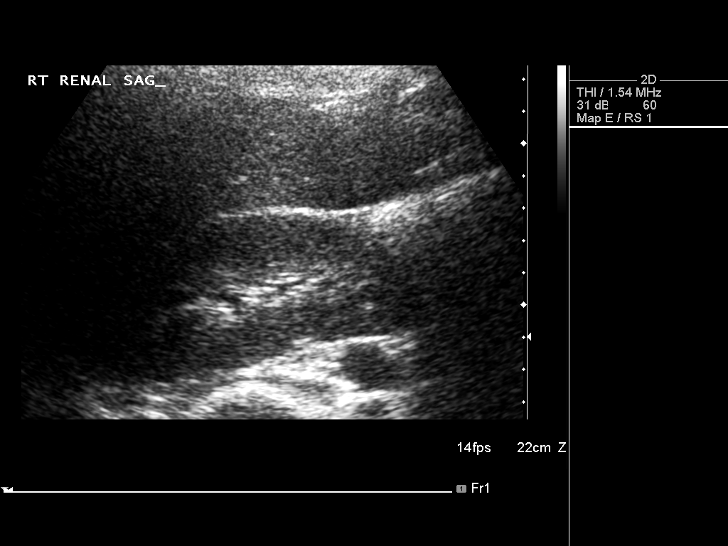
[im 64/86]
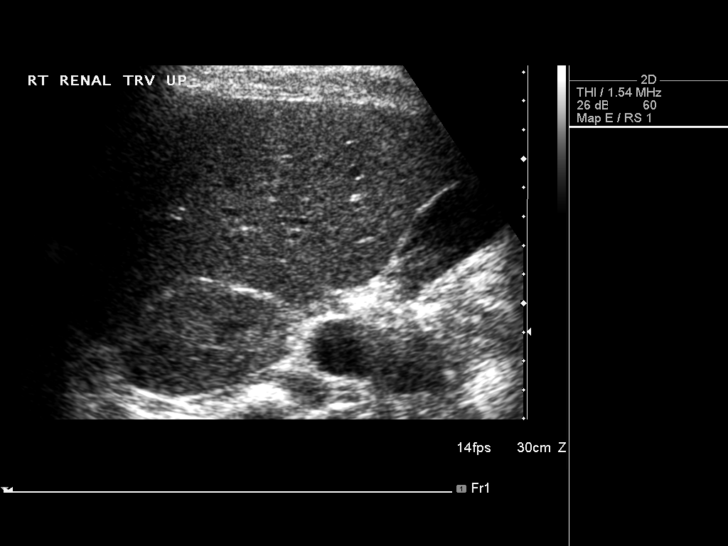
[im 71/86]
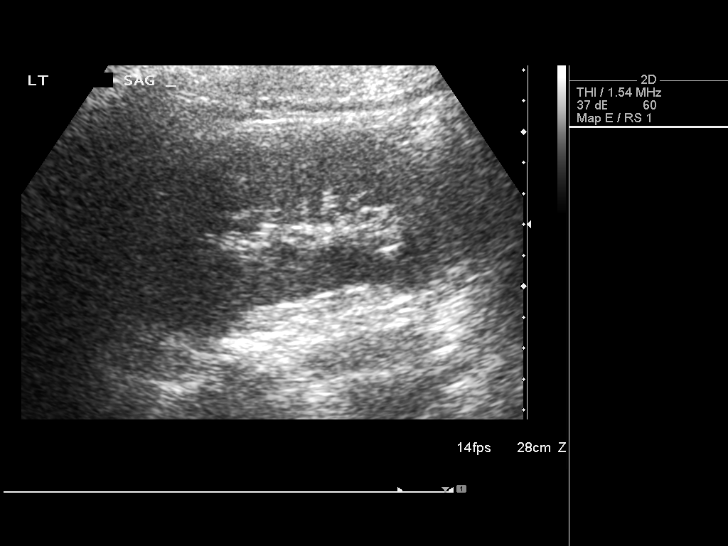
[im 78/86]
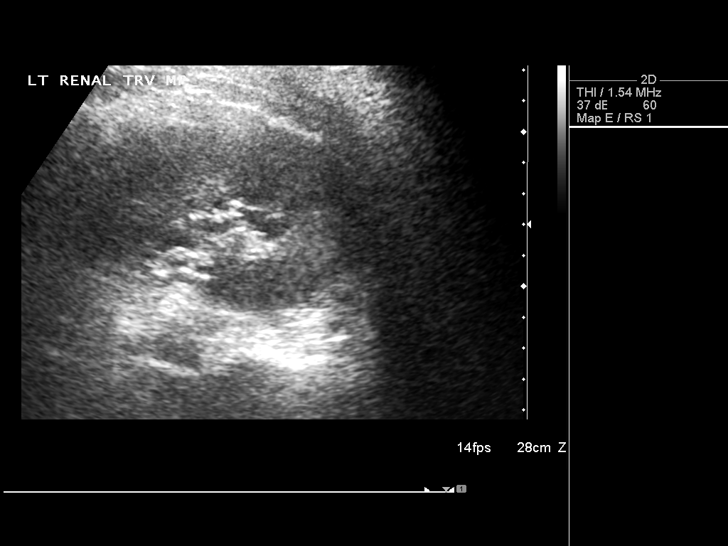
[im 86/86]
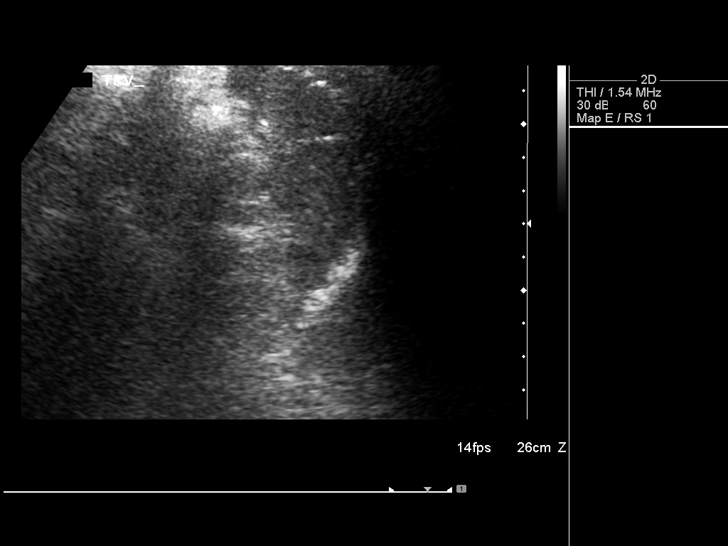

[14 of 25 positions shown; findings below may reference images not displayed]

FINDINGS: Gallbladder: No gallstones or wall thickening visualized. No
sonographic Murphy sign noted.

Common bile duct: Diameter: 3.5 mm.

Liver: No focal lesion identified. Within normal limits in
parenchymal echogenicity.

IVC: No abnormality visualized.

Pancreas: Visualized portion unremarkable.

Spleen: Size and appearance within normal limits.

Right Kidney: Length: 11.9 cm. Echogenicity within normal limits. No
mass or hydronephrosis visualized.

Left Kidney: Length: 11.8 cm. Echogenicity within normal limits. No
mass or hydronephrosis visualized.

Abdominal aorta: No aneurysm visualized.

Other findings: None.
IMPRESSION: Normal abdominal ultrasound.

## 2016-05-23 NOTE — Progress Notes (Signed)
Deanna ScaleZach Deanna Richards D.O. [facility] [address] [place], [place] [address] Phone: [phone](336) [phone] Subjective:       CC: Ankle pain  Deanna Richards:Subjective  Deanna QuillShenika M Richards is a 30 y.o. female coming in with complaint of Ankle pain. Patient past medical history is significant for posterior tibialis insufficiency as well as some mild Achilles tendinitis is. Seem to get better with over-the-counter orthotics, home exercises. Patient states unfortunate she is having worsening symptoms. Feels like it is actually starting to affect daily activities. Has not been working out secondary to pain. Also has not been wearing the orthotics on regular basis.   patient is also having increasing discomfort in her back. Has had this for quite some time. Was doing better when her ankle is doing better. Now seems to be worsening. Some intermittent pain going down the legs. Has not notice weakness but it does wake her up at night. Denies any fevers chills or any abnormal weight loss. Rates the severity of pain a 6 out of 10. Patient has been seen a chiropractor with very minimal benefit.  Past Medical History:  Diagnosis Date  . 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 PCOS (polycystic ovarian syndrome)    no current med.   Past Surgical History:  Procedure Laterality Date  . CARPAL TUNNEL RELEASE Right 11/05/2013   Procedure: CARPAL TUNNEL RELEASE ENDOSCOPIC RIGHT WRIST;  Surgeon: Sheral Apleyimothy D Murphy, MD;  Location: [facility];  Service: Orthopedics;  Laterality: Right;  . NO PAST SURGERIES     Social History   Social History  . Marital status: Single    Spouse name: N/A  . Number of children: N/A  . Years of education: N/A   Social History Main Topics  . Smoking status: Current Every Day Smoker    Years: 5.00    Types: Cigars  . Smokeless tobacco: Never Used     Comment: 1-2 Black and Mild/day  . Alcohol use 0.0 oz/week     Comment: weekends  . Drug use: No  . Sexual activity:  Yes    Birth control/ protection: None, Pill   Other Topics Concern  . None   Social History Narrative  . None   No Known Allergies Family History  Problem Relation Age of Onset  . Adopted: Yes  . Hypertension Mother     Past medical history, social, surgical and family history all reviewed in electronic medical record.  No pertanent information unless stated regarding to the chief complaint.   Review of Systems: No headache, visual changes, nausea, vomiting, diarrhea, constipation, dizziness, abdominal pain, skin rash, fevers, chills, night sweats, weight loss, swollen lymph nodes, body aches, joint swelling, muscle aches, chest pain, shortness of breath, mood changes.    Objective  Blood pressure (!) 144/92, height 5\' 2"  (1.575 m), weight 236 lb (107 kg), last menstrual period 04/19/2016. Systems examined below as of 05/24/16   Systems examined below as of 05/24/16 General: NAD A&O x3 mood, affect normal  HEENT: Pupils equal, extraocular movements intact no nystagmus Respiratory: not short of breath at rest or with speaking Cardiovascular: No lower extremity edema, non tender Skin: Warm dry intact with no signs of infection or rash on extremities or on axial skeleton. Abdomen: Soft nontender, no masses Neuro: Cranial nerves  intact, neurovascularly intact in all extremities with 2+ DTRs and 2+ pulses. Lymph: No lymphadenopathy appreciated today  Gait normal with good balance and coordination.  MSK: Non tender with full range  of motion and good stability and symmetric strength and tone of shoulders, elbows, wrist, knee hips bilaterally.   Ankle: Left No visible erythema or swelling. Range of motion is full in all directions. Mild weakness with inversion of the ankle Stable lateral and medial ligaments; squeeze test and kleiger test unremarkable; Talar dome nontender; No pain at base of 5th MT; No tenderness over cuboid; No tenderness over N spot or navicular prominence No  tenderness on posterior aspects of lateral and medial malleolus Severe pes planus bilaterally left couldn't and right Able to walk 4 steps.    Back Exam:  Inspection: Unremarkable  Motion: Flexion 25 deg, Extension 15 deg, Side Bending to 25 deg bilaterally,  Rotation to 25 deg bilaterally  SLR laying: Negative  XSLR laying: Negative  Palpable tenderness: Increasing tenderness in the paraspinal musculature of the lumbar spine bilaterally. FABER: negative. Sensory change: Gross sensation intact to all lumbar and sacral dermatomes.  Reflexes: 2+ at both patellar tendons, 2+ at achilles tendons, Babinski's downgoing.  Strength at foot  Plantar-flexion: 5/5 Dorsi-flexion: 5/5 Eversion: 5/5 Inversion: 5/5  Leg strength  Quad: 5/5 Hamstring: 5/5 Hip flexor: 5/5 Hip abductors: 5/5  Gait unremarkable.  Osteopathic findings Cervical C2 flexed rotated and side bent right C4 flexed rotated and side bent left C6 flexed rotated and side bent left T3 extended rotated and side bent right inhaled third rib T9 extended rotated and side bent left L2 flexed rotated and side bent right Sacrum right on right   Procedure: Real-time Ultrasound Guided Injection of left posterior tibialis tendon Device: GE Logiq E  Ultrasound guided injection is preferred based studies that show increased duration, increased effect, greater accuracy, decreased procedural pain, increased response rate, and decreased cost with ultrasound guided versus blind injection.  Verbal informed consent obtained.  Time-out conducted.  Noted no overlying erythema, induration, or other signs of local infection.  Skin prepped in a sterile fashion.  Local anesthesia: Topical Ethyl chloride.  With sterile technique and under real time ultrasound guidance:  The 25-gauge half-inch she was injected with a total of 0.5 mL of 0.5% Marcaine and 0.5 mL of Kenalog 40 mg/dL. Completed without difficulty  Pain immediately resolved suggesting  accurate placement of the medication.  Advised to call if fevers/chills, erythema, induration, drainage, or persistent bleeding.  Images permanently stored and available for review in the ultrasound unit.  Impression: Technically successful ultrasound guided injection.    Impression and Recommendations:     This case required medical decision making of moderate complexity.      Note: This dictation was prepared with Dragon dictation along with smaller phrase technology. Any transcriptional errors that result from this process are unintentional.

## 2016-05-24 ENCOUNTER — Encounter: Payer: Self-pay | Admitting: Family Medicine

## 2016-05-24 ENCOUNTER — Ambulatory Visit (HOSPITAL_COMMUNITY): Payer: 59

## 2016-05-24 ENCOUNTER — Ambulatory Visit (INDEPENDENT_AMBULATORY_CARE_PROVIDER_SITE_OTHER)
Admission: RE | Admit: 2016-05-24 | Discharge: 2016-05-24 | Disposition: A | Discharge status: Home / Self Care | Payer: 59 | Source: Ambulatory Visit | Attending: Family Medicine | Admitting: Family Medicine

## 2016-05-24 ENCOUNTER — Ambulatory Visit: Payer: Self-pay

## 2016-05-24 ENCOUNTER — Ambulatory Visit (INDEPENDENT_AMBULATORY_CARE_PROVIDER_SITE_OTHER): Payer: 59 | Admitting: Family Medicine

## 2016-05-24 VITALS — BP 144/92 | Ht 62.0 in | Wt 236.0 lb

## 2016-05-24 DIAGNOSIS — G8929 Other chronic pain: Secondary | ICD-10-CM | POA: Diagnosis not present

## 2016-05-24 DIAGNOSIS — M76829 Posterior tibial tendinitis, unspecified leg: Secondary | ICD-10-CM

## 2016-05-24 DIAGNOSIS — M999 Biomechanical lesion, unspecified: Secondary | ICD-10-CM | POA: Diagnosis not present

## 2016-05-24 DIAGNOSIS — M2140 Flat foot [pes planus] (acquired), unspecified foot: Secondary | ICD-10-CM | POA: Diagnosis not present

## 2016-05-24 DIAGNOSIS — M545 Low back pain, unspecified: Secondary | ICD-10-CM | POA: Insufficient documentation

## 2016-05-24 NOTE — Assessment & Plan Note (Signed)
Decision today to treat with OMT was based on Physical Exam  After verbal consent patient was treated with HVLA, ME techniques in cervical, thoracic, lumbar and sacral areas  Patient tolerated the procedure well with improvement in symptoms  Patient given exercises, stretches and lifestyle modifications  See medications in patient instructions if given  Patient will follow up in 4-8 weeks                                      

## 2016-05-24 NOTE — Assessment & Plan Note (Signed)
Patient given injection today and tolerated the procedure well. We discussed icing regimen and home exercise. Encourage her to wear the orthotics on a regular basis. We discussed also potential bracing. Patient has worsening symptoms we may need to consider formal physical therapy or custom orthotics. Patient come back and see me again in 4 weeks.

## 2016-05-24 NOTE — Patient Instructions (Addendum)
Good to see you  Ice 20 minutes 2 times daily. Usually after activity and before bed. Keep wearing the orthotics and good shoes.  pennsaid pinkie amount topically 2 times daily as needed.  Have a great new years See me again in 4-6 weeks We will get xray as well.

## 2016-05-24 NOTE — Assessment & Plan Note (Signed)
Patient is having chronic low back pain but states that it seems to be worsening. Patient states it seems to be getting severe enough to stop her from certain activities. Has been doing a lot of exercises. Patient sees a Landchiropractor as well as myself for osteopathic manipulation. Patient has done formal physical therapy. X-rays are pending. Patient starts having any radicular symptoms I do feel that advance imaging would be warranted with this being greater than a year in length.

## 2016-07-05 ENCOUNTER — Encounter: Payer: Self-pay | Admitting: Family Medicine

## 2016-07-05 ENCOUNTER — Ambulatory Visit (INDEPENDENT_AMBULATORY_CARE_PROVIDER_SITE_OTHER): Payer: 59 | Admitting: Family Medicine

## 2016-07-05 VITALS — BP 162/120 | HR 82 | Ht 62.0 in | Wt 236.0 lb

## 2016-07-05 DIAGNOSIS — M545 Low back pain, unspecified: Secondary | ICD-10-CM

## 2016-07-05 DIAGNOSIS — M76829 Posterior tibial tendinitis, unspecified leg: Secondary | ICD-10-CM

## 2016-07-05 DIAGNOSIS — Z1389 Encounter for screening for other disorder: Secondary | ICD-10-CM | POA: Diagnosis not present

## 2016-07-05 DIAGNOSIS — M999 Biomechanical lesion, unspecified: Secondary | ICD-10-CM

## 2016-07-05 DIAGNOSIS — D069 Carcinoma in situ of cervix, unspecified: Secondary | ICD-10-CM | POA: Diagnosis not present

## 2016-07-05 DIAGNOSIS — M2140 Flat foot [pes planus] (acquired), unspecified foot: Secondary | ICD-10-CM

## 2016-07-05 DIAGNOSIS — Z13220 Encounter for screening for lipoid disorders: Secondary | ICD-10-CM | POA: Diagnosis not present

## 2016-07-05 DIAGNOSIS — G8929 Other chronic pain: Secondary | ICD-10-CM | POA: Diagnosis not present

## 2016-07-05 DIAGNOSIS — Z01419 Encounter for gynecological examination (general) (routine) without abnormal findings: Secondary | ICD-10-CM | POA: Diagnosis not present

## 2016-07-05 DIAGNOSIS — Z131 Encounter for screening for diabetes mellitus: Secondary | ICD-10-CM | POA: Diagnosis not present

## 2016-07-05 NOTE — Assessment & Plan Note (Signed)
Encouraged patient continue to monitor. Follow-up again in 4 weeks. Seems better after injection.

## 2016-07-05 NOTE — Patient Instructions (Signed)
Good luck with the diet Ice is good.  Duexis up to 3 times a day for 3 days See me again in 3-4 weeks.

## 2016-07-05 NOTE — Assessment & Plan Note (Signed)
Decision today to treat with OMT was based on Physical Exam  After verbal consent patient was treated with HVLA, ME, FPR techniques in cervical, thoracic, lumbar and sacral areas  Patient tolerated the procedure well with improvement in symptoms  Patient given exercises, stretches and lifestyle modifications  See medications in patient instructions if given  Patient will follow up in 4 weeks 

## 2016-07-05 NOTE — Progress Notes (Signed)
Tawana ScaleZach Seleen Walter D.O. [facility] [address] [place], [place] [address] Phone: [phone](336) [phone] Subjective:       CC: Ankle pain, back pain follow up   YQM:[id number]:Subjective  Etta QuillShenika M Mainwaring is a 31 y.o. female coming in with complaint of Ankle pain. Patient past medical history is significant for posterior tibialis insufficiency as well as some mild Achilles tendinitis  Patient did have an injection. States it is feeling 95% better. Still some mild pain but nothing severe. Patient was also having back pain patient was seen previously we did start osteopathic manipulation. X-rays of patient's Bactroban unremarkable. Patient still has pain but no significant radiation of pain. Discusses it as a dull throbbing aching pain. Nothing new. Just worsening of previous symptoms.    Past Medical History:  Diagnosis Date  . 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 PCOS (polycystic ovarian syndrome)    no current med.   Past Surgical History:  Procedure Laterality Date  . CARPAL TUNNEL RELEASE Right 11/05/2013   Procedure: CARPAL TUNNEL RELEASE ENDOSCOPIC RIGHT WRIST;  Surgeon: Sheral Apleyimothy D Murphy, MD;  Location: [facility];  Service: Orthopedics;  Laterality: Right;  . NO PAST SURGERIES     Social History   Social History  . Marital status: Single    Spouse name: N/A  . Number of children: N/A  . Years of education: N/A   Social History Main Topics  . Smoking status: Current Every Day Smoker    Years: 5.00    Types: Cigars  . Smokeless tobacco: Never Used     Comment: 1-2 Black and Mild/day  . Alcohol use 0.0 oz/week     Comment: weekends  . Drug use: No  . Sexual activity: Yes    Birth control/ protection: None, Pill   Other Topics Concern  . None   Social History Narrative  . None   No Known Allergies Family History  Problem Relation Age of Onset  . Adopted: Yes  . Hypertension Mother     Past medical history, social, surgical and family history all  reviewed in electronic medical record.  No pertanent information unless stated regarding to the chief complaint.   Review of Systems: No headache, visual changes, nausea, vomiting, diarrhea, constipation, dizziness, abdominal pain, skin rash, fevers, chills, night sweats, weight loss, swollen lymph nodes, body aches, joint swelling,, chest pain, shortness of breath, mood changes.   Positive muscle aches  Objective  Blood pressure (!) 162/120, pulse 82, height 5\' 2"  (1.575 m), weight 236 lb (107 kg). Systems examined below as of 07/05/16   Systems examined below as of 07/05/16 General: NAD A&O x3 mood, affect normal  HEENT: Pupils equal, extraocular movements intact no nystagmus Respiratory: not short of breath at rest or with speaking Cardiovascular: No lower extremity edema, non tender Skin: Warm dry intact with no signs of infection or rash on extremities or on axial skeleton. Abdomen: Soft nontender, no masses Neuro: Cranial nerves  intact, neurovascularly intact in all extremities with 2+ DTRs and 2+ pulses. Lymph: No lymphadenopathy appreciated today  Gait normal with good balance and coordination.  MSK: Non tender with full range of motion and good stability and symmetric strength and tone of shoulders, elbows, wrist,  knee hips bilaterally.     Ankle: Left No visible erythema or swelling. Range of motion is full in all directions. Still very mild tenderness over the posterior tibialis area. Stable lateral and medial ligaments; squeeze test and kleiger test  unremarkable; Talar dome nontender; No pain at base of 5th MT; No tenderness over cuboid; No tenderness over N spot or navicular prominence No tenderness on posterior aspects of lateral and medial malleolus Severe pes planus bilaterally left couldn't and right Able to walk 4 steps.    Back Exam:  Inspection: Unremarkable  Motion: Flexion 25 deg, Extension 15 deg, Side Bending to 25 deg bilaterally,  Rotation to 25 deg  bilaterally  SLR laying: Negative  XSLR laying: Negative  Palpable tenderness: Still significant tenderness in the right side of the paraspinal musculature FABER: negative. Sensory change: Gross sensation intact to all lumbar and sacral dermatomes.  Reflexes: 2+ at both patellar tendons, 2+ at achilles tendons, Babinski's downgoing.  Strength at foot  Plantar-flexion: 5/5 Dorsi-flexion: 5/5 Eversion: 5/5 Inversion: 5/5  Leg strength  Quad: 5/5 Hamstring: 5/5 Hip flexor: 5/5 Hip abductors: 5/5  Gait unremarkable.  Osteopathic findings C7 flexed rotated and side bent left T3 extended rotated and side bent right inhaled third rib T7 extended rotated and side bent left L3 flexed rotated and side bent right Sacrum right on right    Impression and Recommendations:     This case required medical decision making of moderate complexity.      Note: This dictation was prepared with Dragon dictation along with smaller phrase technology. Any transcriptional errors that result from this process are unintentional.

## 2016-07-05 NOTE — Assessment & Plan Note (Signed)
Stable overall. Patient does have chronic pain. We discussed that advance imaging but I do not think is necessary at this time. No radiation of pain. Patient knows if any numbness or weakness occurs to seek medical attention medially. Patient with continue to be active. Follow-up again in 4-6 weeks.

## 2016-07-19 DIAGNOSIS — M5417 Radiculopathy, lumbosacral region: Secondary | ICD-10-CM | POA: Diagnosis not present

## 2016-07-19 DIAGNOSIS — M9905 Segmental and somatic dysfunction of pelvic region: Secondary | ICD-10-CM | POA: Diagnosis not present

## 2016-07-19 DIAGNOSIS — M9903 Segmental and somatic dysfunction of lumbar region: Secondary | ICD-10-CM | POA: Diagnosis not present

## 2016-08-01 NOTE — Progress Notes (Signed)
Deanna Richards D.O.  [facility] [address] [place], [place] [address] Phone: [phone]([phone] Subjective:       CC: Ankle pain, back pain follow up   [name]:[id number]:Subjective  Deanna Richards coming in with complaint of Ankle pain. Patient past medical history is significant for posterior tibialis insufficiency as well as some mild Achilles tendinitis  Patient did have an injection. Doing extremely well still. Minimal pain in the ankles at this time.  Patient continues to have some tightness of the lower back. Has responded fairly well to a supine manipulation. Patient states seems to be more on the right lower side at this time. States that it seems to be worse with extending her back. Patient has been trying to be more active and lose weight and thinks that this could be contributing.    Past Medical History:  Diagnosis Date  . 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 PCOS (polycystic ovarian syndrome)    no current med.   Past Surgical History:  Procedure Laterality Date  . CARPAL TUNNEL RELEASE Right 11/05/2013   Procedure: CARPAL TUNNEL RELEASE ENDOSCOPIC RIGHT WRIST;  Surgeon: Sheral Apleyimothy D Murphy, MD;  Location: [facility];  Service: Orthopedics;  Laterality: Right;  . NO PAST SURGERIES     Social History   Social History  . Marital status: Single    Spouse name: N/A  . Number of children: N/A  . Years of education: N/A   Social History Main Topics  . Smoking status: Current Every Day Smoker    Years: 5.00    Types: Cigars  . Smokeless tobacco: Never Used     Comment: 1-2 Black and Mild/day  . Alcohol use 0.0 oz/week     Comment: weekends  . Drug use: No  . Sexual activity: Yes    Birth control/ protection: None, Pill   Other Topics Concern  . None   Social History Narrative  . None   No Known Allergies Family History  Problem Relation Age of Onset  . Adopted: Yes  . Hypertension Mother     Past medical history,  social, surgical and family history all reviewed in electronic medical record.  No pertanent information unless stated regarding to the chief complaint.   Review of Systems: No headache, visual changes, nausea, vomiting, diarrhea, constipation, dizziness, abdominal pain, skin rash, fevers, chills, night sweats, weight loss, swollen lymph nodes, body aches, joint swelling, chest pain, shortness of breath, mood changes.   Positive for muscle aches  Objective  Blood pressure 122/84, pulse 82, height 5\' 2"  (1.575 m), SpO2 100 %.   Systems examined below as of 08/02/16 General: NAD A&O x3 mood, affect normal  HEENT: Pupils equal, extraocular movements intact no nystagmus Respiratory: not short of breath at rest or with speaking Cardiovascular: No lower extremity edema, non tender Skin: Warm dry intact with no signs of infection or rash on extremities or on axial skeleton. Abdomen: Soft nontender, no masses Neuro: Cranial nerves  intact, neurovascularly intact in all extremities with 2+ DTRs and 2+ pulses. Lymph: No lymphadenopathy appreciated today  Gait normal with good balance and coordination.  MSK: Non tender with full range of motion and good stability and symmetric strength and tone of shoulders, elbows, wrist,  knee hips and ankles bilaterally.       Severe pes planus bilaterally with overpronation of the hindfoot     Back Exam:  Inspection: Unremarkable  Motion: Flexion 25  deg, Extension 10 deg, Side Bending to 25 deg bilaterally,  Rotation to 25 deg bilaterally mild increasing tightness from previous exam SLR laying: Negative  XSLR laying: Negative  Palpable tenderness: Tender right sided tightness of the paraspinal musculature of the lumbar sacral area FABER: Positive right which is new. Sensory change: Gross sensation intact to all lumbar and sacral dermatomes.  Reflexes: 2+ at both patellar tendons, 2+ at achilles tendons, Babinski's downgoing.  Strength at foot    Plantar-flexion: 5/5 Dorsi-flexion: 5/5 Eversion: 5/5 Inversion: 5/5  Leg strength  Quad: 5/5 Hamstring: 5/5 Hip flexor: 5/5 Hip abductors: 5/5  Gait unremarkable.  Osteopathic findings Cervical C5 flexed rotated and side bent right T3 extended rotated and side bent right inhaled third rib T6 extended rotated and side bent left L3 flexed rotated and side bent right Sacrum left on left     Impression and Recommendations:     This case required medical decision making of moderate complexity.      Note: This dictation was prepared with Dragon dictation along with smaller phrase technology. Any transcriptional errors that result from this process are unintentional.

## 2016-08-02 ENCOUNTER — Ambulatory Visit (INDEPENDENT_AMBULATORY_CARE_PROVIDER_SITE_OTHER): Payer: 59 | Admitting: Family Medicine

## 2016-08-02 ENCOUNTER — Encounter: Payer: Self-pay | Admitting: Family Medicine

## 2016-08-02 ENCOUNTER — Encounter: Payer: Self-pay | Admitting: *Deleted

## 2016-08-02 ENCOUNTER — Ambulatory Visit: Payer: 59 | Admitting: Family Medicine

## 2016-08-02 VITALS — BP 122/84 | HR 82 | Ht 62.0 in

## 2016-08-02 DIAGNOSIS — M999 Biomechanical lesion, unspecified: Secondary | ICD-10-CM

## 2016-08-02 DIAGNOSIS — M545 Low back pain, unspecified: Secondary | ICD-10-CM

## 2016-08-02 DIAGNOSIS — G8929 Other chronic pain: Secondary | ICD-10-CM | POA: Diagnosis not present

## 2016-08-02 DIAGNOSIS — M2140 Flat foot [pes planus] (acquired), unspecified foot: Secondary | ICD-10-CM | POA: Diagnosis not present

## 2016-08-02 DIAGNOSIS — M76829 Posterior tibial tendinitis, unspecified leg: Secondary | ICD-10-CM

## 2016-08-02 NOTE — Patient Instructions (Signed)
God to see you as always.  Ice after [employer].  Have a great time.  During your cycle consider iron 65mg  daily with 500mg  of vitamin C can help with muscle strength and endurance Avoid back extension  See me again in 4-6 weeks.

## 2016-08-02 NOTE — Assessment & Plan Note (Signed)
Stable

## 2016-08-02 NOTE — Assessment & Plan Note (Signed)
Still believe that this and multifactorial. Patient's pes planus, obesity, and poor core strength is likely TEARING. Still seems to respond fairly well to osteopathic manipulation. Some mild increasing tightness and seemed to have more discomfort over the sacroiliac ligament area as well as the iliolumbar ligaments. Seems to be worse with extension at this time. No radicular symptoms. Encourage patient to do the exercises on a regular basis. Patient will try to increase her activities and avoid others. Follow-up again in 4 weeks

## 2016-08-02 NOTE — Assessment & Plan Note (Signed)
Decision today to treat with OMT was based on Physical Exam  After verbal consent patient was treated with HVLA, ME, FPR techniques in cervical, thoracic, lumbar and sacral areas  Patient tolerated the procedure well with improvement in symptoms  Patient given exercises, stretches and lifestyle modifications  See medications in patient instructions if given  Patient will follow up in 4 weeks 

## 2016-08-19 ENCOUNTER — Other Ambulatory Visit: Payer: Self-pay | Admitting: Sports Medicine

## 2016-08-19 ENCOUNTER — Ambulatory Visit (INDEPENDENT_AMBULATORY_CARE_PROVIDER_SITE_OTHER): Payer: 59

## 2016-08-19 ENCOUNTER — Ambulatory Visit (INDEPENDENT_AMBULATORY_CARE_PROVIDER_SITE_OTHER): Payer: 59 | Admitting: Sports Medicine

## 2016-08-19 ENCOUNTER — Encounter: Payer: Self-pay | Admitting: Sports Medicine

## 2016-08-19 DIAGNOSIS — M79674 Pain in right toe(s): Secondary | ICD-10-CM

## 2016-08-19 DIAGNOSIS — M79675 Pain in left toe(s): Secondary | ICD-10-CM

## 2016-08-19 LAB — BASIC METABOLIC PANEL
BUN: 13 mg/dL (ref 6–23)
CHLORIDE: 104 meq/L (ref 96–112)
CO2: 26 mEq/L (ref 19–32)
CREATININE: 0.78 mg/dL (ref 0.40–1.20)
Calcium: 9.5 mg/dL (ref 8.4–10.5)
GFR: 111.55 mL/min (ref >60.00)
Glucose, Bld: 95 mg/dL (ref 70–99)
Potassium: 4 mEq/L (ref 3.5–5.1)
Sodium: 138 mEq/L (ref 135–145)

## 2016-08-19 LAB — URIC ACID: URIC ACID, SERUM: 4.3 mg/dL (ref 2.4–7.0)

## 2016-08-19 NOTE — Progress Notes (Signed)
OFFICE VISIT NOTE Deanna Richards  [facility] [place] [phone]  Deanna Richards - 31 y.o. female MRN [id number]  Date of birth: 07/22/1985  Visit Date: 08/19/2016  PCP: Myrlene BrokerElizabeth A Crawford, MD   Referred by: Myrlene Brokerrawford, Elizabeth A, *  SUBJECTIVE:   Chief Complaint  Patient presents with  . Pain in RT great toe    Sx started x2 days ago w/o any reported injury. Walking triggers the pain and makes it worse. No medication use for sx. No radiation of pain.   HPI: Patient reports acute onset of right great toe pain that began 2 days ago.  No known injury.  She had marked pain at nighttime secondary to the bed sheets rubbing over her toe.  She had a small amount of swelling and redness yesterday but this is subsequently resolved.  She tried taking ibuprofen with moderate benefit.  ROS: Otherwise per HPI.  HISTORY & PERTINENT PRIOR DATA:  No specialty comments available. She reports that she has been smoking Cigars.  She has smoked for the past 5.00 years. She has never used smokeless tobacco.   Recent Labs  09/05/15 1406 08/19/16 1156  HGBA1C 5.6  --   LABURIC  --  4.3   Medications & Allergies reviewed per EMR Patient Active Problem List   Diagnosis Date Noted  . Low back pain 05/24/2016  . Left otitis media 01/31/2016  . Allergic rhinitis 01/31/2016  . Nonallopathic lesion of cervical region 01/05/2016  . Routine general medical examination at a health care facility 09/05/2015  . Nonallopathic lesion of thoracic region 11/21/2014  . Nonallopathic lesion of lumbosacral region 11/21/2014  . 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 INSOMNIA 05/29/2010  . Morbid obesity (HCC) 02/22/2010  . SMOKER 02/22/2010  . Essential hypertension 02/22/2010   Past Medical History:  Diagnosis Date  . 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 PCOS (polycystic ovarian syndrome)    no current med.   Family History    Problem Relation Age of Onset  . Adopted: Yes  . Hypertension Mother    Past Surgical History:  Procedure Laterality Date  . CARPAL TUNNEL RELEASE Right 11/05/2013   Procedure: CARPAL TUNNEL RELEASE ENDOSCOPIC RIGHT WRIST;  Surgeon: Sheral Apleyimothy D Murphy, MD;  Location: [facility];  Service: Orthopedics;  Laterality: Right;  . NO PAST SURGERIES     Social History   Occupational History  . Not on file.   Social History Main Topics  . Smoking status: Current Every Day Smoker    Years: 5.00    Types: Cigars  . Smokeless tobacco: Never Used     Comment: 1-2 Black and Mild/day  . Alcohol use 0.0 oz/week     Comment: weekends  . Drug use: No  . Sexual activity: Yes    Birth control/ protection: None, Pill    OBJECTIVE:  VS:  HT:    WT:   BMI:     BP:   HR: bpm  TEMP: ( )  RESP:  PHYSICAL EXAM: General:   WDWN, NAD, Non-toxic appearing Psych:  Alert & appropriately interactive  Not depressed or anxious appearing Lower Extremities:  No significant rashes/lesions/ulcerations overlying the legs.  No significant pretibial edema.  No clubbing or cyanosis.  DP & PT pulses 2+/4.  Sensation intact to light touch. Great toe, right:  Overall good alignment.  Small amount of erythema over the anterior medial IP joint.  She  has no pain with active or passive range of motion of the MTP or IP joint.  Early bunion formation with slight splay toe between first and second toes.  Otherwise well aligned.  Slight equinus contracture on the right greater than left with this is minimal.      IMAGING & PROCEDURES: Dg Toe Great Right  Result Date: 08/19/2016 CLINICAL DATA:  Right great toe pain without injury. EXAM: RIGHT GREAT TOE COMPARISON:  None. FINDINGS: No acute osseous abnormality.  No degenerative changes. IMPRESSION: Negative. Electronically Signed   By: Leanna Battles M.D.   On: 08/19/2016 12:12     ASSESSMENT & PLAN:  Visit Diagnoses:  1. Great toe  pain, right    Meds: No orders of the defined types were placed in this encounter.   Orders:  Orders Placed This Encounter  Procedures  . Uric acid  . Basic metabolic panel    Follow-up: Return if symptoms worsen or fail to improve.   eval for gout Samples of [place] provided.  Will call if needs refills Will send results through mychart/letter if normal. Otherwise will call Symptoms are concerning for potential gout flare although she has had marked improvement very rapidly.  We will go ahead and check basic labs.  If any recurrence of pain consider injection versus topical medications.

## 2016-08-20 ENCOUNTER — Encounter: Payer: Self-pay | Admitting: Sports Medicine

## 2016-08-20 NOTE — Progress Notes (Signed)
Please mail letter to pt.

## 2016-08-20 NOTE — Progress Notes (Signed)
Placed in office mailbox to be sent to patient.

## 2016-09-16 DIAGNOSIS — M9905 Segmental and somatic dysfunction of pelvic region: Secondary | ICD-10-CM | POA: Diagnosis not present

## 2016-09-16 DIAGNOSIS — M5417 Radiculopathy, lumbosacral region: Secondary | ICD-10-CM | POA: Diagnosis not present

## 2016-09-16 DIAGNOSIS — M9903 Segmental and somatic dysfunction of lumbar region: Secondary | ICD-10-CM | POA: Diagnosis not present

## 2016-09-18 DIAGNOSIS — M25532 Pain in left wrist: Secondary | ICD-10-CM | POA: Diagnosis not present

## 2016-09-23 DIAGNOSIS — M5417 Radiculopathy, lumbosacral region: Secondary | ICD-10-CM | POA: Diagnosis not present

## 2016-09-23 DIAGNOSIS — M9903 Segmental and somatic dysfunction of lumbar region: Secondary | ICD-10-CM | POA: Diagnosis not present

## 2016-09-23 DIAGNOSIS — M9905 Segmental and somatic dysfunction of pelvic region: Secondary | ICD-10-CM | POA: Diagnosis not present

## 2016-09-30 DIAGNOSIS — M9905 Segmental and somatic dysfunction of pelvic region: Secondary | ICD-10-CM | POA: Diagnosis not present

## 2016-09-30 DIAGNOSIS — M5417 Radiculopathy, lumbosacral region: Secondary | ICD-10-CM | POA: Diagnosis not present

## 2016-09-30 DIAGNOSIS — M9903 Segmental and somatic dysfunction of lumbar region: Secondary | ICD-10-CM | POA: Diagnosis not present

## 2016-10-18 DIAGNOSIS — M5417 Radiculopathy, lumbosacral region: Secondary | ICD-10-CM | POA: Diagnosis not present

## 2016-10-18 DIAGNOSIS — M9905 Segmental and somatic dysfunction of pelvic region: Secondary | ICD-10-CM | POA: Diagnosis not present

## 2016-10-18 DIAGNOSIS — M9903 Segmental and somatic dysfunction of lumbar region: Secondary | ICD-10-CM | POA: Diagnosis not present

## 2016-10-23 DIAGNOSIS — M9903 Segmental and somatic dysfunction of lumbar region: Secondary | ICD-10-CM | POA: Diagnosis not present

## 2016-10-23 DIAGNOSIS — M9905 Segmental and somatic dysfunction of pelvic region: Secondary | ICD-10-CM | POA: Diagnosis not present

## 2016-10-23 DIAGNOSIS — M5417 Radiculopathy, lumbosacral region: Secondary | ICD-10-CM | POA: Diagnosis not present

## 2016-11-13 ENCOUNTER — Encounter: Payer: Self-pay | Admitting: Family Medicine

## 2016-11-13 ENCOUNTER — Ambulatory Visit (INDEPENDENT_AMBULATORY_CARE_PROVIDER_SITE_OTHER): Payer: 59 | Admitting: Family Medicine

## 2016-11-13 VITALS — BP 174/124 | HR 85 | Temp 97.7°F | Wt 251.0 lb

## 2016-11-13 DIAGNOSIS — H00011 Hordeolum externum right upper eyelid: Secondary | ICD-10-CM

## 2016-11-13 DIAGNOSIS — I10 Essential (primary) hypertension: Secondary | ICD-10-CM

## 2016-11-13 NOTE — Progress Notes (Signed)
Subjective:    Patient ID: Deanna Richards, female    DOB: 09/15/1985, 31 y.o.   MRN: [id number]  HPI Has bump on right eyelid for 1 month. Has noticed it has gotten bigger over last week. Has been using warm compresses and tea bags without relief.   Feels poorly today, frontal headache, weak feeling, some lightheadedness. Has log of blood pressures, running 120-170/80-110. Has had intermittent elevations and family history. Is working to lose weight and watch salt intake, does not want to take medication.   Past Medical History:  Diagnosis Date  . 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 PCOS (polycystic ovarian syndrome)    no current med.   Past Surgical History:  Procedure Laterality Date  . CARPAL TUNNEL RELEASE Right 11/05/2013   Procedure: CARPAL TUNNEL RELEASE ENDOSCOPIC RIGHT WRIST;  Surgeon: Sheral Apleyimothy D Murphy, MD;  Location: [facility];  Service: Orthopedics;  Laterality: Right;  . NO PAST SURGERIES     Family History  Problem Relation Age of Onset  . Adopted: Yes  . Hypertension Mother    Social History  Substance Use Topics  . Smoking status: Current Every Day Smoker    Years: 5.00    Types: Cigars  . Smokeless tobacco: Never Used     Comment: 1-2 Black and Mild/day  . Alcohol use 0.0 oz/week     Comment: weekends      Review of Systems Per HPI    Objective:   Physical Exam  Constitutional: She is oriented to person, place, and time. She appears well-developed and well-nourished. No distress.  HENT:  Head: Normocephalic and atraumatic.  Eyes: Conjunctivae are normal. Right eye exhibits hordeolum.    Hordeolum with white point. Not fluctuant, no surrounding erythema.   Cardiovascular: Normal rate.   Pulmonary/Chest: Effort normal.  Neurological: She is alert and oriented to person, place, and time.  Skin: Skin is warm and dry. She is not diaphoretic.  Psychiatric: She has a normal mood and affect. Her behavior is normal.  Judgment and thought content normal.  Vitals reviewed.     BP (!) 174/124 (BP Location: Left Arm, Patient Position: Sitting, Cuff Size: Large)   Pulse 85   Temp 97.7 F (36.5 C) (Oral)   Wt 251 lb (113.9 kg)   LMP 07/06/2016 Comment: PCOS   SpO2 97%   BMI 45.91 kg/m  BP Readings from Last 3 Encounters:  11/13/16 (!) 174/124  08/02/16 122/84  07/05/16 (!) 162/120   Wt Readings from Last 3 Encounters:  11/13/16 251 lb (113.9 kg)  07/05/16 236 lb (107 kg)  05/24/16 236 lb (107 kg)    Recheck blood pressure 160/104    Assessment & Plan:  1. Hordeolum externum of right upper eyelid - Provided written and verbal information regarding diagnosis and treatment. - warm compresses 3-4 times a day, if no improvement, she will see her eye care provider.   2. Essential hypertension - discussed concern about her blood pressure readings today as well as her blood pressure log with elevated readings. Discussed possible stroke or damage to heart/kidneys. She states she will not take medication at this time, that she wishes to work on her diet and weight loss.  - Provided written and verbal information regarding diagnosis and treatment. - encouraged her to follow up in 1 month and bring readings    Olean Reeeborah Ayo Guarino, FNP-BC  [facility] [place], [facility]  11/14/2016 4:00 PM

## 2016-11-13 NOTE — Patient Instructions (Signed)
Continue warm compresses several times a day to see if will drain, if not better in a couple of days, please see opthalmologist   Stye A stye is a bump on your eyelid caused by a bacterial infection. A stye can form inside the eyelid (internal stye) or outside the eyelid (external stye). An internal stye may be caused by an infected oil-producing gland inside your eyelid. An external stye may be caused by an infection at the base of your eyelash (hair follicle). Styes are very common. Anyone can get them at any age. They usually occur in just one eye, but you may have more than one in either eye. What are the causes? The infection is almost always caused by bacteria called Staphylococcus aureus. This is a common type of bacteria that lives on your skin. What increases the risk? You may be at higher risk for a stye if you have had one before. You may also be at higher risk if you have:  Diabetes.  Long-term illness.  Long-term eye redness.  A skin condition called seborrhea.  High fat levels in your blood (lipids).  What are the signs or symptoms? Eyelid pain is the most common symptom of a stye. Internal styes are more painful than external styes. Other signs and symptoms may include:  Painful swelling of your eyelid.  A scratchy feeling in your eye.  Tearing and redness of your eye.  Pus draining from the stye.  How is this diagnosed? Your health care provider may be able to diagnose a stye just by examining your eye. The health care provider may also check to make sure:  You do not have a fever or other signs of a more serious infection.  The infection has not spread to other parts of your eye or areas around your eye.  How is this treated? Most styes will clear up in a few days without treatment. In some cases, you may need to use antibiotic drops or ointment to prevent infection. Your health care provider may have to drain the stye surgically if your stye  is:  Large.  Causing a lot of pain.  Interfering with your vision.  This can be done using a thin blade or a needle. Follow these instructions at home:  Take medicines only as directed by your health care provider.  Apply a clean, warm compress to your eye for 10 minutes, 4 times a day.  Do not wear contact lenses or eye makeup until your stye has healed.  Do not try to pop or drain the stye. Contact a health care provider if:  You have chills or a fever.  Your stye does not go away after several days.  Your stye affects your vision.  Your eyeball becomes swollen, red, or painful. This information is not intended to replace advice given to you by your health care provider. Make sure you discuss any questions you have with your health care provider. Document Released: 02/20/2005 Document Revised: 01/07/2016 Document Reviewed: 08/27/2013 Elsevier Interactive Patient Education  2018 [employer].  Hypertension Hypertension, commonly called high blood pressure, is when the force of blood pumping through the arteries is too strong. The arteries are the blood vessels that carry blood from the heart throughout the body. Hypertension forces the heart to work harder to pump blood and may cause arteries to become narrow or stiff. Having untreated or uncontrolled hypertension can cause heart attacks, strokes, kidney disease, and other problems. A blood pressure reading consists of a  higher number over a lower number. Ideally, your blood pressure should be below 120/80. The first ("top") number is called the systolic pressure. It is a measure of the pressure in your arteries as your heart beats. The second ("bottom") number is called the diastolic pressure. It is a measure of the pressure in your arteries as the heart relaxes. What are the causes? The cause of this condition is not known. What increases the risk? Some risk factors for high blood pressure are under your control. Others are  not. Factors you can change  Smoking.  Having type 2 diabetes mellitus, high cholesterol, or both.  Not getting enough exercise or physical activity.  Being overweight.  Having too much fat, sugar, calories, or salt (sodium) in your diet.  Drinking too much alcohol. Factors that are difficult or impossible to change  Having chronic kidney disease.  Having a family history of high blood pressure.  Age. Risk increases with age.  Race. You may be at higher risk if you are African-American.  Gender. Men are at higher risk than women before age 30. After age 30, women are at higher risk than men.  Having obstructive sleep apnea.  Stress. What are the signs or symptoms? Extremely high blood pressure (hypertensive crisis) may cause:  Headache.  Anxiety.  Shortness of breath.  Nosebleed.  Nausea and vomiting.  Severe chest pain.  Jerky movements you cannot control (seizures).  How is this diagnosed? This condition is diagnosed by measuring your blood pressure while you are seated, with your arm resting on a surface. The cuff of the blood pressure monitor will be placed directly against the skin of your upper arm at the level of your heart. It should be measured at least twice using the same arm. Certain conditions can cause a difference in blood pressure between your right and left arms. Certain factors can cause blood pressure readings to be lower or higher than normal (elevated) for a short period of time:  When your blood pressure is higher when you are in a health care provider's office than when you are at home, this is called white coat hypertension. Most people with this condition do not need medicines.  When your blood pressure is higher at home than when you are in a health care provider's office, this is called masked hypertension. Most people with this condition may need medicines to control blood pressure.  If you have a high blood pressure reading during one  visit or you have normal blood pressure with other risk factors:  You may be asked to return on a different day to have your blood pressure checked again.  You may be asked to monitor your blood pressure at home for 1 week or longer.  If you are diagnosed with hypertension, you may have other blood or imaging tests to help your health care provider understand your overall risk for other conditions. How is this treated? This condition is treated by making healthy lifestyle changes, such as eating healthy foods, exercising more, and reducing your alcohol intake. Your health care provider may prescribe medicine if lifestyle changes are not enough to get your blood pressure under control, and if:  Your systolic blood pressure is above 130.  Your diastolic blood pressure is above 80.  Your personal target blood pressure may vary depending on your medical conditions, your age, and other factors. Follow these instructions at home: Eating and drinking  Eat a diet that is high in fiber and potassium, and  low in sodium, added sugar, and fat. An example eating plan is called the DASH (Dietary Approaches to Stop Hypertension) diet. To eat this way: ? Eat plenty of fresh fruits and vegetables. Try to fill half of your plate at each meal with fruits and vegetables. ? Eat whole grains, such as whole wheat pasta, brown rice, or whole grain bread. Fill about one quarter of your plate with whole grains. ? Eat or drink low-fat dairy products, such as skim milk or low-fat yogurt. ? Avoid fatty cuts of meat, processed or cured meats, and poultry with skin. Fill about one quarter of your plate with lean proteins, such as fish, chicken without skin, beans, eggs, and tofu. ? Avoid premade and processed foods. These tend to be higher in sodium, added sugar, and fat.  Reduce your daily sodium intake. Most people with hypertension should eat less than 1,500 mg of sodium a day.  Limit alcohol intake to no more than 1  drink a day for nonpregnant women and 2 drinks a day for men. One drink equals 12 oz of beer, 5 oz of wine, or 1 oz of hard liquor. Lifestyle  Work with your health care provider to maintain a healthy body weight or to lose weight. Ask what an ideal weight is for you.  Get at least 30 minutes of exercise that causes your heart to beat faster (aerobic exercise) most days of the week. Activities may include walking, swimming, or biking.  Include exercise to strengthen your muscles (resistance exercise), such as pilates or lifting weights, as part of your weekly exercise routine. Try to do these types of exercises for 30 minutes at least 3 days a week.  Do not use any products that contain nicotine or tobacco, such as cigarettes and e-cigarettes. If you need help quitting, ask your health care provider.  Monitor your blood pressure at home as told by your health care provider.  Keep all follow-up visits as told by your health care provider. This is important. Medicines  Take over-the-counter and prescription medicines only as told by your health care provider. Follow directions carefully. Blood pressure medicines must be taken as prescribed.  Do not skip doses of blood pressure medicine. Doing this puts you at risk for problems and can make the medicine less effective.  Ask your health care provider about side effects or reactions to medicines that you should watch for. Contact a health care provider if:  You think you are having a reaction to a medicine you are taking.  You have headaches that keep coming back (recurring).  You feel dizzy.  You have swelling in your ankles.  You have trouble with your vision. Get help right away if:  You develop a severe headache or confusion.  You have unusual weakness or numbness.  You feel faint.  You have severe pain in your chest or abdomen.  You vomit repeatedly.  You have trouble breathing. Summary  Hypertension is when the force  of blood pumping through your arteries is too strong. If this condition is not controlled, it may put you at risk for serious complications.  Your personal target blood pressure may vary depending on your medical conditions, your age, and other factors. For most people, a normal blood pressure is less than 120/80.  Hypertension is treated with lifestyle changes, medicines, or a combination of both. Lifestyle changes include weight loss, eating a healthy, low-sodium diet, exercising more, and limiting alcohol. This information is not intended to replace advice  given to you by your health care provider. Make sure you discuss any questions you have with your health care provider. Document Released: 05/13/2005 Document Revised: 04/10/2016 Document Reviewed: 04/10/2016 Elsevier Interactive Patient Education  [employer].

## 2016-12-25 DIAGNOSIS — M5417 Radiculopathy, lumbosacral region: Secondary | ICD-10-CM | POA: Diagnosis not present

## 2016-12-25 DIAGNOSIS — M9903 Segmental and somatic dysfunction of lumbar region: Secondary | ICD-10-CM | POA: Diagnosis not present

## 2016-12-25 DIAGNOSIS — M9905 Segmental and somatic dysfunction of pelvic region: Secondary | ICD-10-CM | POA: Diagnosis not present

## 2017-02-02 NOTE — Progress Notes (Signed)
Tawana ScaleZach Smith D.O. [facility] [address] [place], [place] [address] Phone: [phone] Subjective:       CC: Ankle pain, back pain follow up   [name]:NWGNFAOZHYHPI:Subjective  Etta QuillShenika M Richards is a 31 y.o. female coming in with complaint of Ankle pain since last week Thursday. She was limping last Thursday and Friday. Most of her pain is on the lateral malleolus into her achilles. Patient past medical history is significant for posterior tibialis insufficiency as well as some mild Achilles tendinitis  Patient did have an injection. Patient isn't doing relatively well overall. Did have an exacerbation last week but seems to have resolved at this time. Patient continues to have some tightness of the lower back. Has responded fairly well to osteopathic manipulation. Patient states that when she does he exercises she seems to feel better. Having some mild tightness overall.    Past Medical History:  Diagnosis Date  . 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 PCOS (polycystic ovarian syndrome)    no current med.   Past Surgical History:  Procedure Laterality Date  . CARPAL TUNNEL RELEASE Right 11/05/2013   Procedure: CARPAL TUNNEL RELEASE ENDOSCOPIC RIGHT WRIST;  Surgeon: Sheral Apleyimothy D Murphy, MD;  Location: [facility];  Service: Orthopedics;  Laterality: Right;  . NO PAST SURGERIES     Social History   Social History  . Marital status: Single    Spouse name: N/A  . Number of children: N/A  . Years of education: N/A   Social History Main Topics  . Smoking status: Current Every Day Smoker    Years: 5.00    Types: Cigars  . Smokeless tobacco: Never Used     Comment: 1-2 Black and Mild/day  . Alcohol use 0.0 oz/week     Comment: weekends  . Drug use: No  . Sexual activity: Yes    Birth control/ protection: None, Pill   Other Topics Concern  . None   Social History Narrative  . None   No Known Allergies Family History  Problem Relation Age of Onset  .  Adopted: Yes  . Hypertension Mother     Past medical history, social, surgical and family history all reviewed in electronic medical record.  No pertanent information unless stated regarding to the chief complaint.   Review of Systems: No headache, visual changes, nausea, vomiting, diarrhea, constipation, dizziness, abdominal pain, skin rash, fevers, chills, night sweats, weight loss, swollen lymph nodes, body aches, joint swelling, muscle aches, chest pain, shortness of breath, mood changes.    Objective  Blood pressure 138/90, pulse (!) 104, height 5\' 2"  (1.575 m), weight 258 lb (117 kg), SpO2 98 %.   Systems examined below as of 02/03/17 General: NAD A&O x3 mood, affect normal  HEENT: Pupils equal, extraocular movements intact no nystagmus Respiratory: not short of breath at rest or with speaking Cardiovascular: No lower extremity edema, non tender Skin: Warm dry intact with no signs of infection or rash on extremities or on axial skeleton. Abdomen: Soft nontender, no masses Neuro: Cranial nerves  intact, neurovascularly intact in all extremities with 2+ DTRs and 2+ pulses. Lymph: No lymphadenopathy appreciated today  Gait normal with good balance and coordination.  MSK: Non tender with full range of motion and good stability and symmetric strength and tone of shoulders, elbows, wrist,  knee hips and ankles bilaterally.  pes planus bilaterally    Back Exam:  Inspection: Unremarkable  Motion: Flexion 45 deg, Extension 15 deg,  Side Bending to 45 deg bilaterally,  Rotation to 45 deg bilaterally mild increase in range of motion from previous exam SLR laying: Negative  XSLR laying: Negative  Palpable tenderness: Still tenderness in the paraspinal musculature lumbar spine bilaterally. FABER: negative. Sensory change: Gross sensation intact to all lumbar and sacral dermatomes.  Reflexes: 2+ at both patellar tendons, 2+ at achilles tendons, Babinski's downgoing.  Strength at foot    Plantar-flexion: 5/5 Dorsi-flexion: 5/5 Eversion: 5/5 Inversion: 5/5  Leg strength  Quad: 5/5 Hamstring: 5/5 Hip flexor: 5/5 Hip abductors: 5/5  Gait unremarkable.  Osteopathic findings C4 flexed rotated and side bent left C6 flexed rotated and side bent left T3 extended rotated and side bent right inhaled third rib L3 flexed rotated and side bent right Sacrum right on right      Impression and Recommendations:     This case required medical decision making of moderate complexity.      Note: This dictation was prepared with Dragon dictation along with smaller phrase technology. Any transcriptional errors that result from this process are unintentional.

## 2017-02-03 ENCOUNTER — Encounter: Payer: Self-pay | Admitting: Family Medicine

## 2017-02-03 ENCOUNTER — Ambulatory Visit: Payer: Self-pay

## 2017-02-03 ENCOUNTER — Ambulatory Visit (INDEPENDENT_AMBULATORY_CARE_PROVIDER_SITE_OTHER): Payer: 59 | Admitting: Family Medicine

## 2017-02-03 VITALS — BP 138/90 | HR 104 | Ht 62.0 in | Wt 258.0 lb

## 2017-02-03 DIAGNOSIS — M545 Low back pain, unspecified: Secondary | ICD-10-CM

## 2017-02-03 DIAGNOSIS — G8929 Other chronic pain: Secondary | ICD-10-CM

## 2017-02-03 DIAGNOSIS — M999 Biomechanical lesion, unspecified: Secondary | ICD-10-CM | POA: Diagnosis not present

## 2017-02-03 DIAGNOSIS — M25571 Pain in right ankle and joints of right foot: Secondary | ICD-10-CM

## 2017-02-03 MED ORDER — VITAMIN D (ERGOCALCIFEROL) 1.25 MG (50000 UNIT) PO CAPS: 50000.0000 [IU] | ORAL_CAPSULE | ORAL | 0 refills | Status: DC

## 2017-02-03 MED ORDER — DICLOFENAC SODIUM 2 % TD SOLN
2.0000 | Freq: Two times a day (BID) | TRANSDERMAL | 3 refills | Status: DC
Start: 2017-02-03 — End: 2017-03-17

## 2017-02-03 NOTE — Assessment & Plan Note (Signed)
Decision today to treat with OMT was based on Physical Exam  After verbal consent patient was treated with HVLA, ME, FPR techniques in cervical, thoracic, lumbar and sacral areas  Patient tolerated the procedure well with improvement in symptoms  Patient given exercises, stretches and lifestyle modifications  See medications in patient instructions if given  Patient will follow up in 8-12 weeks 

## 2017-02-03 NOTE — Assessment & Plan Note (Signed)
Stable overall. Discussed with patient at great length about icing regimen, home exercises, which activities to do a which was to avoid. Encourage patient to continue to work on the core strengthening and weight loss. Follow-up again in 2-3 months.

## 2017-02-03 NOTE — Patient Instructions (Addendum)
Good to see you  Ice 20 minutes 2 times daily. Usually after activity and before bed. pennsaid pinkie amount topically 2 times daily as needed.   Once weekly vitamin D Look on [employer] for HOKA [place] 5 or xelero shoes would be great  We got you a note for workoout.  See me when you need me

## 2017-02-24 ENCOUNTER — Telehealth: Payer: Self-pay | Admitting: Emergency Medicine

## 2017-02-24 ENCOUNTER — Encounter: Payer: Self-pay | Admitting: *Deleted

## 2017-02-24 NOTE — Telephone Encounter (Signed)
Patients called and is asking if she can get a letter stating what her medical condition is that wants the need for her to wear sneakers to work. Please give her a call back thanks.

## 2017-02-24 NOTE — Telephone Encounter (Signed)
Spoke with pt, letter printed & she will pick it up tomorrow.

## 2017-03-17 ENCOUNTER — Ambulatory Visit (INDEPENDENT_AMBULATORY_CARE_PROVIDER_SITE_OTHER): Payer: 59 | Admitting: Internal Medicine

## 2017-03-17 ENCOUNTER — Encounter: Payer: Self-pay | Admitting: Internal Medicine

## 2017-03-17 VITALS — BP 158/100 | HR 103 | Temp 99.1°F | Ht 62.0 in | Wt 261.0 lb

## 2017-03-17 DIAGNOSIS — F17200 Nicotine dependence, unspecified, uncomplicated: Secondary | ICD-10-CM

## 2017-03-17 DIAGNOSIS — I10 Essential (primary) hypertension: Secondary | ICD-10-CM | POA: Diagnosis not present

## 2017-03-17 MED ORDER — AMLODIPINE BESYLATE 10 MG PO TABS: 10.0000 mg | ORAL_TABLET | Freq: Every day | ORAL | 3 refills | Status: DC

## 2017-03-17 NOTE — Progress Notes (Signed)
   Subjective:    Patient ID: Deanna Richards, female    DOB: 08/22/1985, 31 y.o.   MRN: [id number]  HPI The patient is a 31 YO female coming in for pre-op clearance due to high blood pressure. She is a smoker and has not been taking care of herself lately. She is eating a lot of things she knows she should not. She denies chest pains or SOB or headaches. Several BP at the clinic in the last 6 months with readings of 160/100 or above. She previously has had more normal readings. She does not really want to take medication if she can help naturally. She is supposed to get her wisdom teeth taken out.   [facility] reviewed and updated.   Review of Systems  Constitutional: Positive for activity change, appetite change and unexpected weight change.  HENT: Positive for dental problem.   Eyes: Negative.   Respiratory: Negative for cough, chest tightness and shortness of breath.   Cardiovascular: Negative for chest pain, palpitations and leg swelling.  Gastrointestinal: Negative for abdominal distention, abdominal pain, constipation, diarrhea, nausea and vomiting.  Musculoskeletal: Negative.   Skin: Negative.   Neurological: Negative.   Psychiatric/Behavioral: Negative.       Objective:   Physical Exam  Constitutional: She is oriented to person, place, and time. She appears well-developed and well-nourished.  Overweight  HENT:  Head: Normocephalic and atraumatic.  Eyes: EOM are normal.  Neck: Normal range of motion.  Cardiovascular: Normal rate and regular rhythm.   Pulmonary/Chest: Effort normal and breath sounds normal. No respiratory distress. She has no wheezes. She has no rales.  Abdominal: Soft. Bowel sounds are normal. She exhibits no distension. There is no tenderness. There is no rebound.  Musculoskeletal: She exhibits no edema.  Neurological: She is alert and oriented to person, place, and time. Coordination normal.  Skin: Skin is warm and dry.  Psychiatric: She  has a normal mood and affect.   Vitals:   03/17/17 0929  BP: (!) 158/100  Pulse: (!) 103  Temp: 99.1 F (37.3 C)  TempSrc: Oral  SpO2: 98%  Weight: 261 lb (118.4 kg)  Height: 5\' 2"  (1.575 m)   EKG: Rate 93, axis normal, intervals normal, low voltage, no st or t wave changes, no prior to compare, sinus     Assessment & Plan:

## 2017-03-17 NOTE — Patient Instructions (Addendum)
We have sent in the amlodipine to take for blood pressure 2 weeks before surgery and 2 weeks after surgery.   Work on decreasing salt in your diet and adding back exercise to help with the blood pressure as your blood pressure is running too high this year.   Blood pressure does damage to the heart and blood vessels long term (5-20 years) so we do have time to work with diet and exercise to help but we want to see you back in about 3-4 months to check on the blood pressure.   The EKG of the heart is normal which means that the blood pressure problems are fairly new (the last couple of years).    DASH Eating Plan DASH stands for "Dietary Approaches to Stop Hypertension." The DASH eating plan is a healthy eating plan that has been shown to reduce high blood pressure (hypertension). It may also reduce your risk for type 2 diabetes, heart disease, and stroke. The DASH eating plan may also help with weight loss. What are tips for following this plan? General guidelines  Avoid eating more than 2,300 mg (milligrams) of salt (sodium) a day. If you have hypertension, you may need to reduce your sodium intake to 1,500 mg a day.  Limit alcohol intake to no more than 1 drink a day for nonpregnant women and 2 drinks a day for men. One drink equals 12 oz of beer, 5 oz of wine, or 1 oz of hard liquor.  Work with your health care provider to maintain a healthy body weight or to lose weight. Ask what an ideal weight is for you.  Get at least 30 minutes of exercise that causes your heart to beat faster (aerobic exercise) most days of the week. Activities may include walking, swimming, or biking.  Work with your health care provider or diet and nutrition specialist (dietitian) to adjust your eating plan to your individual calorie needs. Reading food labels  Check food labels for the amount of sodium per serving. Choose foods with less than 5 percent of the Daily Value of sodium. Generally, foods with less  than 300 mg of sodium per serving fit into this eating plan.  To find whole grains, look for the word "whole" as the first word in the ingredient list. Shopping  Buy products labeled as "low-sodium" or "no salt added."  Buy fresh foods. Avoid canned foods and premade or frozen meals. Cooking  Avoid adding salt when cooking. Use salt-free seasonings or herbs instead of table salt or sea salt. Check with your health care provider or pharmacist before using salt substitutes.  Do not fry foods. Cook foods using healthy methods such as baking, boiling, grilling, and broiling instead.  Cook with heart-healthy oils, such as olive, canola, soybean, or sunflower oil. Meal planning   Eat a balanced diet that includes: ? 5 or more servings of fruits and vegetables each day. At each meal, try to fill half of your plate with fruits and vegetables. ? Up to 6-8 servings of whole grains each day. ? Less than 6 oz of lean meat, poultry, or fish each day. A 3-oz serving of meat is about the same size as a deck of cards. One egg equals 1 oz. ? 2 servings of low-fat dairy each day. ? A serving of nuts, seeds, or beans 5 times each week. ? Heart-healthy fats. Healthy fats called Omega-3 fatty acids are found in foods such as flaxseeds and coldwater fish, like sardines, salmon, and mackerel.  Limit how much you eat of the following: ? Canned or prepackaged foods. ? Food that is high in trans fat, such as fried foods. ? Food that is high in saturated fat, such as fatty meat. ? Sweets, desserts, sugary drinks, and other foods with added sugar. ? Full-fat dairy products.  Do not salt foods before eating.  Try to eat at least 2 vegetarian meals each week.  Eat more home-cooked food and less restaurant, buffet, and fast food.  When eating at a restaurant, ask that your food be prepared with less salt or no salt, if possible. What foods are recommended? The items listed may not be a complete list. Talk  with your dietitian about what dietary choices are best for you. Grains Whole-grain or whole-wheat bread. Whole-grain or whole-wheat pasta. Brown rice. [place]. Bulgur. Whole-grain and low-sodium cereals. Pita bread. Low-fat, low-sodium crackers. Whole-wheat flour tortillas. Vegetables Fresh or frozen vegetables (raw, steamed, roasted, or grilled). Low-sodium or reduced-sodium tomato and vegetable juice. Low-sodium or reduced-sodium tomato sauce and tomato paste. Low-sodium or reduced-sodium canned vegetables. Fruits All fresh, dried, or frozen fruit. Canned fruit in natural juice (without added sugar). Meat and other protein foods Skinless chicken or [place]. Ground chicken or [place]. Pork with fat trimmed off. Fish and seafood. Egg whites. Dried beans, peas, or lentils. Unsalted nuts, nut butters, and seeds. Unsalted canned beans. Lean cuts of beef with fat trimmed off. Low-sodium, lean deli meat. Dairy Low-fat (1%) or fat-free (skim) milk. Fat-free, low-fat, or reduced-fat cheeses. Nonfat, low-sodium ricotta or cottage cheese. Low-fat or nonfat yogurt. Low-fat, low-sodium cheese. Fats and oils Soft margarine without trans fats. Vegetable oil. Low-fat, reduced-fat, or light mayonnaise and salad dressings (reduced-sodium). Canola, safflower, olive, soybean, and sunflower oils. Avocado. Seasoning and other foods Herbs. Spices. Seasoning mixes without salt. Unsalted popcorn and pretzels. Fat-free sweets. What foods are not recommended? The items listed may not be a complete list. Talk with your dietitian about what dietary choices are best for you. Grains Baked goods made with fat, such as croissants, muffins, or some breads. Dry pasta or rice meal packs. Vegetables Creamed or fried vegetables. Vegetables in a cheese sauce. Regular canned vegetables (not low-sodium or reduced-sodium). Regular canned tomato sauce and paste (not low-sodium or reduced-sodium). Regular tomato and vegetable  juice (not low-sodium or reduced-sodium). Angie Fava. Olives. Fruits Canned fruit in a light or heavy syrup. Fried fruit. Fruit in cream or butter sauce. Meat and other protein foods Fatty cuts of meat. Ribs. Fried meat. Berniece Salines. Sausage. Bologna and other processed lunch meats. Salami. Fatback. Hotdogs. Bratwurst. Salted nuts and seeds. Canned beans with added salt. Canned or smoked fish. Whole eggs or egg yolks. Chicken or [place] with skin. Dairy Whole or 2% milk, cream, and half-and-half. Whole or full-fat cream cheese. Whole-fat or sweetened yogurt. Full-fat cheese. Nondairy creamers. Whipped toppings. Processed cheese and cheese spreads. Fats and oils Butter. Stick margarine. Lard. Shortening. Ghee. Bacon fat. Tropical oils, such as coconut, palm kernel, or palm oil. Seasoning and other foods Salted popcorn and pretzels. Onion salt, garlic salt, seasoned salt, table salt, and sea salt. Worcestershire sauce. Tartar sauce. Barbecue sauce. Teriyaki sauce. Soy sauce, including reduced-sodium. Steak sauce. Canned and packaged gravies. Fish sauce. Oyster sauce. Cocktail sauce. Horseradish that you find on the shelf. Ketchup. Mustard. Meat flavorings and tenderizers. Bouillon cubes. Hot sauce and Tabasco sauce. Premade or packaged marinades. Premade or packaged taco seasonings. Relishes. Regular salad dressings. Where to find more information:  National Heart, Lung, and Blood  Institute: https://wilson-eaton.com/  American Heart Association: www.heart.org Summary  The DASH eating plan is a healthy eating plan that has been shown to reduce high blood pressure (hypertension). It may also reduce your risk for type 2 diabetes, heart disease, and stroke.  With the DASH eating plan, you should limit salt (sodium) intake to 2,300 mg a day. If you have hypertension, you may need to reduce your sodium intake to 1,500 mg a day.  When on the DASH eating plan, aim to eat more fresh fruits and vegetables, whole grains,  lean proteins, low-fat dairy, and heart-healthy fats.  Work with your health care provider or diet and nutrition specialist (dietitian) to adjust your eating plan to your individual calorie needs. This information is not intended to replace advice given to you by your health care provider. Make sure you discuss any questions you have with your health care provider. Document Released: 05/02/2011 Document Revised: 05/06/2016 Document Reviewed: 05/06/2016 Elsevier Interactive Patient Education  2017 [employer].

## 2017-03-17 NOTE — Assessment & Plan Note (Signed)
We talked about how this is a risk factor for her high blood pressure. She is having back pains from poor diet and weight gain this year as well as hypertension. She is aware of the need to work on 10 pound weight loss in the next 3 months before our follow up here.

## 2017-03-17 NOTE — Assessment & Plan Note (Signed)
We talked about the risk of non-healing lesions with continued smoking and she intends to quit for the surgery and advised her that continuing off smoking forever would be ideal. Taking 4-5 black and mild daily. Reminded about risks and harms from cigarette smoking.

## 2017-03-17 NOTE — Assessment & Plan Note (Signed)
EKG done today without significant findings. Rx for amlodipine 10 mg daily before the surgery and afterwards. She would then like to work on diet and exercise changes to help with close monitoring.

## 2017-05-24 DIAGNOSIS — M5417 Radiculopathy, lumbosacral region: Secondary | ICD-10-CM | POA: Diagnosis not present

## 2017-05-24 DIAGNOSIS — M9903 Segmental and somatic dysfunction of lumbar region: Secondary | ICD-10-CM | POA: Diagnosis not present

## 2017-05-24 DIAGNOSIS — M9905 Segmental and somatic dysfunction of pelvic region: Secondary | ICD-10-CM | POA: Diagnosis not present

## 2017-07-07 ENCOUNTER — Ambulatory Visit: Payer: Self-pay | Admitting: *Deleted

## 2017-07-07 NOTE — Telephone Encounter (Signed)
Pt reports SOB for "many months, before December."  Mild at rest at times, severe at times with exertion. States friends have noticed "heavy" breathing worsening last few weeks. Pt states "probably because I am out of shape and a smoker." Denies any other symptoms; no dizziness, no pain. Reports "cough from smoking."  Pt also reports her partner states she "seems to stop breathing briefly when asleep." Pt reports she often "jerks" awake "like I can't breath."  States has been going on for many months. Appt made with Dr. Okey Duprerawford for tomorrow 07/08/17. Care advice given per protocol. Reason for Disposition . [1] MODERATE longstanding difficulty breathing (e.g., speaks in phrases, SOB even at rest, pulse 100-120) AND [2] SAME as normal  Answer Assessment - Initial Assessment Questions 1. RESPIRATORY STATUS: "Describe your breathing?" (e.g., wheezing, shortness of breath, unable to speak, severe coughing)      Severe SOB on exertion, mild at rest 2. ONSET: "When did this breathing problem begin?"      Began in December, worse last 2 weeks 3. PATTERN "Does the difficult breathing come and go, or has it been constant since it started?"     Mild at rest, severe with exertion 4. SEVERITY: "How bad is your breathing?" (e.g., mild, moderate, severe)    - MILD: No SOB at rest, mild SOB with walking, speaks normally in sentences, can lay down, no retractions, pulse < 100.    - MODERATE: SOB at rest, SOB with minimal exertion and prefers to sit, cannot lie down flat, speaks in phrases, mild retractions, audible wheezing, pulse 100-120.    - SEVERE: Very SOB at rest, speaks in single words, struggling to breathe, sitting hunched forward, retractions, pulse > 120      Severe with exertion 5. RECURRENT SYMPTOM: "Have you had difficulty breathing before?" If so, ask: "When was the last time?" and "What happened that time?"      NO 6. CARDIAC HISTORY: "Do you have any history of heart disease?" (e.g., heart attack,  angina, bypass surgery, angioplasty)      No 7. LUNG HISTORY: "Do you have any history of lung disease?"  (e.g., pulmonary embolus, asthma, emphysema)     No 8. CAUSE: "What do you think is causing the breathing problem?"      "Out of shape" 9. OTHER SYMPTOMS: "Do you have any other symptoms? (e.g., dizziness, runny nose, cough, chest pain, fever)     Cough "smoker".  Awakens quickly "jerks" at night, sob. 10. PREGNANCY: "Is there any chance you are pregnant?" "When was your last menstrual period?"       no  Protocols used: BREATHING DIFFICULTY-A-AH

## 2017-07-08 ENCOUNTER — Ambulatory Visit (INDEPENDENT_AMBULATORY_CARE_PROVIDER_SITE_OTHER)
Admission: RE | Admit: 2017-07-08 | Discharge: 2017-07-08 | Disposition: A | Discharge status: Home / Self Care | Payer: 59 | Source: Ambulatory Visit | Attending: Internal Medicine | Admitting: Internal Medicine

## 2017-07-08 ENCOUNTER — Encounter: Payer: Self-pay | Admitting: Internal Medicine

## 2017-07-08 ENCOUNTER — Ambulatory Visit: Payer: 59 | Admitting: Internal Medicine

## 2017-07-08 VITALS — BP 144/100 | HR 92 | Temp 98.7°F | Ht 62.0 in | Wt 262.0 lb

## 2017-07-08 DIAGNOSIS — R0602 Shortness of breath: Secondary | ICD-10-CM

## 2017-07-08 DIAGNOSIS — R0683 Snoring: Secondary | ICD-10-CM | POA: Diagnosis not present

## 2017-07-08 DIAGNOSIS — I10 Essential (primary) hypertension: Secondary | ICD-10-CM

## 2017-07-08 DIAGNOSIS — J410 Simple chronic bronchitis: Secondary | ICD-10-CM | POA: Diagnosis not present

## 2017-07-08 DIAGNOSIS — R079 Chest pain, unspecified: Secondary | ICD-10-CM | POA: Diagnosis not present

## 2017-07-08 MED ORDER — ALBUTEROL SULFATE HFA 108 (90 BASE) MCG/ACT IN AERS: 2.0000 | INHALATION_SPRAY | Freq: Four times a day (QID) | RESPIRATORY_TRACT | 0 refills | Status: DC | PRN

## 2017-07-08 MED ORDER — HYDROCHLOROTHIAZIDE 25 MG PO TABS: 25.0000 mg | ORAL_TABLET | Freq: Every day | ORAL | 3 refills | Status: DC

## 2017-07-08 NOTE — Patient Instructions (Addendum)
We will check the chest x-ray today.   We have sent in the blood pressure medicine.   We have sent in an inhaler to use when you cannot breathe, your chest is tight.

## 2017-07-10 DIAGNOSIS — G4733 Obstructive sleep apnea (adult) (pediatric): Secondary | ICD-10-CM | POA: Insufficient documentation

## 2017-07-10 DIAGNOSIS — R0602 Shortness of breath: Secondary | ICD-10-CM | POA: Insufficient documentation

## 2017-07-10 NOTE — Assessment & Plan Note (Signed)
Rx for albuterol to see if this helps symptoms. Suspect multifactorial including smoking, restriction from obesity, deconditioning. Checking CXR to rule out changes. Checking PFTs if CXR normal. Counseled to stop smoking and start gradual exercise program.

## 2017-07-10 NOTE — Assessment & Plan Note (Signed)
BP elevated and she has not taken meds today. Counseled if uncontrolled OSA BP could be elevated. Change amlodipine to hctz to increase therapy.

## 2017-07-10 NOTE — Progress Notes (Signed)
   Subjective:    Patient ID: Deanna QuillShenika M Richards, female    DOB: 03/02/1986, 32 y.o.   MRN: [id number]  HPI The patient is a 32 YO female coming in for SOB with exertion. This is going on since December. She has noticed this gradual onset. She is able to climb 1 flight stairs. She gets winded if she moves too fast or does too much. She is also having some problems with night time feeling like she cannot breathe and waking up gasping. Her sleeping partner has noticed some snoring. She is concerned about sleep apnea. She denies coughing much sputum. She is a current smoker and suspected that this was the cause of SOB and did not seek care until friends told her to. She is also overweight and feels that this plays into her symptoms. She denies cold symptoms or fevers or chills. She denies exercising right now.  Additionally she is feeling like her amlodipine is causing her to urinate a lot and causing her some problems and she has not taken today but wants to change the medication. She was not taking it regularly.   Review of Systems  Constitutional: Positive for activity change. Negative for appetite change, fatigue, fever and unexpected weight change.  HENT: Negative.   Eyes: Negative.   Respiratory: Positive for cough and shortness of breath. Negative for chest tightness.        Sleeping problems  Cardiovascular: Negative for chest pain, palpitations and leg swelling.  Gastrointestinal: Negative for abdominal distention, abdominal pain, constipation, diarrhea, nausea and vomiting.  Musculoskeletal: Negative.   Skin: Negative.   Neurological: Negative.   Psychiatric/Behavioral: Negative.       Objective:   Physical Exam  Constitutional: She is oriented to person, place, and time. She appears well-developed and well-nourished.  overweight  HENT:  Head: Normocephalic and atraumatic.  Eyes: EOM are normal.  Neck: Normal range of motion.  Cardiovascular: Normal rate and regular rhythm.    Pulmonary/Chest: Effort normal and breath sounds normal. No respiratory distress. She has no wheezes. She has no rales.  Abdominal: Soft. Bowel sounds are normal. She exhibits no distension. There is no tenderness. There is no rebound.  Musculoskeletal: She exhibits no edema.  Neurological: She is alert and oriented to person, place, and time. Coordination normal.  Skin: Skin is warm and dry.  Psychiatric: She has a normal mood and affect.   Vitals:   07/08/17 1025  BP: (!) 144/100  Pulse: 92  Temp: 98.7 F (37.1 C)  TempSrc: Oral  SpO2: 99%  Weight: 262 lb (118.8 kg)  Height: 5\' 2"  (1.575 m)      Assessment & Plan:

## 2017-07-10 NOTE — Assessment & Plan Note (Signed)
Time spent counseling about tobacco usage: 3 minutes. I have asked about smoking and is smoking same as usual. The patient is advised to quit. The patient is not willing to quit. They would like to try to quit in the next 3 months. We will follow up with them in 3 months.   

## 2017-07-10 NOTE — Assessment & Plan Note (Signed)
With some witnessed apnea episodes. Referral to home sleep test for assessment for OSA/severity.

## 2017-07-16 ENCOUNTER — Ambulatory Visit (INDEPENDENT_AMBULATORY_CARE_PROVIDER_SITE_OTHER): Payer: 59 | Admitting: Internal Medicine

## 2017-07-16 DIAGNOSIS — R0602 Shortness of breath: Secondary | ICD-10-CM

## 2017-07-16 LAB — PULMONARY FUNCTION TEST
DL/VA % pred: 152 %
DL/VA: 6.92 ml/min/mmHg/L
DLCO unc % pred: 103 %
DLCO unc: 22.21 ml/min/mmHg
FEF 25-75 Post: 2.53 L/sec
FEF 25-75 Pre: 1.89 L/sec
FEF2575-%CHANGE-POST: 33 %
FEF2575-%PRED-POST: 83 %
FEF2575-%Pred-Pre: 62 %
FEV1-%CHANGE-POST: 3 %
FEV1-%PRED-PRE: 74 %
FEV1-%Pred-Post: 77 %
FEV1-POST: 1.96 L
FEV1-PRE: 1.9 L
FEV1FVC-%Change-Post: 8 %
FEV1FVC-%PRED-PRE: 97 %
FEV6-%Change-Post: -4 %
FEV6-%Pred-Post: 74 %
FEV6-%Pred-Pre: 77 %
FEV6-POST: 2.19 L
FEV6-PRE: 2.29 L
FEV6FVC-%PRED-POST: 101 %
FEV6FVC-%PRED-PRE: 101 %
FVC-%CHANGE-POST: -4 %
FVC-%PRED-POST: 73 %
FVC-%PRED-PRE: 76 %
FVC-POST: 2.19 L
FVC-PRE: 2.29 L
POST FEV1/FVC RATIO: 90 %
PRE FEV6/FVC RATIO: 100 %
Post FEV6/FVC ratio: 100 %
Pre FEV1/FVC ratio: 83 %
RV % PRED: 94 %
RV: 1.22 L
TLC % pred: 75 %
TLC: 3.6 L

## 2017-07-16 NOTE — Progress Notes (Signed)
PFT completed today 07/16/17 per Dr. Okey Duprerawford.

## 2017-08-27 DIAGNOSIS — G4733 Obstructive sleep apnea (adult) (pediatric): Secondary | ICD-10-CM | POA: Diagnosis not present

## 2017-08-28 ENCOUNTER — Telehealth: Payer: Self-pay | Admitting: Internal Medicine

## 2017-08-28 DIAGNOSIS — G4733 Obstructive sleep apnea (adult) (pediatric): Secondary | ICD-10-CM | POA: Diagnosis not present

## 2017-08-28 NOTE — Telephone Encounter (Signed)
Copied from CRM [id number]. Topic: Quick Communication - Rx Refill/Question >> Aug 28, 2017  8:36 AM Maia Pettiesrtiz, Kristie S wrote: Medication: fluticasone (FLONASE) 50 MCG/ACT nasal spray - pt is out - RX expired so cannot refill Has the patient contacted their pharmacy? No bc expired - ordered 08/2015 Preferred Pharmacy (with phone number or street name): [id number] [place], [place] - [address] [phone] (Phone) [phone] (Fax)

## 2017-08-28 NOTE — Telephone Encounter (Signed)
Pt requesting Flonase. Not listed on med list since 2017. Pt was called and offered an appointment in the office. She stated that she would call back to make an appointment. Her last office visit was 02/11/819 with c/o shortness of breath. Her provider is Hillard DankerElizabeth Crawford, MD.

## 2017-08-29 ENCOUNTER — Other Ambulatory Visit: Payer: Self-pay | Admitting: *Deleted

## 2017-08-29 DIAGNOSIS — R0602 Shortness of breath: Secondary | ICD-10-CM

## 2017-09-18 ENCOUNTER — Encounter: Payer: Self-pay | Admitting: Internal Medicine

## 2017-09-18 ENCOUNTER — Ambulatory Visit: Payer: 59 | Admitting: Internal Medicine

## 2017-09-18 DIAGNOSIS — G4733 Obstructive sleep apnea (adult) (pediatric): Secondary | ICD-10-CM

## 2017-09-18 MED ORDER — FLUTICASONE PROPIONATE 50 MCG/ACT NA SUSP: 2.0000 | Freq: Every day | NASAL | 11 refills | Status: DC

## 2017-09-18 NOTE — Assessment & Plan Note (Signed)
Advised CPAP treatment and she wishes to think about it. She is given information and talked to about the risks of untreated sleep apnea including hypertension, increased risk of cv disease and stroke.

## 2017-09-18 NOTE — Patient Instructions (Signed)

## 2017-09-18 NOTE — Progress Notes (Signed)
   Subjective:    Patient ID: Deanna Richards, female    DOB: 12/25/1985, 32 y.o.   MRN: [id number]  HPI The patient is a 32 YO female coming in to discuss sleep test results. She had moderate sleep apnea AHI 29.2/hr. She thought that since it was moderate she might not need CPAP. She wants to know more about severity and prognosis. Denies excessive daytime sleepiness. Denies change in health since her last visit. Still getting some SOB with exertion. PFTs normal.   Review of Systems  Constitutional: Negative.   Respiratory: Positive for shortness of breath. Negative for cough and chest tightness.   Cardiovascular: Negative for chest pain, palpitations and leg swelling.  Gastrointestinal: Negative for abdominal distention, abdominal pain, constipation, diarrhea, nausea and vomiting.  Musculoskeletal: Negative.   Skin: Negative.   Neurological: Negative.       Objective:   Physical Exam  Constitutional: She is oriented to person, place, and time. She appears well-developed and well-nourished.  HENT:  Head: Normocephalic and atraumatic.  Eyes: EOM are normal.  Neck: Normal range of motion.  Cardiovascular: Normal rate and regular rhythm.  Pulmonary/Chest: Effort normal and breath sounds normal. No respiratory distress. She has no wheezes. She has no rales.  Abdominal: Soft.  Musculoskeletal: She exhibits no edema.  Neurological: She is alert and oriented to person, place, and time. Coordination normal.  Skin: Skin is warm and dry.   Vitals:   09/18/17 1516  BP: (!) 150/96  Pulse: (!) 101  Temp: 98.5 F (36.9 C)  TempSrc: Oral  SpO2: 97%  Weight: 259 lb (117.5 kg)  Height: 5\' 2"  (1.575 m)      Assessment & Plan:

## 2017-09-23 ENCOUNTER — Encounter: Payer: Self-pay | Admitting: Internal Medicine

## 2017-09-23 ENCOUNTER — Ambulatory Visit: Payer: 59 | Admitting: Internal Medicine

## 2017-09-23 DIAGNOSIS — A084 Viral intestinal infection, unspecified: Secondary | ICD-10-CM | POA: Diagnosis not present

## 2017-09-23 NOTE — Patient Instructions (Addendum)
Drink plenty of liquids and it is okay to add back foods gradually.   It is okay to take imodium over the counter to stop the diarrhea.    Food Choices to Help Relieve Diarrhea, Adult When you have diarrhea, the foods you eat and your eating habits are very important. Choosing the right foods and drinks can help:  Relieve diarrhea.  Replace lost fluids and nutrients.  Prevent dehydration.  What general guidelines should I follow? Relieving diarrhea  Choose foods with less than 2 g or .07 oz. of fiber per serving.  Limit fats to less than 8 tsp (38 g or 1.34 oz.) a day.  Avoid the following: ? Foods and beverages sweetened with high-fructose corn syrup, honey, or sugar alcohols such as xylitol, sorbitol, and mannitol. ? Foods that contain a lot of fat or sugar. ? Fried, greasy, or spicy foods. ? High-fiber grains, breads, and cereals. ? Raw fruits and vegetables.  Eat foods that are rich in probiotics. These foods include dairy products such as yogurt and fermented milk products. They help increase healthy bacteria in the stomach and intestines (gastrointestinal tract, or GI tract).  If you have lactose intolerance, avoid dairy products. These may make your diarrhea worse.  Take medicine to help stop diarrhea (antidiarrheal medicine) only as told by your health care provider. Replacing nutrients  Eat small meals or snacks every 3-4 hours.  Eat bland foods, such as white rice, toast, or baked potato, until your diarrhea starts to get better. Gradually reintroduce nutrient-rich foods as tolerated or as told by your health care provider. This includes: ? Well-cooked protein foods. ? Peeled, seeded, and soft-cooked fruits and vegetables. ? Low-fat dairy products.  Take vitamin and mineral supplements as told by your health care provider. Preventing dehydration   Start by sipping water or a special solution to prevent dehydration (oral rehydration solution, ORS). Urine that is  clear or pale yellow means that you are getting enough fluid.  Try to drink at least 8-10 cups of fluid each day to help replace lost fluids.  You may add other liquids in addition to water, such as clear juice or decaffeinated sports drinks, as tolerated or as told by your health care provider.  Avoid drinks with caffeine, such as coffee, tea, or soft drinks.  Avoid alcohol. What foods are recommended? The items listed may not be a complete list. Talk with your health care provider about what dietary choices are best for you. Grains White rice. White, [place], or pita breads (fresh or toasted), including plain rolls, buns, or bagels. White pasta. Saltine, soda, or graham crackers. Pretzels. Low-fiber cereal. Cooked cereals made with water (such as cornmeal, farina, or cream cereals). Plain muffins. Matzo. Melba toast. Zwieback. Vegetables Potatoes (without the skin). Most well-cooked and canned vegetables without skins or seeds. Tender lettuce. Fruits Apple sauce. Fruits canned in juice. Cooked apricots, cherries, grapefruit, peaches, pears, or plums. Fresh bananas and cantaloupe. Meats and other protein foods Baked or boiled chicken. Eggs. Tofu. Fish. Seafood. Smooth nut butters. Ground or well-cooked tender beef, ham, veal, lamb, pork, or poultry. Dairy Plain yogurt, kefir, and unsweetened liquid yogurt. Lactose-free milk, buttermilk, skim milk, or soy milk. Low-fat or nonfat hard cheese. Beverages Water. Low-calorie sports drinks. Fruit juices without pulp. Strained tomato and vegetable juices. Decaffeinated teas. Sugar-free beverages not sweetened with sugar alcohols. Oral rehydration solutions, if approved by your health care provider. Seasoning and other foods Bouillon, broth, or soups made from recommended foods. What foods  are not recommended? The items listed may not be a complete list. Talk with your health care provider about what dietary choices are best for you. Grains Whole  grain, whole wheat, bran, or rye breads, rolls, pastas, and crackers. Wild or brown rice. Whole grain or bran cereals. Barley. Oats and oatmeal. Corn tortillas or taco shells. Granola. Popcorn. Vegetables Raw vegetables. Fried vegetables. Cabbage, broccoli, Brussels sprouts, artichokes, baked beans, beet greens, corn, kale, legumes, peas, sweet potatoes, and yams. Potato skins. Cooked spinach and cabbage. Fruits Dried fruit, including raisins and dates. Raw fruits. Stewed or dried prunes. Canned fruits with syrup. Meat and other protein foods Fried or fatty meats. Deli meats. Chunky nut butters. Nuts and seeds. Beans and lentils. Tomasa Blase. Hot dogs. Sausage. Dairy High-fat cheeses. Whole milk, chocolate milk, and beverages made with milk, such as milk shakes. Half-and-half. Cream. sour cream. Ice cream. Beverages Caffeinated beverages (such as coffee, tea, soda, or energy drinks). Alcoholic beverages. Fruit juices with pulp. Prune juice. Soft drinks sweetened with high-fructose corn syrup or sugar alcohols. High-calorie sports drinks. Fats and oils Butter. Cream sauces. Margarine. Salad oils. Plain salad dressings. Olives. Avocados. Mayonnaise. Sweets and desserts Sweet rolls, doughnuts, and sweet breads. Sugar-free desserts sweetened with sugar alcohols such as xylitol and sorbitol. Seasoning and other foods Honey. Hot sauce. Chili powder. Gravy. Cream-based or milk-based soups. Pancakes and waffles. Summary  When you have diarrhea, the foods you eat and your eating habits are very important.  Make sure you get at least 8-10 cups of fluid each day, or enough to keep your urine clear or pale yellow.  Eat bland foods and gradually reintroduce healthy, nutrient-rich foods as tolerated, or as told by your health care provider.  Avoid high-fiber, fried, greasy, or spicy foods. This information is not intended to replace advice given to you by your health care provider. Make sure you discuss any  questions you have with your health care provider. Document Released: 08/03/2003 Document Revised: 05/10/2016 Document Reviewed: 05/10/2016 Elsevier Interactive Patient Education  [employer].

## 2017-09-23 NOTE — Assessment & Plan Note (Signed)
Advised she in contagious while actively having diarrhea and within 24 hours of last fever. Work note given to return Friday. Can take imodium over the counter. Push fluids and BRAT diet information given. If no improvement or worsening or blood in stool call back.

## 2017-09-23 NOTE — Progress Notes (Signed)
   Subjective:    Patient ID: Deanna Richards, female    DOB: 1986/02/09, 32 y.o.   MRN: [id number]  HPI The patient is a 32 YO female coming in for viral illness starting Sunday evening. She was having fevers and chills and muscle aches. She then got diarrhea and it continued to Monday. She was having 6-7 episodes per day. She did not have to wake up at night to defecate. She has had some improvement since then. She has not been eating well due to feeling like this made her go to the bathroom. She is drinking plenty of fluids. Denies blood in the stool. No known sick contacts or new restaurants. Denies dizziness or lightheadedness. Denies nausea or vomiting.   Review of Systems  Constitutional: Positive for activity change, appetite change, chills, fatigue and fever.  HENT: Negative.   Eyes: Negative.   Respiratory: Negative for cough, chest tightness and shortness of breath.   Cardiovascular: Negative for chest pain, palpitations and leg swelling.  Gastrointestinal: Positive for diarrhea. Negative for abdominal distention, abdominal pain, constipation, nausea and vomiting.  Musculoskeletal: Negative.   Skin: Negative.   Neurological: Negative.   Psychiatric/Behavioral: Negative.       Objective:   Physical Exam  Constitutional: She is oriented to person, place, and time. She appears well-developed and well-nourished.  HENT:  Head: Normocephalic and atraumatic.  Eyes: EOM are normal.  Neck: Normal range of motion.  Cardiovascular: Normal rate and regular rhythm.  Pulmonary/Chest: Effort normal and breath sounds normal. No respiratory distress. She has no wheezes. She has no rales.  Abdominal: Soft. Bowel sounds are normal. She exhibits no distension. There is no tenderness. There is no rebound.  Slightly increased bs  Musculoskeletal: She exhibits no edema.  Neurological: She is alert and oriented to person, place, and time. Coordination normal.  Skin: Skin is warm and dry.  Capillary refill takes less than 2 seconds.  Psychiatric: She has a normal mood and affect.   Vitals:   09/23/17 0955  BP: (!) 152/92  Pulse: (!) 110  Temp: (!) 100.7 F (38.2 C)  TempSrc: Oral  SpO2: 94%  Weight: 252 lb (114.3 kg)  Height:  (1.575 m)      Assessment & Plan:

## 2017-10-16 ENCOUNTER — Ambulatory Visit: Payer: 59 | Admitting: Family Medicine

## 2017-10-27 NOTE — Progress Notes (Signed)
Tawana ScaleZach Lilly Gasser D.O. [facility] [address] [place], [place] [address] Phone: [phone] Subjective:     CC: Bilateral foot pain  UXL:[id number]:Subjective  Deanna Richards is a 32 y.o. female coming in with complaint of bilateral foot pain. States she feels numbness and tingling. Also feels a tearing in her heel. First step in the morning is tight.  Patient was seen greater than a year ago for posterior tibialis tendinitis.  Has responded well to injections in the past.  Having worsening symptoms again.  States that she did get the shoes but has not noticed as much improvement.  Been wearing the custom orthotics on a more daily basis as well.     Back pain as well.  Has responded well to osteopathic manipulation in the past. X-rays patient has had been unremarkable.  Morbid tightness.  Not working on his regular.  Knows when she does it seems to be better.  No radiation of the legs or any numbness or tingling  Past Medical History:  Diagnosis Date  . 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 PCOS (polycystic ovarian syndrome)    no current med.   Past Surgical History:  Procedure Laterality Date  . CARPAL TUNNEL RELEASE Right 11/05/2013   Procedure: CARPAL TUNNEL RELEASE ENDOSCOPIC RIGHT WRIST;  Surgeon: Sheral Apleyimothy D Murphy, MD;  Location: [facility];  Service: Orthopedics;  Laterality: Right;  . NO PAST SURGERIES     Social History   Socioeconomic History  . Marital status: Single    Spouse name: Not on file  . Number of children: Not on file  . Years of education: Not on file  . Highest education level: Not on file  Occupational History  . Not on file  Social Needs  . Financial resource strain: Not on file  . Food insecurity:    Worry: Not on file    Inability: Not on file  . Transportation needs:    Medical: Not on file    Non-medical: Not on file  Tobacco Use  . Smoking status: Current Every Day Smoker    Years: 5.00    Types: Cigars  .  Smokeless tobacco: Never Used  . Tobacco comment: 1-2 Black and Mild/day  Substance and Sexual Activity  . Alcohol use: Yes    Alcohol/week: 0.0 oz    Comment: weekends  . Drug use: No  . Sexual activity: Yes    Birth control/protection: None, Pill  Lifestyle  . Physical activity:    Days per week: Not on file    Minutes per session: Not on file  . Stress: Not on file  Relationships  . Social connections:    Talks on phone: Not on file    Gets together: Not on file    Attends religious service: Not on file    Active member of club or organization: Not on file    Attends meetings of clubs or organizations: Not on file    Relationship status: Not on file  Other Topics Concern  . Not on file  Social History Narrative  . Not on file   No Known Allergies Family History  Adopted: Yes  Problem Relation Age of Onset  . Hypertension Mother      Past medical history, social, surgical and family history all reviewed in electronic medical record.  No pertanent information unless stated regarding to the chief complaint.   Review of Systems:Review of systems updated and as accurate as of  10/28/17  No headache, visual changes, nausea, vomiting, diarrhea, constipation, dizziness, abdominal pain, skin rash, fevers, chills, night sweats, weight loss, swollen lymph nodes, body aches, joint swelling, muscle aches, chest pain, shortness of breath, mood changes.  Positive muscle aches  Objective  Blood pressure 140/80, pulse 95, height 5\' 2"  (1.575 m), weight 255 lb (115.7 kg), SpO2 98 %. Systems examined below as of 10/28/17   General: No apparent distress alert and oriented x3 mood and affect normal, dressed appropriately.  HEENT: Pupils equal, extraocular movements intact  Respiratory: Patient's speak in full sentences and does not appear short of breath  Cardiovascular: No lower extremity edema, non tender, no erythema  Skin: Warm dry intact with no signs of infection or rash on  extremities or on axial skeleton.  Abdomen: Soft nontender  Neuro: Cranial nerves II through XII are intact, neurovascularly intact in all extremities with 2+ DTRs and 2+ pulses.  Lymph: No lymphadenopathy of posterior or anterior cervical chain or axillae bilaterally.  Gait normal with good balance and coordination.  MSK:  Non tender with full range of motion and good stability and symmetric strength and tone of shoulders, elbows, wrist, hip, knee bilaterally.  Ankle exam bilaterally shows overpronation of the hindfoot.  Some insufficiency of the left posterior tibialis tendon noted.  Patient has some breakdown longitudinal and transverse arch left greater than right.  Minimally tender to the posterior tibialis.  Full range of motion of the ankle itself  Back exam shows loss of lordosis with poor core strength.  Positive Faber on the right side.  Pain over the right sacroiliac joint.  Negative straight leg test.  Neurovascularly intact with 5 out of 5 strength  Limited musculoskeletal ultrasound was performed and interpreted by Judi Saa  Limited ultrasound of patient's left and right posterior tibialis shows hypoechoic changes with increasing Doppler flow but no acute tear is appreciated.  Osteopathic findingst T9 extended rotated and side bent left L2 flexed rotated and side bent right Sacrum right on right      Impression and Recommendations:     This case required medical decision making of moderate complexity.      Note: This dictation was prepared with Dragon dictation along with smaller phrase technology. Any transcriptional errors that result from this process are unintentional.

## 2017-10-28 ENCOUNTER — Encounter: Payer: Self-pay | Admitting: Family Medicine

## 2017-10-28 ENCOUNTER — Ambulatory Visit: Payer: Self-pay

## 2017-10-28 ENCOUNTER — Ambulatory Visit: Payer: 59 | Admitting: Family Medicine

## 2017-10-28 ENCOUNTER — Encounter

## 2017-10-28 VITALS — BP 140/80 | HR 95 | Ht 62.0 in | Wt 255.0 lb

## 2017-10-28 DIAGNOSIS — M79671 Pain in right foot: Secondary | ICD-10-CM

## 2017-10-28 DIAGNOSIS — M999 Biomechanical lesion, unspecified: Secondary | ICD-10-CM | POA: Diagnosis not present

## 2017-10-28 DIAGNOSIS — M76821 Posterior tibial tendinitis, right leg: Secondary | ICD-10-CM | POA: Diagnosis not present

## 2017-10-28 DIAGNOSIS — M545 Low back pain, unspecified: Secondary | ICD-10-CM

## 2017-10-28 DIAGNOSIS — M76822 Posterior tibial tendinitis, left leg: Secondary | ICD-10-CM

## 2017-10-28 DIAGNOSIS — M79672 Pain in left foot: Principal | ICD-10-CM

## 2017-10-28 DIAGNOSIS — G8929 Other chronic pain: Secondary | ICD-10-CM | POA: Diagnosis not present

## 2017-10-28 MED ORDER — VITAMIN D (ERGOCALCIFEROL) 1.25 MG (50000 UNIT) PO CAPS: 50000.0000 [IU] | ORAL_CAPSULE | ORAL | 0 refills | Status: DC

## 2017-10-28 NOTE — Patient Instructions (Signed)
Great to see you  You should do well  Change the orthotic Maybe another pair of shoes to rotate in to the rotation 1-2 days a week.  Exercises 3 times a week.  pennsaid pinkie amount topically 2 times daily as needed.  Once weekly vitamin D again   See me again in 4 weeks

## 2017-10-28 NOTE — Assessment & Plan Note (Signed)
Muscle imbalances.  Has responded well to injections previously.  Discussed icing regimen and home exercise.  Follow-up again in 4 weeks

## 2017-10-28 NOTE — Assessment & Plan Note (Signed)
Decision today to treat with OMT was based on Physical Exam  After verbal consent patient was treated with HVLA, ME, FPR techniques in  thoracic, lumbar and sacral areas  Patient tolerated the procedure well with improvement in symptoms  Patient given exercises, stretches and lifestyle modifications  See medications in patient instructions if given  Patient will follow up in 4 weeks 

## 2017-10-28 NOTE — Assessment & Plan Note (Signed)
Discussed with patient in great length.  Home exercises given.  Declined any type of injection at this point.  Change to patient's custom orthotics with different postings today.  Follow-up again in 4 weeks worsening symptoms consider injection

## 2017-11-11 ENCOUNTER — Ambulatory Visit: Payer: Self-pay | Admitting: Internal Medicine

## 2017-11-11 NOTE — Telephone Encounter (Signed)
Duplicate encounter please disregard this encounter

## 2017-11-11 NOTE — Telephone Encounter (Signed)
Pt states that last night the paramedics came to her house because according to her the L eye protruded out and she put in back in place before they arrived. She states they checked her Bp and  According to her they obtained a top number of 136 but could not obtain a bottom number.She reports she went to her eye Dr today  and he checked her bp and it was 180/130 but she questions its accuracy .She states Dr Francesca OmanNobles was referring her to a retinal specialist and told her to followup with pcp for bp and thyroid evaul - according to pt.  Reason for Disposition . Systolic BP  >= 180 OR Diastolic >= 110  Answer Assessment - Initial Assessment Questions 1. BLOOD PRESSURE: "What is the blood pressure?" "Did you take at least two measurements 5 minutes apart?"       Last Bp   180/130 at  eye Dr pt unsure if it was an accurate   2. ONSET: "When did you take your blood pressure?"        2  Hours ago   3. HOW: "How did you obtain the blood pressure?" (e.g., visiting nurse, automatic home BP monitor)    At  [facility] machine  4. HISTORY: "Do you have a history of high blood pressure?"     Yes  5. MEDICATIONS: "Are you taking any medications for blood pressure?" "Have you missed any doses recently?"     Has  A rx of Hctz but not taking it   6. OTHER SYMPTOMS: "Do you have any symptoms?" (e.g., headache, chest pain, blurred vision, difficulty breathing, weakness)      Slight headache over l  Eye - Last night pt had issue with l  Eye ems came checked on pt  bp was 136 systolic unable to obtain diastolic Pt was seen  today by Dr Francesca OmanNobles for the eye issue and that is when he took the recorded bp above   7. PREGNANCY: "Is there any chance you are pregnant?" "When was your last menstrual period?"       IREEG PERIOD PCOS  Protocols used: HIGH BLOOD PRESSURE-A-AH

## 2017-11-12 ENCOUNTER — Other Ambulatory Visit (INDEPENDENT_AMBULATORY_CARE_PROVIDER_SITE_OTHER): Payer: 59

## 2017-11-12 ENCOUNTER — Encounter: Payer: Self-pay | Admitting: Internal Medicine

## 2017-11-12 ENCOUNTER — Ambulatory Visit: Payer: 59 | Admitting: Family

## 2017-11-12 ENCOUNTER — Encounter: Payer: Self-pay | Admitting: Family

## 2017-11-12 VITALS — BP 148/84 | HR 94 | Temp 98.5°F | Ht 62.0 in | Wt 252.0 lb

## 2017-11-12 DIAGNOSIS — I10 Essential (primary) hypertension: Secondary | ICD-10-CM

## 2017-11-12 DIAGNOSIS — H0520 Unspecified exophthalmos: Secondary | ICD-10-CM

## 2017-11-12 LAB — TSH: TSH: 1.01 u[IU]/mL (ref 0.35–4.50)

## 2017-11-12 LAB — THYROID PROFILE - CHCC
Free Thyroxine Index: 2.1 (ref 1.4–3.8)
T3 Uptake: 30 % (ref 22–35)
T4, Total: 7.1 ug/dL (ref 5.1–11.9)

## 2017-11-12 NOTE — Progress Notes (Signed)
Deanna Richards is a 32 y.o. female with the following history as recorded in EpicCare:  Patient Active Problem List   Diagnosis Date Noted  . 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 Viral gastroenteritis 09/23/2017  . SOB (shortness of breath) 07/10/2017  . OSA (obstructive sleep apnea) 07/10/2017  . Low back pain 05/24/2016  . Allergic rhinitis 01/31/2016  . Routine general medical examination at a health care facility 09/05/2015  . Nonallopathic lesion of sacral region 11/21/2014  . Nonallopathic lesion of thoracic region 11/21/2014  . Nonallopathic lesion of lumbosacral region 11/21/2014  . INSOMNIA 05/29/2010  . Morbid obesity (HCC) 02/22/2010  . Smokers' cough (HCC) 02/22/2010  . Essential hypertension 02/22/2010    Current Outpatient Medications  Medication Sig Dispense Refill  . fluticasone (FLONASE) 50 MCG/ACT nasal spray Place 2 sprays into both nostrils daily. 16 g 11  . albuterol (PROVENTIL HFA;VENTOLIN HFA) 108 (90 Base) MCG/ACT inhaler Inhale 2 puffs into the lungs every 6 (six) hours as needed for wheezing or shortness of breath. (Patient not taking: Reported on 11/12/2017) 1 Inhaler 0  . folic acid (FOLVITE) 1 MG tablet Take 1 mg by mouth daily.    . hydrochlorothiazide (HYDRODIURIL) 25 MG tablet Take 1 tablet (25 mg total) by mouth daily. (Patient not taking: Reported on 11/12/2017) 90 tablet 3  . Vitamin D, Ergocalciferol, (DRISDOL) 50000 units CAPS capsule Take 1 capsule (50,000 Units total) by mouth every 7 (seven) days. (Patient not taking: Reported on 11/12/2017) 12 capsule 0   No current facility-administered medications for this visit.     Allergies: Patient has no known allergies.  Past Medical History:  Diagnosis Date  . 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 PCOS (polycystic ovarian syndrome)    no current med.    Past Surgical History:  Procedure Laterality Date  . CARPAL TUNNEL RELEASE Right 11/05/2013   Procedure: CARPAL TUNNEL RELEASE ENDOSCOPIC RIGHT WRIST;  Surgeon: Sheral Apleyimothy D Murphy, MD;  Location: [facility];  Service: Orthopedics;  Laterality: Right;  . NO PAST SURGERIES      Family History  Adopted: Yes  Problem Relation Age of Onset  . Hypertension Mother     Social History   Tobacco Use  . Smoking status: Current Every Day Smoker    Years: 5.00    Types: Cigars  . Smokeless tobacco: Never Used  . Tobacco comment: 1-2 Black and Mild/day  Substance Use Topics  . Alcohol use: Yes    Alcohol/week: 0.0 oz    Comment: weekends    Subjective:  Patient presents with concerns for recent episode of protruding OS Monday night; denies any injury or trauma to the eye; notes that she rubbed her upper lid and somehow the eye "just came out" of the socket; was able to see with no difficulty; called EMS and blood pressure was slightly elevated that evening; saw eye doctor yesterday and pressure was elevated; eye doctor requesting blood pressure evaluation/ thyroid evaluation; patient is scheduled to see retinal specialist in July; Does have history of hypertension- not currently taking her medication; admits just does not like to take medication; Denies any chest pain, shortness of breath, blurred vision or headache.  Objective:  Vitals:   11/12/17 0915  BP: (!) 148/84  Pulse: 94  Temp: 98.5 F (36.9 C)  TempSrc: Oral  SpO2: 99%  Weight: 252 lb (114.3 kg)  Height: 5\' 2"  (1.575 m)    General: Well developed, well nourished,  in no acute distress  Skin : Warm and dry.  Head: Normocephalic and atraumatic  Eyes: Sclera and conjunctiva clear; pupils round and reactive to light; extraocular movements intact  Neck: Supple without thyromegaly, adenopathy  Lungs: Respirations unlabored; clear to auscultation bilaterally without wheeze, rales, rhonchi  CVS exam: normal rate and regular rhythm.  Neurologic: Alert and oriented; speech intact; face symmetrical; moves all  extremities well; CNII-XII intact without focal deficit   Assessment:  1. Exophthalmia   2. Essential hypertension     Plan:  1. Keep planned follow up with retinal specialist check TSH and thyroid profile today; follow-up to be determined. 2. Stressed need to take medication as prescribed; she has Rx for HCTZ and will take medication daily; follow-up with her PCP for CPE in early August;    Return for CPE with Okey Dupre in early August.  Orders Placed This Encounter  Procedures  . TSH    Standing Status:   Future    Standing Expiration Date:   11/12/2018  . Thyroid Profile    Standing Status:   Future    Standing Expiration Date:   11/13/2018    Requested Prescriptions    No prescriptions requested or ordered in this encounter

## 2017-12-29 ENCOUNTER — Encounter: Payer: 59 | Admitting: Internal Medicine

## 2017-12-30 ENCOUNTER — Encounter: Payer: 59 | Admitting: Internal Medicine

## 2018-01-13 DIAGNOSIS — H35463 Secondary vitreoretinal degeneration, bilateral: Secondary | ICD-10-CM | POA: Diagnosis not present

## 2018-01-13 DIAGNOSIS — H35412 Lattice degeneration of retina, left eye: Secondary | ICD-10-CM | POA: Diagnosis not present

## 2018-01-28 ENCOUNTER — Ambulatory Visit: Payer: 59 | Admitting: Family Medicine

## 2018-01-28 ENCOUNTER — Ambulatory Visit: Payer: Self-pay

## 2018-01-28 ENCOUNTER — Ambulatory Visit (INDEPENDENT_AMBULATORY_CARE_PROVIDER_SITE_OTHER): Payer: 59 | Admitting: Internal Medicine

## 2018-01-28 ENCOUNTER — Other Ambulatory Visit (INDEPENDENT_AMBULATORY_CARE_PROVIDER_SITE_OTHER): Payer: 59

## 2018-01-28 ENCOUNTER — Encounter: Payer: Self-pay | Admitting: Family Medicine

## 2018-01-28 ENCOUNTER — Encounter: Payer: Self-pay | Admitting: Internal Medicine

## 2018-01-28 VITALS — BP 160/90 | HR 98 | Ht 62.0 in | Wt 253.0 lb

## 2018-01-28 VITALS — BP 160/100 | HR 90 | Temp 98.2°F | Ht 62.0 in | Wt 253.0 lb

## 2018-01-28 DIAGNOSIS — Z0000 Encounter for general adult medical examination without abnormal findings: Secondary | ICD-10-CM | POA: Diagnosis not present

## 2018-01-28 DIAGNOSIS — I10 Essential (primary) hypertension: Secondary | ICD-10-CM | POA: Diagnosis not present

## 2018-01-28 DIAGNOSIS — M25572 Pain in left ankle and joints of left foot: Secondary | ICD-10-CM

## 2018-01-28 DIAGNOSIS — G8929 Other chronic pain: Secondary | ICD-10-CM

## 2018-01-28 DIAGNOSIS — M545 Low back pain, unspecified: Secondary | ICD-10-CM

## 2018-01-28 DIAGNOSIS — M999 Biomechanical lesion, unspecified: Secondary | ICD-10-CM | POA: Diagnosis not present

## 2018-01-28 DIAGNOSIS — M76821 Posterior tibial tendinitis, right leg: Secondary | ICD-10-CM | POA: Diagnosis not present

## 2018-01-28 DIAGNOSIS — M76822 Posterior tibial tendinitis, left leg: Secondary | ICD-10-CM

## 2018-01-28 LAB — CBC
HCT: 39.8 % (ref 36.0–46.0)
Hemoglobin: 14 g/dL (ref 12.0–15.0)
MCHC: 35.2 g/dL (ref 30.0–36.0)
MCV: 87.2 fl (ref 78.0–100.0)
Platelets: 215 10*3/uL (ref 150.0–400.0)
RBC: 4.56 Mil/uL (ref 3.87–5.11)
RDW: 14.3 % (ref 11.5–15.5)
WBC: 5.5 10*3/uL (ref 4.0–10.5)

## 2018-01-28 LAB — LIPID PANEL
CHOLESTEROL: 118 mg/dL (ref 0–200)
HDL: 39.2 mg/dL (ref >39.00)
LDL CALC: 53 mg/dL (ref 0–99)
NonHDL: 78.91
Total CHOL/HDL Ratio: 3
Triglycerides: 132 mg/dL (ref 0.0–149.0)
VLDL: 26.4 mg/dL (ref 0.0–40.0)

## 2018-01-28 LAB — COMPREHENSIVE METABOLIC PANEL
ALBUMIN: 4.3 g/dL (ref 3.5–5.2)
ALT: 26 U/L (ref 0–35)
AST: 22 U/L (ref 0–37)
Alkaline Phosphatase: 48 U/L (ref 39–117)
BILIRUBIN TOTAL: 0.4 mg/dL (ref 0.2–1.2)
BUN: 13 mg/dL (ref 6–23)
CHLORIDE: 100 meq/L (ref 96–112)
CO2: 29 mEq/L (ref 19–32)
CREATININE: 0.8 mg/dL (ref 0.40–1.20)
Calcium: 9.3 mg/dL (ref 8.4–10.5)
GFR: 107.31 mL/min (ref >60.00)
Glucose, Bld: 85 mg/dL (ref 70–99)
Potassium: 3.4 mEq/L — ABNORMAL LOW (ref 3.5–5.1)
SODIUM: 138 meq/L (ref 135–145)
Total Protein: 7.3 g/dL (ref 6.0–8.3)

## 2018-01-28 LAB — MAGNESIUM: MAGNESIUM: 1.8 mg/dL (ref 1.5–2.5)

## 2018-01-28 LAB — VITAMIN D 25 HYDROXY (VIT D DEFICIENCY, FRACTURES): VITD: 41.61 ng/mL (ref 30.00–100.00)

## 2018-01-28 LAB — HEMOGLOBIN A1C: HEMOGLOBIN A1C: 6.2 % (ref 4.6–6.5)

## 2018-01-28 LAB — VITAMIN B12: VITAMIN B 12: 294 pg/mL (ref 211–911)

## 2018-01-28 NOTE — Assessment & Plan Note (Signed)
Patient given injection.  Tolerated the procedure well.  Discussed icing regimen and home exercise.  Discussed which activities to do which was to avoid.  Encourage patient to possibly do bracing.  Made adjustments to patient's custom orthotics.  Follow-up again in 4 to 6 weeks

## 2018-01-28 NOTE — Patient Instructions (Signed)
We are checking the labs today.   We would like you to check the blood pressure 2-3 times per month at home to make sure it is not too high there.   If the blood pressure is consistently >140 for the top number or >90 for the bottom number it is too high and we may need to change the blood pressure medicine.   Health Maintenance, Female Adopting a healthy lifestyle and getting preventive care can go a long way to promote health and wellness. Talk with your health care provider about what schedule of regular examinations is right for you. This is a good chance for you to check in with your provider about disease prevention and staying healthy. In between checkups, there are plenty of things you can do on your own. Experts have done a lot of research about which lifestyle changes and preventive measures are most likely to keep you healthy. Ask your health care provider for more information. Weight and diet Eat a healthy diet  Be sure to include plenty of vegetables, fruits, low-fat dairy products, and lean protein.  Do not eat a lot of foods high in solid fats, added sugars, or salt.  Get regular exercise. This is one of the most important things you can do for your health. ? Most adults should exercise for at least 150 minutes each week. The exercise should increase your heart rate and make you sweat (moderate-intensity exercise). ? Most adults should also do strengthening exercises at least twice a week. This is in addition to the moderate-intensity exercise.  Maintain a healthy weight  Body mass index (BMI) is a measurement that can be used to identify possible weight problems. It estimates body fat based on height and weight. Your health care provider can help determine your BMI and help you achieve or maintain a healthy weight.  For females 30 years of age and older: ? A BMI below 18.5 is considered underweight. ? A BMI of 18.5 to 24.9 is normal. ? A BMI of 25 to 29.9 is considered  overweight. ? A BMI of 30 and above is considered obese.  Watch levels of cholesterol and blood lipids  You should start having your blood tested for lipids and cholesterol at 31 years of age, then have this test every 5 years.  You may need to have your cholesterol levels checked more often if: ? Your lipid or cholesterol levels are high. ? You are older than 31 years of age. ? You are at high risk for heart disease.  Cancer screening Lung Cancer  Lung cancer screening is recommended for adults 26-29 years old who are at high risk for lung cancer because of a history of smoking.  A yearly low-dose CT scan of the lungs is recommended for people who: ? Currently smoke. ? Have quit within the past 15 years. ? Have at least a 30-pack-year history of smoking. A pack year is smoking an average of one pack of cigarettes a day for 1 year.  Yearly screening should continue until it has been 15 years since you quit.  Yearly screening should stop if you develop a health problem that would prevent you from having lung cancer treatment.  Breast Cancer  Practice breast self-awareness. This means understanding how your breasts normally appear and feel.  It also means doing regular breast self-exams. Let your health care provider know about any changes, no matter how small.  If you are in your 20s or 30s, you should have  a clinical breast exam (CBE) by a health care provider every 1-3 years as part of a regular health exam.  If you are 34 or older, have a CBE every year. Also consider having a breast X-ray (mammogram) every year.  If you have a family history of breast cancer, talk to your health care provider about genetic screening.  If you are at high risk for breast cancer, talk to your health care provider about having an MRI and a mammogram every year.  Breast cancer gene (BRCA) assessment is recommended for women who have family members with BRCA-related cancers. BRCA-related cancers  include: ? Breast. ? Ovarian. ? Tubal. ? Peritoneal cancers.  Results of the assessment will determine the need for genetic counseling and BRCA1 and BRCA2 testing.  Cervical Cancer Your health care provider may recommend that you be screened regularly for cancer of the pelvic organs (ovaries, uterus, and vagina). This screening involves a pelvic examination, including checking for microscopic changes to the surface of your cervix (Pap test). You may be encouraged to have this screening done every 3 years, beginning at age 60.  For women ages 53-65, health care providers may recommend pelvic exams and Pap testing every 3 years, or they may recommend the Pap and pelvic exam, combined with testing for human papilloma virus (HPV), every 5 years. Some types of HPV increase your risk of cervical cancer. Testing for HPV may also be done on women of any age with unclear Pap test results.  Other health care providers may not recommend any screening for nonpregnant women who are considered low risk for pelvic cancer and who do not have symptoms. Ask your health care provider if a screening pelvic exam is right for you.  If you have had past treatment for cervical cancer or a condition that could lead to cancer, you need Pap tests and screening for cancer for at least 20 years after your treatment. If Pap tests have been discontinued, your risk factors (such as having a new sexual partner) need to be reassessed to determine if screening should resume. Some women have medical problems that increase the chance of getting cervical cancer. In these cases, your health care provider may recommend more frequent screening and Pap tests.  Colorectal Cancer  This type of cancer can be detected and often prevented.  Routine colorectal cancer screening usually begins at 31 years of age and continues through 31 years of age.  Your health care provider may recommend screening at an earlier age if you have risk factors  for colon cancer.  Your health care provider may also recommend using home test kits to check for hidden blood in the stool.  A small camera at the end of a tube can be used to examine your colon directly (sigmoidoscopy or colonoscopy). This is done to check for the earliest forms of colorectal cancer.  Routine screening usually begins at age 81.  Direct examination of the colon should be repeated every 5-10 years through 32 years of age. However, you may need to be screened more often if early forms of precancerous polyps or small growths are found.  Skin Cancer  Check your skin from head to toe regularly.  Tell your health care provider about any new moles or changes in moles, especially if there is a change in a mole's shape or color.  Also tell your health care provider if you have a mole that is larger than the size of a pencil eraser.  Always use  sunscreen. Apply sunscreen liberally and repeatedly throughout the day.  Protect yourself by wearing long sleeves, pants, a wide-brimmed hat, and sunglasses whenever you are outside.  Heart disease, diabetes, and high blood pressure  High blood pressure causes heart disease and increases the risk of stroke. High blood pressure is more likely to develop in: ? People who have blood pressure in the high end of the normal range (130-139/85-89 mm Hg). ? People who are overweight or obese. ? People who are African American.  If you are 70-74 years of age, have your blood pressure checked every 3-5 years. If you are 12 years of age or older, have your blood pressure checked every year. You should have your blood pressure measured twice-once when you are at a hospital or clinic, and once when you are not at a hospital or clinic. Record the average of the two measurements. To check your blood pressure when you are not at a hospital or clinic, you can use: ? An automated blood pressure machine at a pharmacy. ? A home blood pressure monitor.  If  you are between 27 years and 60 years old, ask your health care provider if you should take aspirin to prevent strokes.  Have regular diabetes screenings. This involves taking a blood sample to check your fasting blood sugar level. ? If you are at a normal weight and have a low risk for diabetes, have this test once every three years after 31 years of age. ? If you are overweight and have a high risk for diabetes, consider being tested at a younger age or more often. Preventing infection Hepatitis B  If you have a higher risk for hepatitis B, you should be screened for this virus. You are considered at high risk for hepatitis B if: ? You were born in a country where hepatitis B is common. Ask your health care provider which countries are considered high risk. ? Your parents were born in a high-risk country, and you have not been immunized against hepatitis B (hepatitis B vaccine). ? You have HIV or AIDS. ? You use needles to inject street drugs. ? You live with someone who has hepatitis B. ? You have had sex with someone who has hepatitis B. ? You get hemodialysis treatment. ? You take certain medicines for conditions, including cancer, organ transplantation, and autoimmune conditions.  Hepatitis C  Blood testing is recommended for: ? Everyone born from [birth year] through 1964. ? Anyone with known risk factors for hepatitis C.  Sexually transmitted infections (STIs)  You should be screened for sexually transmitted infections (STIs) including gonorrhea and chlamydia if: ? You are sexually active and are younger than 31 years of age. ? You are older than 31 years of age and your health care provider tells you that you are at risk for this type of infection. ? Your sexual activity has changed since you were last screened and you are at an increased risk for chlamydia or gonorrhea. Ask your health care provider if you are at risk.  If you do not have HIV, but are at risk, it may be recommended  that you take a prescription medicine daily to prevent HIV infection. This is called pre-exposure prophylaxis (PrEP). You are considered at risk if: ? You are sexually active and do not regularly use condoms or know the HIV status of your partner(s). ? You take drugs by injection. ? You are sexually active with a partner who has HIV.  Talk with your health  care provider about whether you are at high risk of being infected with HIV. If you choose to begin PrEP, you should first be tested for HIV. You should then be tested every 3 months for as long as you are taking PrEP. Pregnancy  If you are premenopausal and you may become pregnant, ask your health care provider about preconception counseling.  If you may become pregnant, take 400 to 800 micrograms (mcg) of folic acid every day.  If you want to prevent pregnancy, talk to your health care provider about birth control (contraception). Osteoporosis and menopause  Osteoporosis is a disease in which the bones lose minerals and strength with aging. This can result in serious bone fractures. Your risk for osteoporosis can be identified using a bone density scan.  If you are 86 years of age or older, or if you are at risk for osteoporosis and fractures, ask your health care provider if you should be screened.  Ask your health care provider whether you should take a calcium or vitamin D supplement to lower your risk for osteoporosis.  Menopause may have certain physical symptoms and risks.  Hormone replacement therapy may reduce some of these symptoms and risks. Talk to your health care provider about whether hormone replacement therapy is right for you. Follow these instructions at home:  Schedule regular health, dental, and eye exams.  Stay current with your immunizations.  Do not use any tobacco products including cigarettes, chewing tobacco, or electronic cigarettes.  If you are pregnant, do not drink alcohol.  If you are  breastfeeding, limit how much and how often you drink alcohol.  Limit alcohol intake to no more than 1 drink per day for nonpregnant women. One drink equals 12 ounces of beer, 5 ounces of wine, or 1 ounces of hard liquor.  Do not use street drugs.  Do not share needles.  Ask your health care provider for help if you need support or information about quitting drugs.  Tell your health care provider if you often feel depressed.  Tell your health care provider if you have ever been abused or do not feel safe at home. This information is not intended to replace advice given to you by your health care provider. Make sure you discuss any questions you have with your health care provider. Document Released: 11/26/2010 Document Revised: 10/19/2015 Document Reviewed: 02/14/2015 Elsevier Interactive Patient Education  [employer].

## 2018-01-28 NOTE — Assessment & Plan Note (Signed)
Stable.  Patient has gained weight which I think is contributing.  We discussed icing regimen and home exercise.  Responded well to osteopathic manipulation.  Follow-up again in 4 to 6 weeks

## 2018-01-28 NOTE — Patient Instructions (Signed)
Good to see you  Deanna Richards is your friend.  Stay active You know what to do  See me again in 6-12 weeks for manipulation

## 2018-01-28 NOTE — Progress Notes (Signed)
[facility] [address] [place], [place] [address] Phone: [phone] Subjective:     I Deanna Richards am serving as a Neurosurgeon for Dr. Antoine Primas.  CC: left ankle pain   [id number]  Deanna Richards is a 32 y.o. female coming in with complaint of left ankle pain. Ankle isn't doing well today. Wants to know if she needs an injection. Also here for back adjustment.  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  Feels like this is starting to affect again.  Tighter in the mornings, affecting daily activities.  Sometimes so stiff he cannot walk for minutes  Back has shown good results with osteopathic manipulation.  X-ray is normal.     Past Medical History:  Diagnosis Date  . 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 PCOS (polycystic ovarian syndrome)    no current med.   Past Surgical History:  Procedure Laterality Date  . CARPAL TUNNEL RELEASE Right 11/05/2013   Procedure: CARPAL TUNNEL RELEASE ENDOSCOPIC RIGHT WRIST;  Surgeon: Sheral Apley, MD;  Location: [facility];  Service: Orthopedics;  Laterality: Right;  . NO PAST SURGERIES     Social History   Socioeconomic History  . Marital status: Single    Spouse name: Not on file  . Number of children: Not on file  . Years of education: Not on file  . Highest education level: Not on file  Occupational History  . Not on file  Social Needs  . Financial resource strain: Not on file  . Food insecurity:    Worry: Not on file    Inability: Not on file  . Transportation needs:    Medical: Not on file    Non-medical: Not on file  Tobacco Use  . Smoking status: Current Every Day Smoker    Years: 5.00    Types: Cigars  . Smokeless tobacco: Never Used  . Tobacco comment: 1-2 Black and Mild/day  Substance and Sexual Activity  . Alcohol use: Yes    Alcohol/week: 0.0 standard drinks    Comment: weekends  . Drug use: No  . Sexual activity: Yes   Birth control/protection: None, Pill  Lifestyle  . Physical activity:    Days per week: Not on file    Minutes per session: Not on file  . Stress: Not on file  Relationships  . Social connections:    Talks on phone: Not on file    Gets together: Not on file    Attends religious service: Not on file    Active member of club or organization: Not on file    Attends meetings of clubs or organizations: Not on file    Relationship status: Not on file  Other Topics Concern  . Not on file  Social History Narrative  . Not on file   No Known Allergies Family History  Adopted: Yes  Problem Relation Age of Onset  . Hypertension Mother      Current Outpatient Medications (Cardiovascular):  .  hydrochlorothiazide (HYDRODIURIL) 25 MG tablet, Take 1 tablet (25 mg total) by mouth daily.  Current Outpatient Medications (Respiratory):  .  albuterol (PROVENTIL HFA;VENTOLIN HFA) 108 (90 Base) MCG/ACT inhaler, Inhale 2 puffs into the lungs every 6 (six) hours as needed for wheezing or shortness of breath. .  fluticasone (FLONASE) 50 MCG/ACT nasal spray, Place 2 sprays into both nostrils daily.   Current Outpatient Medications (Hematological):  .  folic acid (FOLVITE) 1 MG  tablet, Take 1 mg by mouth daily.  Current Outpatient Medications (Other):  Marland Kitchen  Vitamin D, Ergocalciferol, (DRISDOL) 50000 units CAPS capsule, Take 1 capsule (50,000 Units total) by mouth every 7 (seven) days.    Past medical history, social, surgical and family history all reviewed in electronic medical record.  No pertanent information unless stated regarding to the chief complaint.   Review of Systems:  No headache, visual changes, nausea, vomiting, diarrhea, constipation, dizziness, abdominal pain, skin rash, fevers, chills, night sweats, weight loss, swollen lymph nodes, body aches, joint swelling, chest pain, shortness of breath, mood changes.  Positive muscle aches  Objective  Blood pressure (!) 160/90, pulse 98,  height 5\' 2"  (1.575 m), weight 253 lb (114.8 kg), SpO2 97 %.    General: No apparent distress alert and oriented x3 mood and affect normal, dressed appropriately.  HEENT: Pupils equal, extraocular movements intact  Respiratory: Patient's speak in full sentences and does not appear short of breath  Cardiovascular: No lower extremity edema, non tender, no erythema  Skin: Warm dry intact with no signs of infection or rash on extremities or on axial skeleton.  Abdomen: Soft nontender  Neuro: Cranial nerves II through XII are intact, neurovascularly intact in all extremities with 2+ DTRs and 2+ pulses.  Lymph: No lymphadenopathy of posterior or anterior cervical chain or axillae bilaterally.  Gait normal with good balance and coordination.  MSK:  Non tender with full range of motion and good stability and symmetric strength and tone of shoulders, elbows, wrist, hip, knee bilaterally.  Left ankle exam shows the patient does have some tenderness to palpation over the posterior tibialis tendon.  Patient does have some mild over pronation of the hindfoot.  Some mild tightness of the posterior cord noted.  Neurovascular intact distally.  Nearly full range of motion otherwise.  Back exam shows some mild loss of lordosis.  Tender to palpation in the thoracolumbar as well as the lumbosacral area bilaterally.  Osteopathic findings  C2 flexed rotated and side bent right C6 flexed rotated and side bent left T5 extended rotated and side bent right inhaled rib L5 flexed rotated and side bent right Sacrum right on right  Procedure: Real-time Ultrasound Guided Injection of left posterior tibialis tendon sheath Device: GE Logiq Q7 Ultrasound guided injection is preferred based studies that show increased duration, increased effect, greater accuracy, decreased procedural pain, increased response rate, and decreased cost with ultrasound guided versus blind injection.  Verbal informed consent obtained.    Time-out conducted.  Noted no overlying erythema, induration, or other signs of local infection.  Skin prepped in a sterile fashion.  Local anesthesia: Topical Ethyl chloride.  With sterile technique and under real time ultrasound guidance: 25-gauge needle was injected with 0.5 cc of 0.5% Marcaine and 0.5 cc of Kenalog 40 mg/mL Completed without difficulty  Pain immediately resolved suggesting accurate placement of the medication.  Advised to call if fevers/chills, erythema, induration, drainage, or persistent bleeding.  Images permanently stored and available for review in the ultrasound unit.  Impression: Technically successful ultrasound guided injection.   Impression and Recommendations:     This case required medical decision making of moderate complexity. The above documentation has been reviewed and is accurate and complete Judi Saa, DO       Note: This dictation was prepared with Dragon dictation along with smaller phrase technology. Any transcriptional errors that result from this process are unintentional.

## 2018-01-28 NOTE — Assessment & Plan Note (Signed)
decision today to treat with OMT was based on Physical Exam  After verbal consent patient was treated with HVLA, ME, FPR techniques in cervical, thoracic, lumbar and sacral areas  Patient tolerated the procedure well with improvement in symptoms  Patient given exercises, stretches and lifestyle modifications  See medications in patient instructions if given  Patient will follow up in 4-6weeks

## 2018-01-28 NOTE — Progress Notes (Signed)
   Subjective:    Patient ID: Deanna Richards, female    DOB: [birth year], 32 y.o.   MRN: [id number]  HPI The patient is a 32 YO female coming in for physical. Denies new concerns.   PMH, [facility], social history reviewed and updated.   Review of Systems  Constitutional: Negative.   HENT: Negative.   Eyes: Negative.   Respiratory: Negative for cough, chest tightness and shortness of breath.   Cardiovascular: Negative for chest pain, palpitations and leg swelling.  Gastrointestinal: Negative for abdominal distention, abdominal pain, constipation, diarrhea, nausea and vomiting.  Musculoskeletal: Negative.   Skin: Negative.   Neurological: Negative.   Psychiatric/Behavioral: Negative.       Objective:   Physical Exam  Constitutional: She is oriented to person, place, and time. She appears well-developed and well-nourished.  HENT:  Head: Normocephalic and atraumatic.  Eyes: EOM are normal.  Neck: Normal range of motion.  Cardiovascular: Normal rate and regular rhythm.  Pulmonary/Chest: Effort normal and breath sounds normal. No respiratory distress. She has no wheezes. She has no rales.  Abdominal: Soft. Bowel sounds are normal. She exhibits no distension. There is no tenderness. There is no rebound.  Musculoskeletal: She exhibits no edema.  Neurological: She is alert and oriented to person, place, and time. Coordination normal.  Skin: Skin is warm and dry.  Psychiatric: She has a normal mood and affect.   Vitals:   01/28/18 1017 01/28/18 1043  BP: (!) 170/100 (!) 160/100  Pulse: 90   Temp: 98.2 F (36.8 C)   TempSrc: Oral   SpO2: 97%   Weight: 253 lb (114.8 kg)   Height: 5\' 2"  (1.575 m)       Assessment & Plan:

## 2018-01-29 ENCOUNTER — Encounter: Payer: 59 | Admitting: Internal Medicine

## 2018-01-29 ENCOUNTER — Encounter: Payer: Self-pay | Admitting: Internal Medicine

## 2018-01-29 NOTE — Assessment & Plan Note (Signed)
She is reminded of the need to work on weight loss.

## 2018-01-29 NOTE — Assessment & Plan Note (Signed)
Flu and tetanus declined. Declines HIV screening. Pap smear with gyn and up to date. Counseled about sun safety and dangers of distracted driving. Given screening recommendations.

## 2018-01-29 NOTE — Assessment & Plan Note (Signed)
BP not at goal and she declines change today. Taking hctz 25 mg daily.

## 2018-02-09 DIAGNOSIS — M9903 Segmental and somatic dysfunction of lumbar region: Secondary | ICD-10-CM | POA: Diagnosis not present

## 2018-02-09 DIAGNOSIS — M5386 Other specified dorsopathies, lumbar region: Secondary | ICD-10-CM | POA: Diagnosis not present

## 2018-02-09 DIAGNOSIS — M9905 Segmental and somatic dysfunction of pelvic region: Secondary | ICD-10-CM | POA: Diagnosis not present

## 2018-02-11 DIAGNOSIS — M5386 Other specified dorsopathies, lumbar region: Secondary | ICD-10-CM | POA: Diagnosis not present

## 2018-02-11 DIAGNOSIS — M9903 Segmental and somatic dysfunction of lumbar region: Secondary | ICD-10-CM | POA: Diagnosis not present

## 2018-02-11 DIAGNOSIS — M9905 Segmental and somatic dysfunction of pelvic region: Secondary | ICD-10-CM | POA: Diagnosis not present

## 2018-02-12 DIAGNOSIS — Z3200 Encounter for pregnancy test, result unknown: Secondary | ICD-10-CM | POA: Diagnosis not present

## 2018-02-12 DIAGNOSIS — N939 Abnormal uterine and vaginal bleeding, unspecified: Secondary | ICD-10-CM | POA: Diagnosis not present

## 2018-02-12 DIAGNOSIS — Z118 Encounter for screening for other infectious and parasitic diseases: Secondary | ICD-10-CM | POA: Diagnosis not present

## 2018-02-15 ENCOUNTER — Encounter: Payer: Self-pay | Admitting: Internal Medicine

## 2018-02-16 DIAGNOSIS — M5386 Other specified dorsopathies, lumbar region: Secondary | ICD-10-CM | POA: Diagnosis not present

## 2018-02-16 DIAGNOSIS — M9903 Segmental and somatic dysfunction of lumbar region: Secondary | ICD-10-CM | POA: Diagnosis not present

## 2018-02-16 DIAGNOSIS — M9905 Segmental and somatic dysfunction of pelvic region: Secondary | ICD-10-CM | POA: Diagnosis not present

## 2018-02-16 NOTE — Telephone Encounter (Signed)
She needs more medication - she should be seen to discuss options  - I can see her tomorrow.

## 2018-02-23 IMAGING — DX DG LUMBAR SPINE COMPLETE 4+V
5 series · 5 of 5 positions shown · non-contrast
Comparison: 08/22/2014

CLINICAL DATA: Low back pain and pressure for 6 months.

EXAM:
LUMBAR SPINE - COMPLETE 4+ VIEW

[l-spine ap]
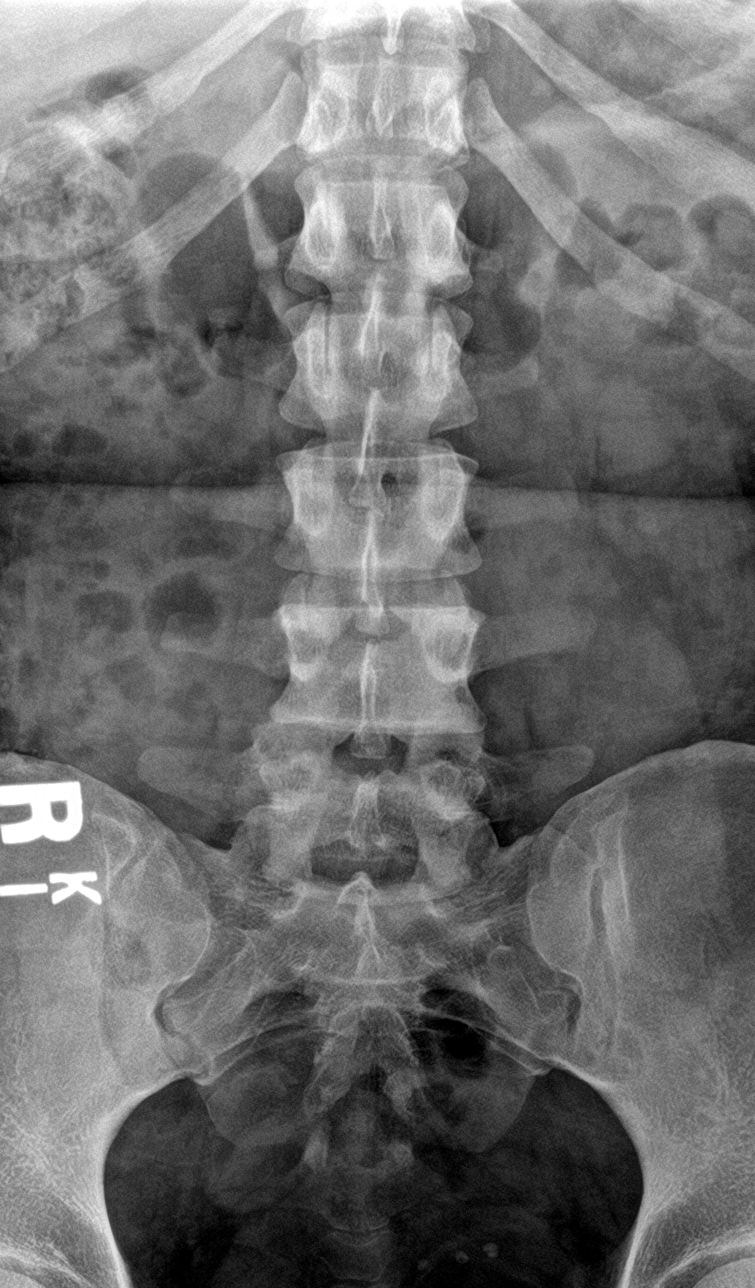

[l-spine obl (1 of 2)]
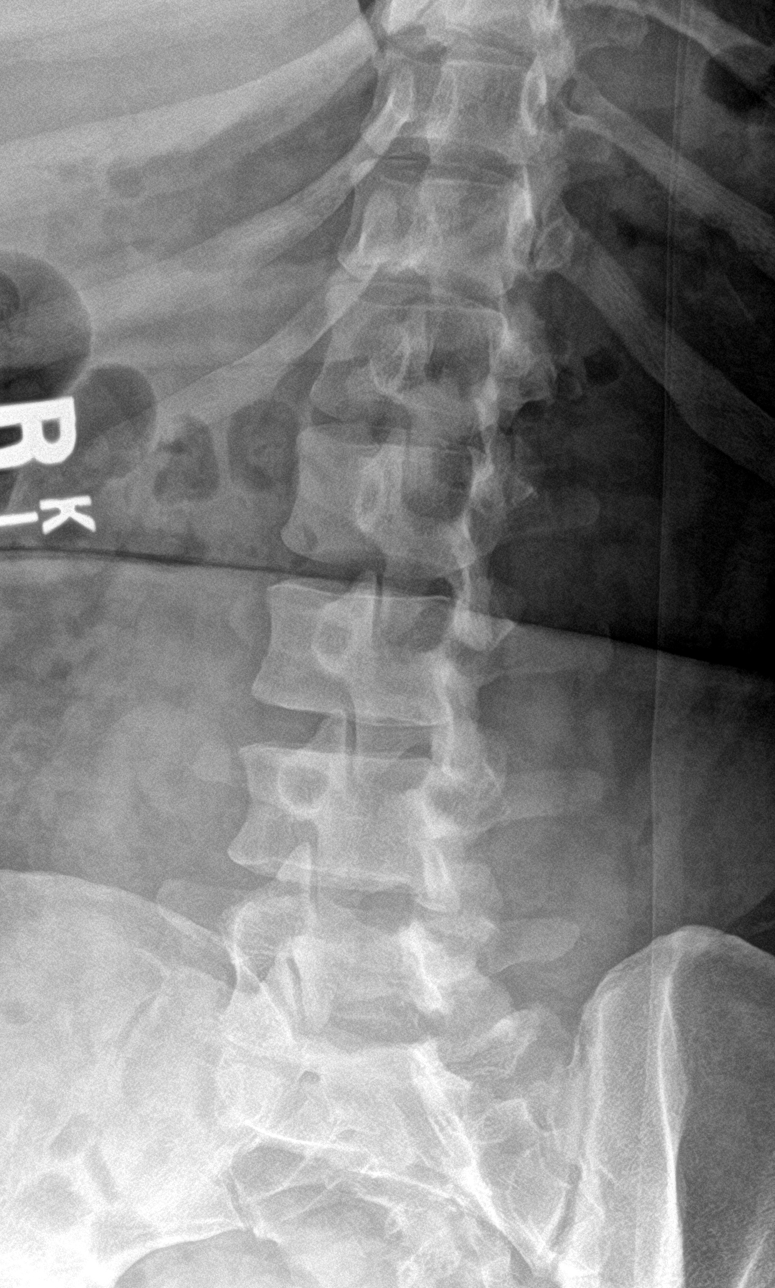

[l-spine obl (2 of 2)]
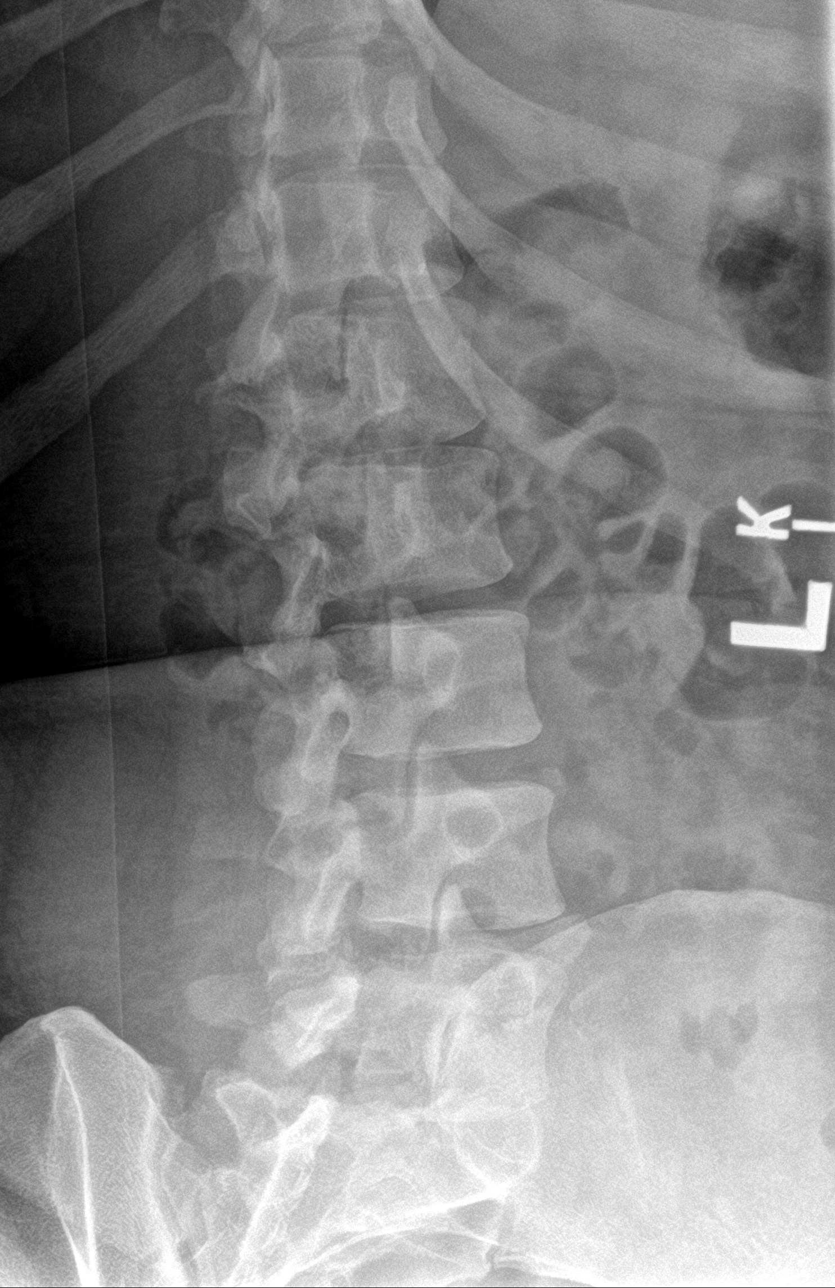

[l-spine lat]
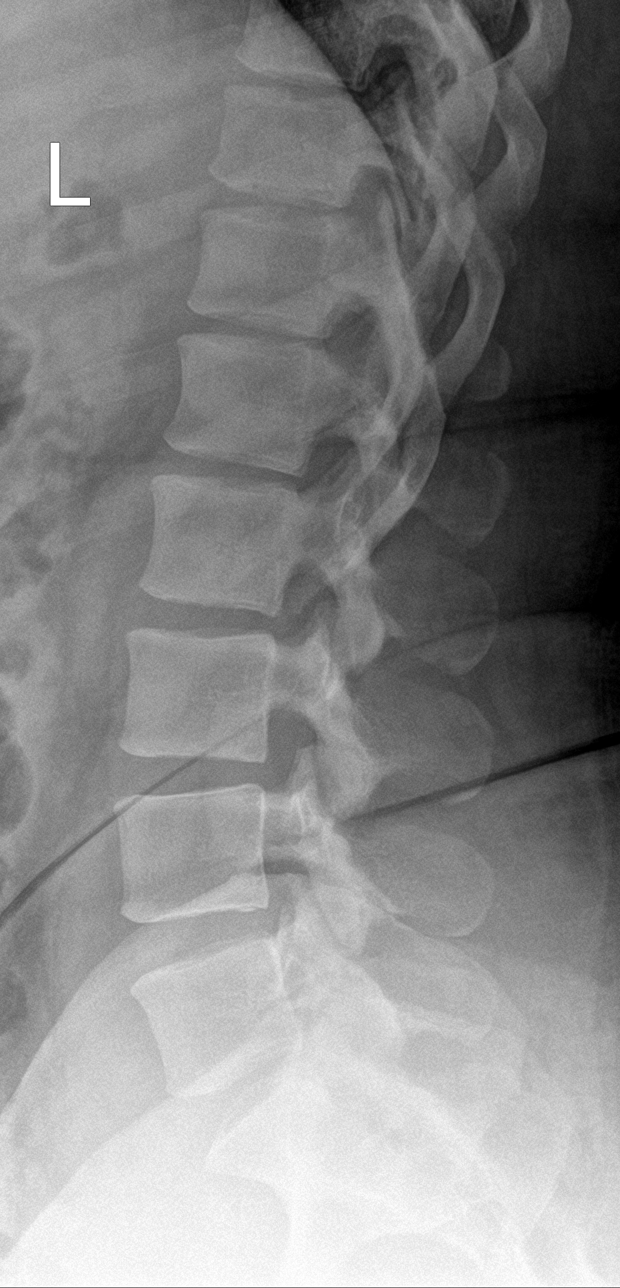

[l-spine spot]
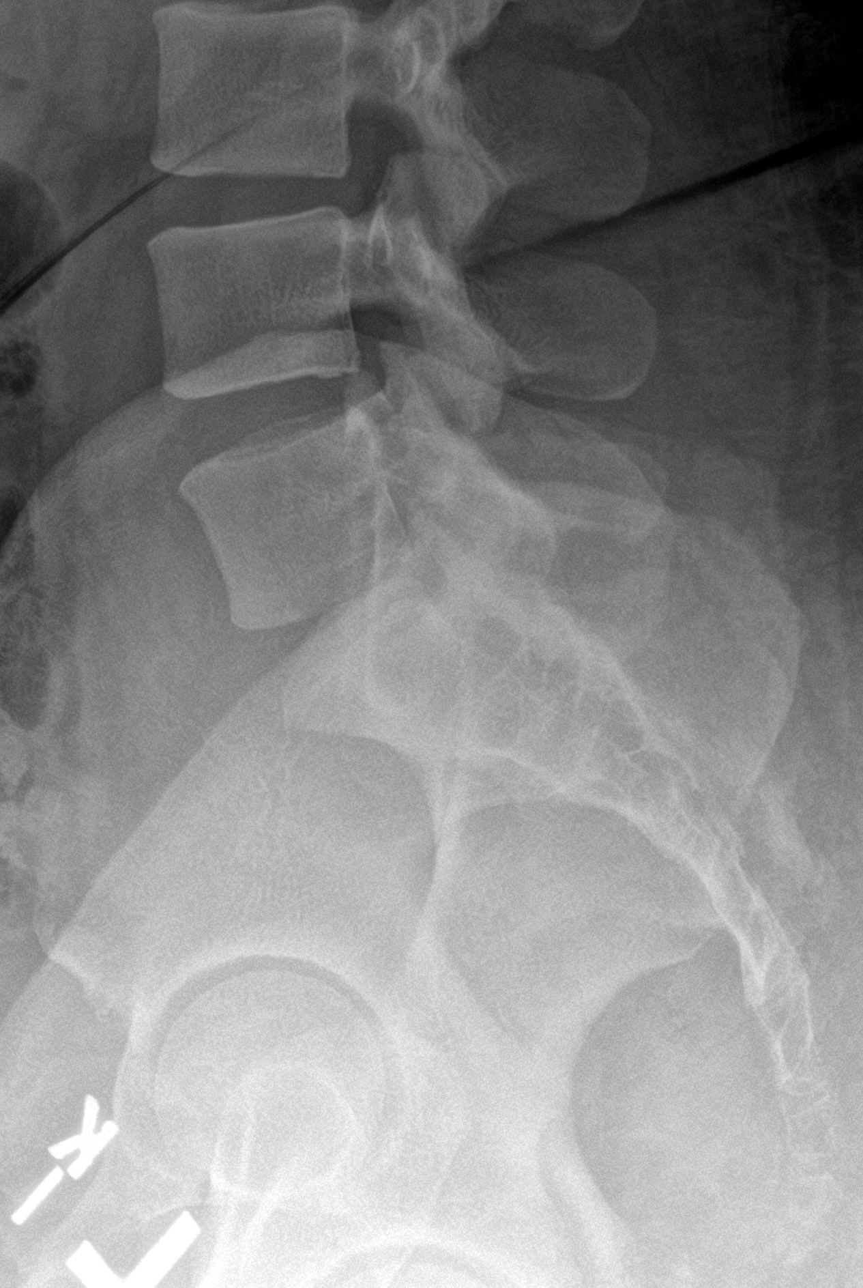

[5 of 5 positions shown; findings below may reference images not displayed]

FINDINGS: There is no evidence of lumbar spine fracture. Alignment is normal.
Intervertebral disc spaces are maintained.
IMPRESSION: Negative.

## 2018-02-27 DIAGNOSIS — M9905 Segmental and somatic dysfunction of pelvic region: Secondary | ICD-10-CM | POA: Diagnosis not present

## 2018-02-27 DIAGNOSIS — M9903 Segmental and somatic dysfunction of lumbar region: Secondary | ICD-10-CM | POA: Diagnosis not present

## 2018-02-27 DIAGNOSIS — M5386 Other specified dorsopathies, lumbar region: Secondary | ICD-10-CM | POA: Diagnosis not present

## 2018-02-28 ENCOUNTER — Other Ambulatory Visit: Payer: Self-pay | Admitting: Family Medicine

## 2018-03-02 ENCOUNTER — Encounter: Payer: Self-pay | Admitting: Internal Medicine

## 2018-03-02 ENCOUNTER — Encounter: Payer: Self-pay | Admitting: Family Medicine

## 2018-03-02 MED ORDER — LOSARTAN POTASSIUM-HCTZ 100-25 MG PO TABS: 1.0000 | ORAL_TABLET | Freq: Every day | ORAL | 3 refills | Status: DC

## 2018-03-02 MED ORDER — VITAMIN D (ERGOCALCIFEROL) 1.25 MG (50000 UNIT) PO CAPS: 50000.0000 [IU] | ORAL_CAPSULE | ORAL | 0 refills | Status: DC

## 2018-03-20 DIAGNOSIS — N920 Excessive and frequent menstruation with regular cycle: Secondary | ICD-10-CM | POA: Diagnosis not present

## 2018-03-31 ENCOUNTER — Ambulatory Visit: Payer: 59 | Admitting: Sports Medicine

## 2018-03-31 ENCOUNTER — Ambulatory Visit: Payer: Self-pay

## 2018-03-31 ENCOUNTER — Encounter: Payer: Self-pay | Admitting: Sports Medicine

## 2018-03-31 VITALS — BP 150/100 | HR 95 | Ht 62.0 in | Wt 252.6 lb

## 2018-03-31 DIAGNOSIS — M25571 Pain in right ankle and joints of right foot: Secondary | ICD-10-CM | POA: Diagnosis not present

## 2018-03-31 NOTE — Progress Notes (Signed)
[place]. [facility] at [facility] [phone]  Deanna Richards - 32 y.o. female MRN [id number]  Date of birth: November 25, 1985  Visit Date: 03/31/2018  PCP: Myrlene Broker, MD   Referred by: Myrlene Broker, *   Scribe(s) for today's visit: Stevenson Clinch, CMA  SUBJECTIVE:  Deanna Richards is here for R ankle pain   HPI: 03/31/2018: Her R ankle pain symptoms INITIALLY: Began this past Saturday and started after jumping from a food truck.  Described as moderate discomfort, nonradiating Worsened with weightbearing, heel strike.  Improved with rest.  Additional associated symptoms include: Pain was constant but is not intermittent. No visible swelling. Pain is lateral.     At this time symptoms are improving compared to onset. She has been soaking in [facility] bath with minimal relief.   REVIEW OF SYSTEMS: Denies night time disturbances. Denies fevers, chills, or night sweats. Denies unexplained weight loss. Denies personal history of cancer. Denies changes in bowel or bladder habits. Denies recent unreported falls. Denies new or worsening dyspnea or wheezing. Denies headaches or dizziness.  Denies numbness, tingling or weakness in the extremities.  Denies dizziness or presyncopal episodes Denies lower extremity edema    HISTORY:  Prior history reviewed and updated per electronic medical record.  Social History   Occupational History  . Not on file  Tobacco Use  . Smoking status: Current Every Day Smoker    Years: 5.00    Types: Cigars  . Smokeless tobacco: Never Used  . Tobacco comment: 1-2 Black and Mild/day  Substance and Sexual Activity  . Alcohol use: Yes    Alcohol/week: 0.0 standard drinks    Comment: weekends  . Drug use: No  . Sexual activity: Yes    Birth control/protection: None, Pill   Social History   Social History Narrative  . Not on file     DATA OBTAINED & REVIEWED:    Recent Labs    01/28/18 1046  HGBA1C 6.2   Problem  Acute Right Ankle Pain   .   OBJECTIVE:  VS:  HT:5\' 2"  (157.5 cm)   WT:252 lb 9.6 oz (114.6 kg)  BMI:46.19    BP:(!) 150/100  HR:95bpm  TEMP: ( )  RESP:97 %   PHYSICAL EXAM: CONSTITUTIONAL: Well-developed, Well-nourished and In no acute distress PSYCHIATRIC: Alert & appropriately interactive. and Not depressed or anxious appearing. RESPIRATORY: No increased work of breathing and Trachea Midline EYES: Pupils are equal., EOM intact without nystagmus. and No scleral icterus.  VASCULAR EXAM: Warm and well perfused NEURO: unremarkable  MSK Exam: Right ankle  Large soft tissue envelope.  Moderate flatfoot. No overlying skin changes. Generalized TTP over the lateral ankle.  Anterior lateral recess is most focally tender.  Ligamentously stable.  Mild pain with palpation over the peroneal tendons.   RANGE OF MOTION & STRENGTH  Limited dorsiflexion to 95 degrees on the right.   SPECIALITY TESTING:  Stable ankle drawer testing.  Negative talar tilt.  Klieger testing and cotton testing     ASSESSMENT   1. Acute right ankle pain     PLAN:  Pertinent additional documentation may be included in corresponding procedure notes, imaging studies, problem based documentation and patient instructions.  Procedures:  . US Guided Injection per procedure note  Medications:  No orders of the defined types were placed in this encounter.  Discussion/Instructions: Acute right ankle pain  consistent with ankle impingement. Injection performed today.  Body Helix Compression Sleeve compression sleeve discussed her bilateral ankles.  Could consider custom insoles.  . Cool water soaking recommended. . Discussed red flag symptoms that warrant earlier emergent evaluation and patient voices understanding. . Activity modifications and the importance of avoiding exacerbating activities (limiting pain to no more than a 4 / 10 during or  following activity) recommended and discussed.  Follow-up:  . Return in about 6 weeks (around 05/12/2018).  . At follow up will plan to consider: Further diagnostic evaluation with x-rays and/or repeat MSK ultrasound.     CMA/ATC served as Neurosurgeon during this visit. History, Physical, and Plan performed by medical provider. Documentation and orders reviewed and attested to.      Andrena Mews, DO    [place] Physician

## 2018-03-31 NOTE — Procedures (Signed)
PROCEDURE NOTE:  Ultrasound Guided: Injection: Right ankle Images were obtained and interpreted by myself, Gaspar Bidding, DO  Images have been saved and stored to PACS system. Images obtained on: GE S7 Ultrasound machine    ULTRASOUND FINDINGS:  Small ankle effusion with minimal fragmentation of the anterior lateral ankle.  Mild degenerative changes.  Peroneal tendons with minimal hypoechoic change but generalized thickening.  DESCRIPTION OF PROCEDURE:  The patient's clinical condition is marked by substantial pain and/or significant functional disability. Other conservative therapy has not provided relief, is contraindicated, or not appropriate. There is a reasonable likelihood that injection will significantly improve the patient's pain and/or functional impairment.   After discussing the risks, benefits and expected outcomes of the injection and all questions were reviewed and answered, the patient wished to undergo the above named procedure.  Verbal consent was obtained.  The ultrasound was used to identify the target structure and adjacent neurovascular structures. The skin was then prepped in sterile fashion and the target structure was injected under direct visualization using sterile technique as below:  Single injection performed as below: PREP: Alcohol and Ethel Chloride APPROACH:anteriolateral, single injection, 25g 1.5 in. INJECTATE: 1 cc 0.5% Marcaine and 1 cc 40mg /mL DepoMedrol ASPIRATE: None DRESSING: Band-Aid  Post procedural instructions including recommending icing and warning signs for infection were reviewed.    This procedure was well tolerated and there were no complications.   IMPRESSION: Succesful Ultrasound Guided: Injection

## 2018-03-31 NOTE — Patient Instructions (Addendum)
You had an injection today.  Things to be aware of after injection are listed below: . You may experience no significant improvement or even a slight worsening in your symptoms during the first 24 to 48 hours.  After that we expect your symptoms to improve gradually over the next 2 weeks for the medicine to have its maximal effect.  You should continue to have improvement out to 6 weeks after your injection. . Dr. Berline Chough recommends icing the site of the injection for 20 minutes  1-2 times the day of your injection . You may shower but no swimming, tub bath or Jacuzzi for 24 hours. . If your bandage falls off this does not need to be replaced.  It is appropriate to remove the bandage after 4 hours. . You may resume light activities as tolerated unless otherwise directed per Dr. Berline Chough during your visit  POSSIBLE STEROID SIDE EFFECTS:  Side effects from injectable steroids tend to be less than when taken orally however you may experience some of the symptoms listed below.  If experienced these should only last for a short period of time. Change in menstrual flow  Edema (swelling)  Increased appetite Skin flushing (redness)  Skin rash/acne  Thrush (oral) Yeast vaginitis    Increased sweating  Depression Increased blood glucose levels Cramping and leg/calf  Euphoria (feeling happy)  POSSIBLE PROCEDURE SIDE EFFECTS: The side effects of the injection are usually fairly minimal however if you may experience some of the following side effects that are usually self-limited and will is off on their own.  If you are concerned please feel free to call the office with questions:  Increased numbness or tingling  Nausea or vomiting  Swelling or bruising at the injection site   Please call our office if if you experience any of the following symptoms over the next week as these can be signs of infection:   Fever greater than 100.61F  Significant swelling at the injection site  Significant redness or drainage  from the injection site  If after 2 weeks you are continuing to have worsening symptoms please call our office to discuss what the next appropriate actions should be including the potential for a return office visit or other diagnostic testing.     I recommend you obtained a compression sleeve to help with your joint problems. There are many options on the market however I recommend obtaining a Full Ankle Body Helix compression sleeve.  You can find information (including how to appropriate measure yourself for sizing) can be found at [email or web address].ch.  Many of these products are health savings account (HSA) eligible.   You can use the compression sleeve at any time throughout the day but is most important to use while being active as well as for 2 hours post-activity.   It is appropriate to ice following activity with the compression sleeve in place.

## 2018-03-31 NOTE — Assessment & Plan Note (Signed)
consistent with ankle impingement. Injection performed today.  Body Helix Compression Sleeve compression sleeve discussed her bilateral ankles.  Could consider custom insoles.

## 2018-04-10 DIAGNOSIS — M25531 Pain in right wrist: Secondary | ICD-10-CM | POA: Diagnosis not present

## 2018-06-19 DIAGNOSIS — M9903 Segmental and somatic dysfunction of lumbar region: Secondary | ICD-10-CM | POA: Diagnosis not present

## 2018-06-19 DIAGNOSIS — M9905 Segmental and somatic dysfunction of pelvic region: Secondary | ICD-10-CM | POA: Diagnosis not present

## 2018-06-19 DIAGNOSIS — M5386 Other specified dorsopathies, lumbar region: Secondary | ICD-10-CM | POA: Diagnosis not present

## 2018-06-30 ENCOUNTER — Encounter: Payer: Self-pay | Admitting: Family Medicine

## 2018-07-07 ENCOUNTER — Telehealth: Payer: Self-pay

## 2018-07-07 NOTE — Progress Notes (Signed)
[facility] [address] [place], [place] [address] Phone: [phone] Subjective:     I Deanna Richards am serving as a Neurosurgeon for Dr. Antoine Primas.   I'm seeing this patient by the request  of:    CC:   [id number]     Updated 07/08/2018  Deanna Richards is a 33 y.o. female coming in with complaint of left ankle pain. Left ankle injection. Patient has been seen previously.  Had more posterior tibialis tendinitis.  Has had it both sides.  Last time was seen by another provider and given a right-sided injection.  Now having worsening pain again on the left.  This was nearly 6 months ago.  Increasing swelling.  Being fitted for orthotics as well.     Past Medical History:  Diagnosis Date  . 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 PCOS (polycystic ovarian syndrome)    no current med.   Past Surgical History:  Procedure Laterality Date  . CARPAL TUNNEL RELEASE Right 11/05/2013   Procedure: CARPAL TUNNEL RELEASE ENDOSCOPIC RIGHT WRIST;  Surgeon: Sheral Apley, MD;  Location: [facility];  Service: Orthopedics;  Laterality: Right;  . NO PAST SURGERIES     Social History   Socioeconomic History  . Marital status: Single    Spouse name: Not on file  . Number of children: Not on file  . Years of education: Not on file  . Highest education level: Not on file  Occupational History  . Not on file  Social Needs  . Financial resource strain: Not on file  . Food insecurity:    Worry: Not on file    Inability: Not on file  . Transportation needs:    Medical: Not on file    Non-medical: Not on file  Tobacco Use  . Smoking status: Current Every Day Smoker    Years: 5.00    Types: Cigars  . Smokeless tobacco: Never Used  . Tobacco comment: 1-2 Black and Mild/day  Substance and Sexual Activity  . Alcohol use: Yes    Alcohol/week: 0.0 standard drinks    Comment: weekends  . Drug use: No  . Sexual activity: Yes   Birth control/protection: None, Pill  Lifestyle  . Physical activity:    Days per week: Not on file    Minutes per session: Not on file  . Stress: Not on file  Relationships  . Social connections:    Talks on phone: Not on file    Gets together: Not on file    Attends religious service: Not on file    Active member of club or organization: Not on file    Attends meetings of clubs or organizations: Not on file    Relationship status: Not on file  Other Topics Concern  . Not on file  Social History Narrative  . Not on file   No Known Allergies Family History  Adopted: Yes  Problem Relation Age of Onset  . Hypertension Mother      Current Outpatient Medications (Cardiovascular):  .  losartan-hydrochlorothiazide (HYZAAR) 100-25 MG tablet, Take 1 tablet by mouth daily.  Current Outpatient Medications (Respiratory):  .  albuterol (PROVENTIL HFA;VENTOLIN HFA) 108 (90 Base) MCG/ACT inhaler, Inhale 2 puffs into the lungs every 6 (six) hours as needed for wheezing or shortness of breath. .  fluticasone (FLONASE) 50 MCG/ACT nasal spray, Place 2 sprays into both nostrils daily.   Current Outpatient Medications (Hematological):  Marland Kitchen  folic acid (FOLVITE) 1 MG tablet, Take 1 mg by mouth daily.  Current Outpatient Medications (Other):  Marland Kitchen  Vitamin D, Ergocalciferol, (DRISDOL) 50000 units CAPS capsule, Take 1 capsule (50,000 Units total) by mouth every 7 (seven) days.    Past medical history, social, surgical and family history all reviewed in electronic medical record.  No pertanent information unless stated regarding to the chief complaint.   Review of Systems:  No headache, visual changes, nausea, vomiting, diarrhea, constipation, dizziness, abdominal pain, skin rash, fevers, chills, night sweats, weight loss, swollen lymph nodes, body aches, joint swelling, muscle aches, chest pain, shortness of breath, mood changes.   Objective  There were no vitals taken for this visit. Systems  examined below as of    General: No apparent distress alert and oriented x3 mood and affect normal, dressed appropriately.  HEENT: Pupils equal, extraocular movements intact  Respiratory: Patient's speak in full sentences and does not appear short of breath  Cardiovascular: No lower extremity edema, non tender, no erythema  Skin: Warm dry intact with no signs of infection or rash on extremities or on axial skeleton.  Abdomen: Soft nontender  Neuro: Cranial nerves II through XII are intact, neurovascularly intact in all extremities with 2+ DTRs and 2+ pulses.  Lymph: No lymphadenopathy of posterior or anterior cervical chain or axillae bilaterally.  Gait antalgic MSK:  Non tender with full range of motion and good stability and symmetric strength and tone of shoulders, elbows, wrist, hip, knee bilaterally.  Ankle exam shows the patient's hindfoot has significant overpronation.  Patient severely tender to palpation on the left posterior tibialis tendon and does have some swelling noted in the area.  Near full range of motion.  With standing the patient has some difficulty with supination of the foot when going up on her toes.  Good strength though passively decent actively.  Neurovascular intact distally.  Procedure: Real-time UInjection of left posterior tibialis tendinitis .  Verbal informed consent obtained.  Time-out conducted.  Noted no overlying erythema, induration, or other signs of local infection.  Skin prepped in a sterile fashion.  Local anesthesia: Topical Ethyl chloride.  With sterile technique and under real time ultrasound guidance: 5 gauge half inch needle patient was injected with 0.5 cc of 0.5% Marcaine and 0.5 cc of Kenalog 40 mg/mL Completed without difficulty  Pain immediately resolved suggesting accurate placement of the medication.  Advised to call if fevers/chills, erythema, induration, drainage, or persistent bleeding.   Impression: Technically successful ultrasound  guided injection.    Procedure Note   Patient was fitted for a : standard, cushioned, semi-rigid orthotic. The orthotic was heated and afterward the patient patient seated position and molded The patient was positioned in subtalar neutral position and 10 degrees of ankle dorsiflexion in a weight bearing stance. After completion of molding, patient did have orthotic management The blank was ground to a stable position for weight bearing. Size: 9 Base: Carbon fiber Additional Posting and Padding: Left medial 300/110, 300/120 lateral 300/120 transverse 250/35 Right medial 300/110, 300/120 lateral 300/140 transverse 250/35 The patient ambulated these, and they were very comfortable.     Impression and Recommendations:     This case required medical decision making of moderate complexity. The above documentation has been reviewed and is accurate and complete Judi Saa, DO       Note: This dictation was prepared with Dragon dictation along with smaller phrase technology. Any transcriptional errors that result from this process are unintentional.

## 2018-07-07 NOTE — Telephone Encounter (Signed)
Patient is scheduled tomorrow for orthotics and ankle injection.

## 2018-07-07 NOTE — Telephone Encounter (Signed)
Talked to patient on 07/02/2018 about orthotics and ankle injection. Will schedule both at the same time.

## 2018-07-08 ENCOUNTER — Encounter: Payer: Self-pay | Admitting: Family Medicine

## 2018-07-08 ENCOUNTER — Ambulatory Visit: Payer: 59 | Admitting: Family Medicine

## 2018-07-08 DIAGNOSIS — M76822 Posterior tibial tendinitis, left leg: Secondary | ICD-10-CM | POA: Diagnosis not present

## 2018-07-08 MED ORDER — NITROGLYCERIN 0.2 MG/HR TD PT24: MEDICATED_PATCH | TRANSDERMAL | 1 refills | Status: DC

## 2018-07-08 NOTE — Assessment & Plan Note (Signed)
Repeat injection given today.  Tolerated the procedure well.  We discussed the orthotics that were made for her and wearing those on a more regular basis.  Hoping that this will be more beneficial.  Topical anti-inflammatories home exercises compression.  Worsening symptoms patient could be a candidate for PRP.  Patient will consider this.  Follow-up with me again in 4 to 6 weeks

## 2018-07-08 NOTE — Patient Instructions (Addendum)
Good to see you  Injected the left side today  You should do well  Break in the orthotics slowly  HOKA [place] 6, [place], Carbonx or look at [facility] shoes  Nitroglycerin Protocol   Apply 1/4 nitroglycerin patch to affected area daily.  Change position of patch within the affected area every 24 hours.  You may experience a headache during the first 1-2 weeks of using the patch, these should subside.  If you experience headaches after beginning nitroglycerin patch treatment, you may take your preferred over the counter pain reliever.  Another side effect of the nitroglycerin patch is skin irritation or rash related to patch adhesive.  Please notify our office if you develop more severe headaches or rash, and stop the patch.  Tendon healing with nitroglycerin patch may require 12 to 24 weeks depending on the extent of injury.  Men should not use if taking Viagra, Cialis, or Levitra.   Do not use if you have migraines or rosacea.   See me again in 6 weeks

## 2018-07-17 DIAGNOSIS — M9903 Segmental and somatic dysfunction of lumbar region: Secondary | ICD-10-CM | POA: Diagnosis not present

## 2018-07-17 DIAGNOSIS — M9905 Segmental and somatic dysfunction of pelvic region: Secondary | ICD-10-CM | POA: Diagnosis not present

## 2018-07-17 DIAGNOSIS — M5386 Other specified dorsopathies, lumbar region: Secondary | ICD-10-CM | POA: Diagnosis not present

## 2018-07-28 DIAGNOSIS — D069 Carcinoma in situ of cervix, unspecified: Secondary | ICD-10-CM | POA: Diagnosis not present

## 2018-07-28 DIAGNOSIS — Z6841 Body Mass Index (BMI) 40.0 and over, adult: Secondary | ICD-10-CM | POA: Diagnosis not present

## 2018-07-28 DIAGNOSIS — Z01419 Encounter for gynecological examination (general) (routine) without abnormal findings: Secondary | ICD-10-CM | POA: Diagnosis not present

## 2018-07-28 DIAGNOSIS — Z118 Encounter for screening for other infectious and parasitic diseases: Secondary | ICD-10-CM | POA: Diagnosis not present

## 2018-09-09 ENCOUNTER — Other Ambulatory Visit: Payer: Self-pay | Admitting: Family Medicine

## 2018-09-09 DIAGNOSIS — M9903 Segmental and somatic dysfunction of lumbar region: Secondary | ICD-10-CM | POA: Diagnosis not present

## 2018-09-09 DIAGNOSIS — M5386 Other specified dorsopathies, lumbar region: Secondary | ICD-10-CM | POA: Diagnosis not present

## 2018-09-09 DIAGNOSIS — M9905 Segmental and somatic dysfunction of pelvic region: Secondary | ICD-10-CM | POA: Diagnosis not present

## 2018-09-25 DIAGNOSIS — M9903 Segmental and somatic dysfunction of lumbar region: Secondary | ICD-10-CM | POA: Diagnosis not present

## 2018-09-25 DIAGNOSIS — M9905 Segmental and somatic dysfunction of pelvic region: Secondary | ICD-10-CM | POA: Diagnosis not present

## 2018-09-25 DIAGNOSIS — M5386 Other specified dorsopathies, lumbar region: Secondary | ICD-10-CM | POA: Diagnosis not present

## 2018-10-16 ENCOUNTER — Encounter: Payer: Self-pay | Admitting: Internal Medicine

## 2018-12-05 ENCOUNTER — Other Ambulatory Visit: Payer: Self-pay | Admitting: Family Medicine

## 2018-12-14 ENCOUNTER — Encounter: Payer: Self-pay | Admitting: Internal Medicine

## 2018-12-21 ENCOUNTER — Other Ambulatory Visit: Payer: Self-pay

## 2018-12-21 ENCOUNTER — Encounter: Payer: Self-pay | Admitting: Internal Medicine

## 2018-12-21 DIAGNOSIS — Z20828 Contact with and (suspected) exposure to other viral communicable diseases: Secondary | ICD-10-CM

## 2018-12-21 DIAGNOSIS — Z20822 Contact with and (suspected) exposure to covid-19: Secondary | ICD-10-CM

## 2018-12-23 LAB — NOVEL CORONAVIRUS, NAA: SARS-CoV-2, NAA: NOT DETECTED

## 2019-03-06 ENCOUNTER — Other Ambulatory Visit: Payer: Self-pay | Admitting: Family Medicine

## 2019-03-20 ENCOUNTER — Other Ambulatory Visit: Payer: Self-pay | Admitting: Internal Medicine

## 2019-04-09 IMAGING — DX DG CHEST 2V
2 series · 2 of 2 positions shown · non-contrast
Comparison: None.

CLINICAL DATA: Intermittent chest pain and shortness of breath for
1 year.

EXAM:
CHEST  2 VIEW

[chest pa]
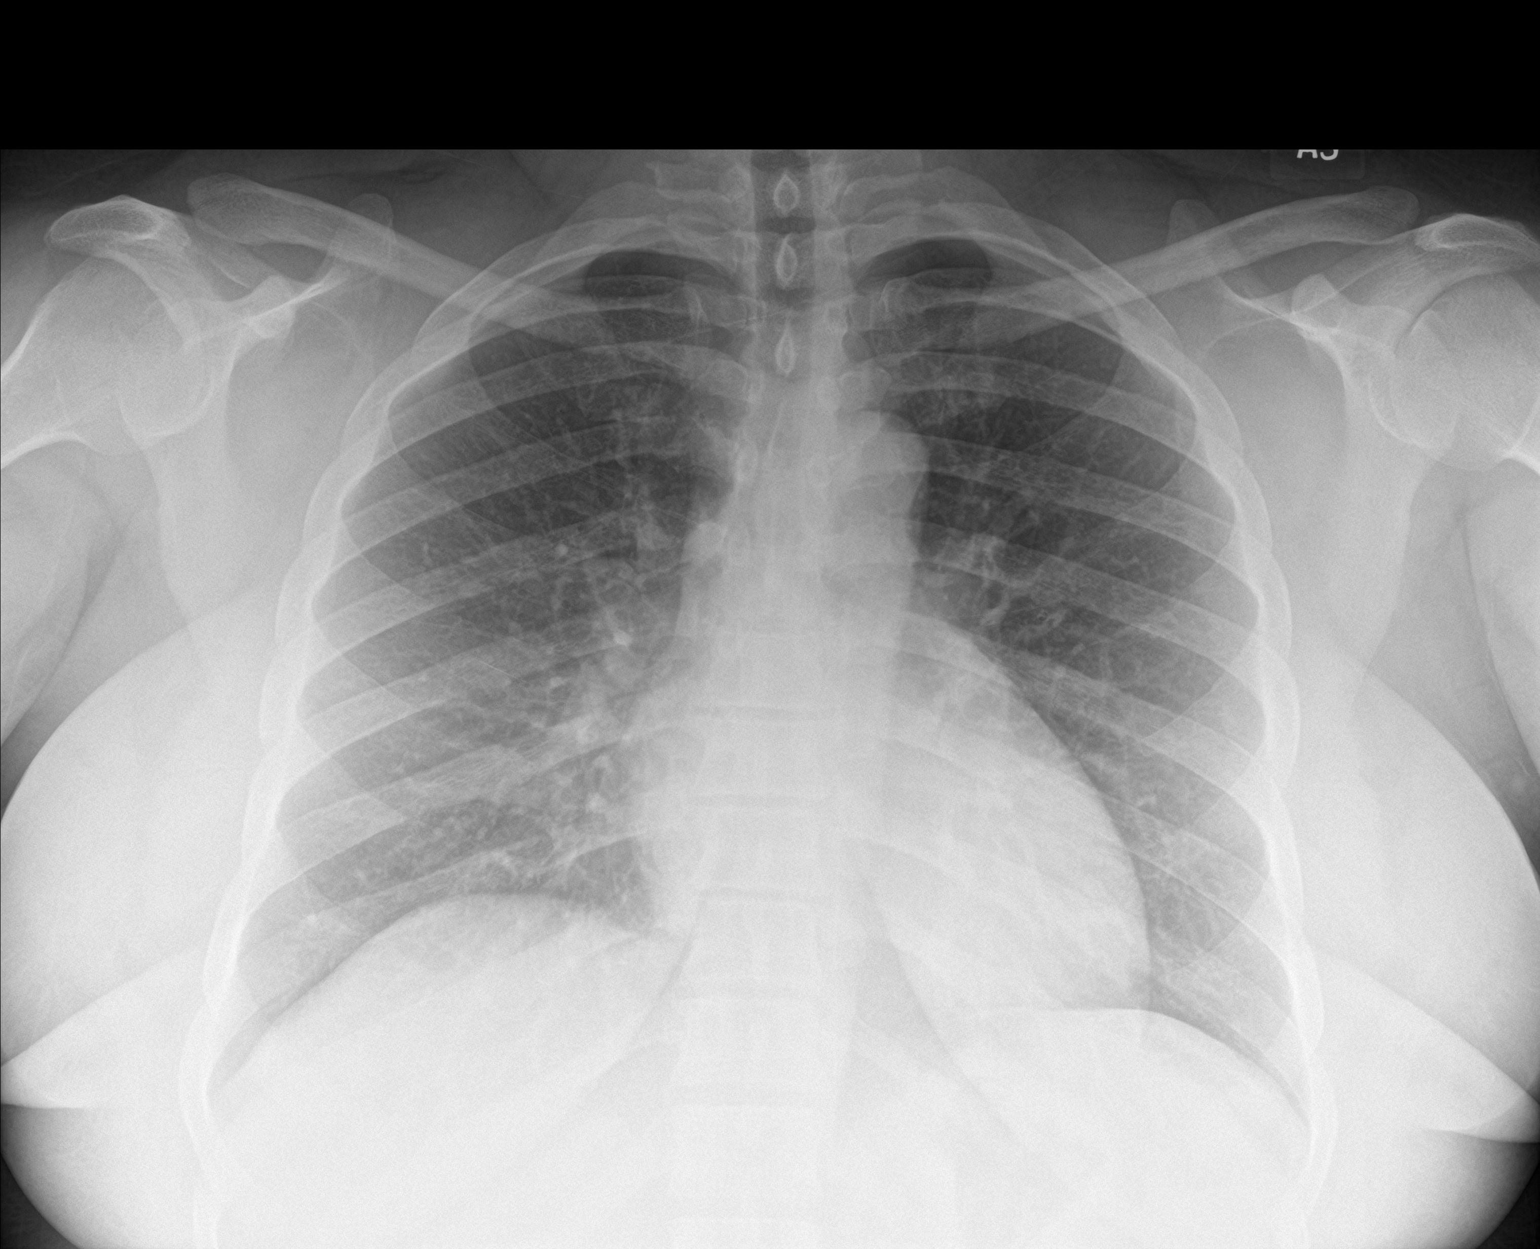

[chest lat]
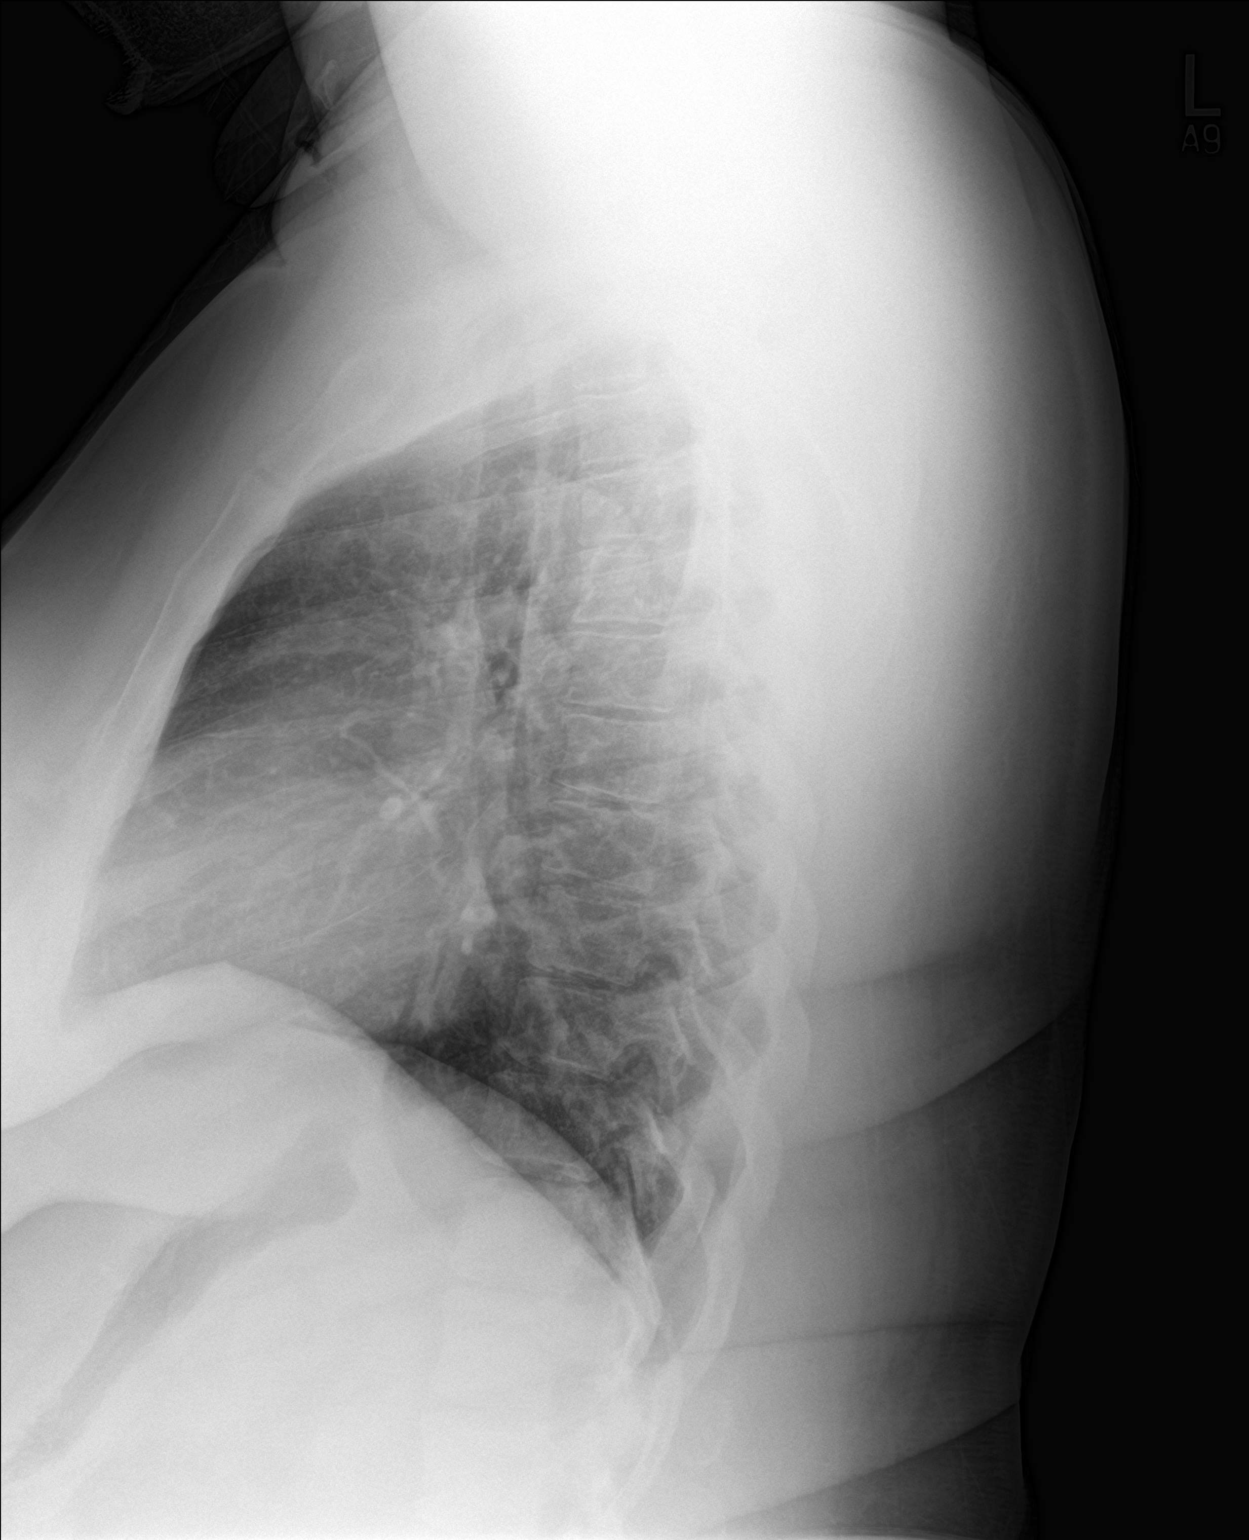

[2 of 2 positions shown; findings below may reference images not displayed]

FINDINGS: The heart size and mediastinal contours are within normal limits.
Both lungs are clear. The visualized skeletal structures are
unremarkable.
IMPRESSION: Negative.  No active cardiopulmonary disease.

## 2019-04-19 ENCOUNTER — Ambulatory Visit: Payer: 59 | Admitting: Internal Medicine

## 2019-04-21 ENCOUNTER — Encounter: Payer: Self-pay | Admitting: Internal Medicine

## 2019-04-21 ENCOUNTER — Other Ambulatory Visit: Payer: Self-pay

## 2019-04-21 ENCOUNTER — Other Ambulatory Visit (INDEPENDENT_AMBULATORY_CARE_PROVIDER_SITE_OTHER): Payer: 59

## 2019-04-21 ENCOUNTER — Ambulatory Visit (INDEPENDENT_AMBULATORY_CARE_PROVIDER_SITE_OTHER): Payer: 59 | Admitting: Internal Medicine

## 2019-04-21 VITALS — BP 164/100 | HR 91 | Temp 98.9°F | Ht 62.0 in | Wt 267.0 lb

## 2019-04-21 DIAGNOSIS — Z0000 Encounter for general adult medical examination without abnormal findings: Secondary | ICD-10-CM

## 2019-04-21 DIAGNOSIS — J410 Simple chronic bronchitis: Secondary | ICD-10-CM | POA: Diagnosis not present

## 2019-04-21 DIAGNOSIS — G4733 Obstructive sleep apnea (adult) (pediatric): Secondary | ICD-10-CM | POA: Diagnosis not present

## 2019-04-21 DIAGNOSIS — I10 Essential (primary) hypertension: Secondary | ICD-10-CM | POA: Diagnosis not present

## 2019-04-21 DIAGNOSIS — R7303 Prediabetes: Secondary | ICD-10-CM

## 2019-04-21 DIAGNOSIS — E118 Type 2 diabetes mellitus with unspecified complications: Secondary | ICD-10-CM | POA: Insufficient documentation

## 2019-04-21 LAB — LIPID PANEL
Cholesterol: 125 mg/dL (ref 0–200)
HDL: 36.7 mg/dL — ABNORMAL LOW (ref >39.00)
LDL Cholesterol: 69 mg/dL (ref 0–99)
NonHDL: 88.67
Total CHOL/HDL Ratio: 3
Triglycerides: 96 mg/dL (ref 0.0–149.0)
VLDL: 19.2 mg/dL (ref 0.0–40.0)

## 2019-04-21 LAB — COMPREHENSIVE METABOLIC PANEL
ALT: 27 U/L (ref 0–35)
AST: 25 U/L (ref 0–37)
Albumin: 4 g/dL (ref 3.5–5.2)
Alkaline Phosphatase: 45 U/L (ref 39–117)
BUN: 12 mg/dL (ref 6–23)
CO2: 29 mEq/L (ref 19–32)
Calcium: 9.5 mg/dL (ref 8.4–10.5)
Chloride: 100 mEq/L (ref 96–112)
Creatinine, Ser: 0.77 mg/dL (ref 0.40–1.20)
GFR: 104.7 mL/min (ref >60.00)
Glucose, Bld: 111 mg/dL — ABNORMAL HIGH (ref 70–99)
Potassium: 3.6 mEq/L (ref 3.5–5.1)
Sodium: 139 mEq/L (ref 135–145)
Total Bilirubin: 0.4 mg/dL (ref 0.2–1.2)
Total Protein: 7.4 g/dL (ref 6.0–8.3)

## 2019-04-21 LAB — CBC
HCT: 41.1 % (ref 36.0–46.0)
Hemoglobin: 14 g/dL (ref 12.0–15.0)
MCHC: 34.1 g/dL (ref 30.0–36.0)
MCV: 90.8 fl (ref 78.0–100.0)
Platelets: 207 10*3/uL (ref 150.0–400.0)
RBC: 4.52 Mil/uL (ref 3.87–5.11)
RDW: 13.6 % (ref 11.5–15.5)
WBC: 6.4 10*3/uL (ref 4.0–10.5)

## 2019-04-21 LAB — MAGNESIUM: Magnesium: 1.7 mg/dL (ref 1.5–2.5)

## 2019-04-21 LAB — HEMOGLOBIN A1C: Hgb A1c MFr Bld: 7.3 % — ABNORMAL HIGH (ref 4.6–6.5)

## 2019-04-21 MED ORDER — LOSARTAN POTASSIUM-HCTZ 100-25 MG PO TABS: 1.0000 | ORAL_TABLET | Freq: Every day | ORAL | 3 refills | Status: DC

## 2019-04-21 NOTE — Patient Instructions (Addendum)
The goal for blood pressure is around 140/90.  Health Maintenance, Female Adopting a healthy lifestyle and getting preventive care are important in promoting health and wellness. Ask your health care provider about:  The right schedule for you to have regular tests and exams.  Things you can do on your own to prevent diseases and keep yourself healthy. What should I know about diet, weight, and exercise? Eat a healthy diet   Eat a diet that includes plenty of vegetables, fruits, low-fat dairy products, and lean protein.  Do not eat a lot of foods that are high in solid fats, added sugars, or sodium. Maintain a healthy weight Body mass index (BMI) is used to identify weight problems. It estimates body fat based on height and weight. Your health care provider can help determine your BMI and help you achieve or maintain a healthy weight. Get regular exercise Get regular exercise. This is one of the most important things you can do for your health. Most adults should:  Exercise for at least 150 minutes each week. The exercise should increase your heart rate and make you sweat (moderate-intensity exercise).  Do strengthening exercises at least twice a week. This is in addition to the moderate-intensity exercise.  Spend less time sitting. Even light physical activity can be beneficial. Watch cholesterol and blood lipids Have your blood tested for lipids and cholesterol at 32 years of age, then have this test every 5 years. Have your cholesterol levels checked more often if:  Your lipid or cholesterol levels are high.  You are older than 32 years of age.  You are at high risk for heart disease. What should I know about cancer screening? Depending on your health history and family history, you may need to have cancer screening at various ages. This may include screening for:  Breast cancer.  Cervical cancer.  Colorectal cancer.  Skin cancer.  Lung cancer. What should I know  about heart disease, diabetes, and high blood pressure? Blood pressure and heart disease  High blood pressure causes heart disease and increases the risk of stroke. This is more likely to develop in people who have high blood pressure readings, are of African descent, or are overweight.  Have your blood pressure checked: ? Every 3-5 years if you are 74-70 years of age. ? Every year if you are 49 years old or older. Diabetes Have regular diabetes screenings. This checks your fasting blood sugar level. Have the screening done:  Once every three years after age 46 if you are at a normal weight and have a low risk for diabetes.  More often and at a younger age if you are overweight or have a high risk for diabetes. What should I know about preventing infection? Hepatitis B If you have a higher risk for hepatitis B, you should be screened for this virus. Talk with your health care provider to find out if you are at risk for hepatitis B infection. Hepatitis C Testing is recommended for:  Everyone born from [birth year] through 1964.  Anyone with known risk factors for hepatitis C. Sexually transmitted infections (STIs)  Get screened for STIs, including gonorrhea and chlamydia, if: ? You are sexually active and are younger than 32 years of age. ? You are older than 32 years of age and your health care provider tells you that you are at risk for this type of infection. ? Your sexual activity has changed since you were last screened, and you are at increased risk for  chlamydia or gonorrhea. Ask your health care provider if you are at risk.  Ask your health care provider about whether you are at high risk for HIV. Your health care provider may recommend a prescription medicine to help prevent HIV infection. If you choose to take medicine to prevent HIV, you should first get tested for HIV. You should then be tested every 3 months for as long as you are taking the medicine. Pregnancy  If you are about  to stop having your period (premenopausal) and you may become pregnant, seek counseling before you get pregnant.  Take 400 to 800 micrograms (mcg) of folic acid every day if you become pregnant.  Ask for birth control (contraception) if you want to prevent pregnancy. Osteoporosis and menopause Osteoporosis is a disease in which the bones lose minerals and strength with aging. This can result in bone fractures. If you are 12 years old or older, or if you are at risk for osteoporosis and fractures, ask your health care provider if you should:  Be screened for bone loss.  Take a calcium or vitamin D supplement to lower your risk of fractures.  Be given hormone replacement therapy (HRT) to treat symptoms of menopause. Follow these instructions at home: Lifestyle  Do not use any products that contain nicotine or tobacco, such as cigarettes, e-cigarettes, and chewing tobacco. If you need help quitting, ask your health care provider.  Do not use street drugs.  Do not share needles.  Ask your health care provider for help if you need support or information about quitting drugs. Alcohol use  Do not drink alcohol if: ? Your health care provider tells you not to drink. ? You are pregnant, may be pregnant, or are planning to become pregnant.  If you drink alcohol: ? Limit how much you use to 0-1 drink a day. ? Limit intake if you are breastfeeding.  Be aware of how much alcohol is in your drink. In the [place] one drink equals one 12 oz bottle of beer (355 mL), one 5 oz glass of wine (148 mL), or one 1 oz glass of hard liquor (44 mL). General instructions  Schedule regular health, dental, and eye exams.  Stay current with your vaccines.  Tell your health care provider if: ? You often feel depressed. ? You have ever been abused or do not feel safe at home. Summary  Adopting a healthy lifestyle and getting preventive care are important in promoting health and wellness.  Follow your  health care provider's instructions about healthy diet, exercising, and getting tested or screened for diseases.  Follow your health care provider's instructions on monitoring your cholesterol and blood pressure. This information is not intended to replace advice given to you by your health care provider. Make sure you discuss any questions you have with your health care provider. Document Released: 11/26/2010 Document Revised: 05/06/2018 Document Reviewed: 05/06/2018 Elsevier Patient Education  2020 [employer].

## 2019-04-21 NOTE — Assessment & Plan Note (Signed)
Still smoking and no desire to quit at this time.  

## 2019-04-21 NOTE — Assessment & Plan Note (Signed)
Not using CPAP and is still thinking about this.

## 2019-04-21 NOTE — Assessment & Plan Note (Signed)
Weight is up about 15 pounds with pandemic and she is not sure about working on that now due to stress.

## 2019-04-21 NOTE — Assessment & Plan Note (Signed)
Checking HgA1c and adjust as needed.  

## 2019-04-21 NOTE — Assessment & Plan Note (Signed)
Flu shot declines. Tetanus declines. Pap smear reminded she is due with gyn. Counseled about sun safety and mole surveillance. Counseled about the dangers of distracted driving. Given 10 year screening recommendations.

## 2019-04-21 NOTE — Assessment & Plan Note (Signed)
She is asked to monitor at home. She did switch to losartan/hctz but did not come back as requested for BP recheck and labs. Checking CMP today. If home readings are elevated she will need another agent.

## 2019-04-21 NOTE — Progress Notes (Signed)
   Subjective:   Patient ID: Deanna Richards, female    DOB: [birth year], 33 y.o.   MRN: [id number]  HPI The patient is a 33 YO female coming in for physical.   PMH, Yoe, social history reviewed and updated  Review of Systems  Constitutional: Positive for activity change and appetite change.  HENT: Negative.   Eyes: Negative.   Respiratory: Negative for cough, chest tightness and shortness of breath.   Cardiovascular: Negative for chest pain, palpitations and leg swelling.  Gastrointestinal: Negative for abdominal distention, abdominal pain, constipation, diarrhea, nausea and vomiting.  Musculoskeletal: Positive for arthralgias and myalgias.  Skin: Negative.   Neurological: Negative.   Psychiatric/Behavioral: Negative.     Objective:  Physical Exam Constitutional:      Appearance: She is well-developed. She is obese.  HENT:     Head: Normocephalic and atraumatic.  Neck:     Musculoskeletal: Normal range of motion.  Cardiovascular:     Rate and Rhythm: Normal rate and regular rhythm.  Pulmonary:     Effort: Pulmonary effort is normal. No respiratory distress.     Breath sounds: Normal breath sounds. No wheezing or rales.  Abdominal:     General: Bowel sounds are normal. There is no distension.     Palpations: Abdomen is soft.     Tenderness: There is no abdominal tenderness. There is no rebound.  Skin:    General: Skin is warm and dry.  Neurological:     Mental Status: She is alert and oriented to person, place, and time.     Coordination: Coordination normal.     Vitals:   04/21/19 1019  BP: (!) 160/100  Pulse: 91  Temp: 98.9 F (37.2 C)  TempSrc: Oral  SpO2: 99%  Weight: 267 lb (121.1 kg)  Height: 5\' 2"  (1.575 m)    This visit occurred during the SARS-CoV-2 public health emergency.  Safety protocols were in place, including screening questions prior to the visit, additional usage of staff PPE, and extensive cleaning of exam room while observing appropriate  contact time as indicated for disinfecting solutions.   Assessment & Plan:

## 2019-05-06 ENCOUNTER — Encounter: Payer: Self-pay | Admitting: Internal Medicine

## 2019-05-06 ENCOUNTER — Other Ambulatory Visit: Payer: Self-pay

## 2019-05-06 DIAGNOSIS — Z20822 Contact with and (suspected) exposure to covid-19: Secondary | ICD-10-CM

## 2019-05-09 LAB — NOVEL CORONAVIRUS, NAA: SARS-CoV-2, NAA: NOT DETECTED

## 2019-06-18 ENCOUNTER — Other Ambulatory Visit: Payer: Self-pay | Admitting: Family Medicine

## 2019-09-11 ENCOUNTER — Other Ambulatory Visit: Payer: Self-pay | Admitting: Family Medicine

## 2019-09-14 ENCOUNTER — Encounter: Payer: Self-pay | Admitting: Family Medicine

## 2019-09-14 ENCOUNTER — Ambulatory Visit (INDEPENDENT_AMBULATORY_CARE_PROVIDER_SITE_OTHER): Payer: 59 | Admitting: Family Medicine

## 2019-09-14 DIAGNOSIS — M76822 Posterior tibial tendinitis, left leg: Secondary | ICD-10-CM

## 2019-09-14 MED ORDER — VITAMIN D (ERGOCALCIFEROL) 1.25 MG (50000 UNIT) PO CAPS: 50000.0000 [IU] | ORAL_CAPSULE | ORAL | 3 refills | Status: DC

## 2019-09-14 MED ORDER — MELOXICAM 15 MG PO TABS: 15.0000 mg | ORAL_TABLET | Freq: Every day | ORAL | 1 refills | Status: DC

## 2019-09-14 NOTE — Progress Notes (Signed)
Virtual Visit via Video Note  I connected with Deanna Richards on 09/14/19 at  4:15 PM EDT by a video enabled telemedicine application and verified that I am speaking with the correct person using two identifiers.  Location: Patient: Patient is in home setting alone. Provider: In office setting alone   I discussed the limitations of evaluation and management by telemedicine and the availability of in person appointments. The patient expressed understanding and agreed to proceed.  History of Present Illness: 34 year old female with a past medical history significant for posterior tibialis tendinitis that has responded well to injections previously.  Patient is trying to hold out on another injection at this moment.  Patient states that if she wears any type of shoe she has increasing discomfort and is unable to walk for multiple days.  They did not have another injection.  Patient states that the vitamin D has been significantly helpful.  Patient has no longer using the nitroglycerin.  Has not tried any anti-inflammatories.  When she wears good shoes she seems to do relatively well though.    Observations/Objective: Alert and oriented x3,   Assessment and Plan: 34 year old female with a past medical history of for posterior tibialis tendinitis chronic problem.  Patient describes maybe some mild exacerbation recently.  Discussed avoiding certain activities, increase icing follow-up again in 4 to 8 weeks.  Worsening symptoms will consider injections.   Follow Up Instructions: As stated above    I discussed the assessment and treatment plan with the patient. The patient was provided an opportunity to ask questions and all were answered. The patient agreed with the plan and demonstrated an understanding of the instructions.   The patient was advised to call back or seek an in-person evaluation if the symptoms worsen or if the condition fails to improve as anticipated.  I provided 17 minutes of  face-to-face time during this encounter.  Did have some mild difficulty with the virtual platform had to do most of the interview on her phone   Judi Saa, DO

## 2020-01-13 ENCOUNTER — Telehealth: Payer: Self-pay

## 2020-01-13 NOTE — Telephone Encounter (Deleted)
Error

## 2020-01-14 ENCOUNTER — Ambulatory Visit: Payer: 59 | Admitting: Internal Medicine

## 2020-01-18 ENCOUNTER — Other Ambulatory Visit: Payer: Self-pay

## 2020-01-18 ENCOUNTER — Ambulatory Visit (INDEPENDENT_AMBULATORY_CARE_PROVIDER_SITE_OTHER): Payer: 59

## 2020-01-18 ENCOUNTER — Ambulatory Visit: Payer: Self-pay

## 2020-01-18 ENCOUNTER — Ambulatory Visit: Payer: 59 | Admitting: Family Medicine

## 2020-01-18 ENCOUNTER — Encounter: Payer: Self-pay | Admitting: Family Medicine

## 2020-01-18 VITALS — BP 150/98 | HR 101 | Ht 62.0 in | Wt 262.0 lb

## 2020-01-18 DIAGNOSIS — M7521 Bicipital tendinitis, right shoulder: Secondary | ICD-10-CM | POA: Insufficient documentation

## 2020-01-18 DIAGNOSIS — M79601 Pain in right arm: Secondary | ICD-10-CM | POA: Diagnosis not present

## 2020-01-18 NOTE — Assessment & Plan Note (Addendum)
Seems to be more of a bicep tendinitis.  X-rays pending.  Discussed home exercises and icing regimen, increase activity as tolerated.  Discussed compression as well icing regimen worsening pain physical therapy and injection follow-up again in 4 to 8 weeks.

## 2020-01-18 NOTE — Patient Instructions (Signed)
Arm compression Voltaren gel Keep hands in peripheral vision See me in 4-6 weeks Xray today shoulder and elbow

## 2020-01-18 NOTE — Progress Notes (Signed)
Deanna Richards Phone: [phone] Subjective:   I Deanna Richards am serving as a Neurosurgeon for Dr. Antoine Primas.  This visit occurred during the SARS-CoV-2 public health emergency.  Safety protocols were in place, including screening questions prior to the visit, additional usage of staff PPE, and extensive cleaning of exam room while observing appropriate contact time as indicated for disinfecting solutions.   I'm seeing this patient by the request  of:  Myrlene Broker, MD  CC: Right arm pain  [id number]   09/14/2019  34 year old female with a past medical history of for posterior tibialis tendinitis chronic problem.  Patient describes maybe some mild exacerbation recently.  Discussed avoiding certain activities, increase icing follow-up again in 4 to 8 weeks.  Worsening symptoms will consider injections.   Update 01/18/2020 Deanna Richards is a 34 y.o. female coming in with complaint of right arm pain. Last seen for ankle pain in April 2021. Patient states the arm is painful. Pain in the forearm. States she has limited ROM and braces the arm against her side. Pain radiates up the arm and she has noticed some weakness with lifting heavy objects.   Sometimes wakes her up at night     Past Medical History:  Diagnosis Date  . 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 PCOS (polycystic ovarian syndrome)    no current med.   Past Surgical History:  Procedure Laterality Date  . CARPAL TUNNEL RELEASE Right 11/05/2013   Procedure: CARPAL TUNNEL RELEASE ENDOSCOPIC RIGHT WRIST;  Surgeon: Sheral Apley, MD;  Location: [facility];  Service: Orthopedics;  Laterality: Right;  . NO PAST SURGERIES     Social History   Socioeconomic History  . Marital status: Single    Spouse name: Not on file  . Number of children: Not on file  . Years of education: Not on file  . Highest education level: Not  on file  Occupational History  . Not on file  Tobacco Use  . Smoking status: Current Every Day Smoker    Years: 5.00    Types: Cigars  . Smokeless tobacco: Never Used  . Tobacco comment: 1-2 Black and Mild/day  Substance and Sexual Activity  . Alcohol use: Yes    Alcohol/week: 0.0 standard drinks    Comment: weekends  . Drug use: No  . Sexual activity: Yes    Birth control/protection: None, Pill  Other Topics Concern  . Not on file  Social History Narrative  . Not on file   Social Determinants of Health   Financial Resource Strain:   . Difficulty of Paying Living Expenses: Not on file  Food Insecurity:   . Worried About Programme researcher, broadcasting/film/video in the Last Year: Not on file  . Ran Out of Food in the Last Year: Not on file  Transportation Needs:   . Lack of Transportation (Medical): Not on file  . Lack of Transportation (Non-Medical): Not on file  Physical Activity:   . Days of Exercise per Week: Not on file  . Minutes of Exercise per Session: Not on file  Stress:   . Feeling of Stress : Not on file  Social Connections:   . Frequency of Communication with Friends and Family: Not on file  . Frequency of Social Gatherings with Friends and Family: Not on file  . Attends Religious Services: Not on file  . Active Member of Clubs or Organizations:  Not on file  . Attends Banker Meetings: Not on file  . Marital Status: Not on file   No Known Allergies Family History  Adopted: Yes  Problem Relation Age of Onset  . Hypertension Mother     Current Outpatient Medications (Endocrine & Metabolic):  .  norethindrone (AYGESTIN) 5 MG tablet, Take 1 tablet by mouth every 30 (thirty) days.  Current Outpatient Medications (Cardiovascular):  .  losartan-hydrochlorothiazide (HYZAAR) 100-25 MG tablet, Take 1 tablet by mouth daily. .  nitroGLYCERIN (NITRODUR - DOSED IN MG/24 HR) 0.2 mg/hr patch, 1/4 patch daily  Current Outpatient Medications (Respiratory):  .  albuterol  (PROVENTIL HFA;VENTOLIN HFA) 108 (90 Base) MCG/ACT inhaler, Inhale 2 puffs into the lungs every 6 (six) hours as needed for wheezing or shortness of breath. .  fluticasone (FLONASE) 50 MCG/ACT nasal spray, Place 2 sprays into both nostrils daily.  Current Outpatient Medications (Analgesics):  .  meloxicam (MOBIC) 15 MG tablet, Take 1 tablet (15 mg total) by mouth daily.  Current Outpatient Medications (Hematological):  .  folic acid (FOLVITE) 1 MG tablet, Take 1 mg by mouth daily.  Current Outpatient Medications (Other):  Marland Kitchen  Vitamin D, Ergocalciferol, (DRISDOL) 1.25 MG (50000 UNIT) CAPS capsule, Take 1 capsule (50,000 Units total) by mouth once a week.   Reviewed prior external information including notes and imaging from  primary care provider As well as notes that were available from care everywhere and other healthcare systems.  Past medical history, social, surgical and family history all reviewed in electronic medical record.  No pertanent information unless stated regarding to the chief complaint.   Review of Systems:  No headache, visual changes, nausea, vomiting, diarrhea, constipation, dizziness, abdominal pain, skin rash, fevers, chills, night sweats, weight loss, swollen lymph nodes, body aches, joint swelling, chest pain, shortness of breath, mood changes. POSITIVE muscle aches  Objective  Blood pressure (!) 150/98, pulse (!) 101, height 5\' 2"  (1.575 m), weight 262 lb (118.8 kg), last menstrual period 12/29/2019, SpO2 97 %.   General: No apparent distress alert and oriented x3 mood and affect normal, dressed appropriately.  HEENT: Pupils equal, extraocular movements intact  Respiratory: Patient's speak in full sentences and does not appear short of breath  Cardiovascular: No lower extremity edema, non tender, no erythema  Neuro: Cranial nerves II through XII are intact, neurovascularly intact in all extremities with 2+ DTRs and 2+ pulses.  Gait normal with good balance and  coordination.  MSK:   Right shoulder does have some mild positive spots. Positive pain now seems to be more distal around the elbow at the insertion on the radial tuberosity.  Patient does have mild impingement with Neer's and Hawkins.  Limited musculoskeletal ultrasound was performed and interpreted by 02/28/2020  Difficult to see secondary to patient's body habitus.  Rotator cuff appears to be intact. Bicep tendon mild hypoechoic changes noted.  Very mild subacromial bursitis noted as well.  Mild synovitis of the acromioclavicular joint noted   Impression and Recommendations:     The above documentation has been reviewed and is accurate and complete Judi Saa, DO       Note: This dictation was prepared with Dragon dictation along with smaller phrase technology. Any transcriptional errors that result from this process are unintentional.

## 2020-01-19 ENCOUNTER — Other Ambulatory Visit: Payer: Self-pay

## 2020-01-19 ENCOUNTER — Encounter: Payer: Self-pay | Admitting: Family Medicine

## 2020-01-19 MED ORDER — DICLOFENAC SODIUM 2 % EX SOLN: 2.0000 g | Freq: Two times a day (BID) | CUTANEOUS | 3 refills | Status: DC

## 2020-01-25 ENCOUNTER — Ambulatory Visit: Payer: Self-pay | Attending: Internal Medicine

## 2020-01-25 DIAGNOSIS — Z23 Encounter for immunization: Secondary | ICD-10-CM

## 2020-01-25 NOTE — Progress Notes (Signed)
   Covid-19 Vaccination Clinic  Name:  Deanna Richards    MRN: [id number] DOB: Jun 09, 1985  01/25/2020  [name] was observed post Covid-19 immunization for 15 minutes without incident. She was provided with Vaccine Information Sheet and instruction to access the V-Safe system.   [name] was instructed to call 911 with any severe reactions post vaccine: Marland Kitchen Difficulty breathing  . Swelling of face and throat  . A fast heartbeat  . A bad rash all over body  . Dizziness and weakness   Immunizations Administered    Name Date Dose VIS Date Route   Moderna COVID-19 Vaccine 01/25/2020 10:09 AM 0.5 mL 04/2019 Intramuscular   Manufacturer: Moderna   Lot: [id number]   NDC: 65537-482-70

## 2020-02-15 ENCOUNTER — Encounter: Payer: Self-pay | Admitting: Internal Medicine

## 2020-02-15 ENCOUNTER — Ambulatory Visit: Payer: Self-pay | Attending: Internal Medicine

## 2020-02-15 DIAGNOSIS — Z23 Encounter for immunization: Secondary | ICD-10-CM

## 2020-02-15 NOTE — Progress Notes (Unsigned)
Mrs. Shutters arrived for her second dose of Pfizer vaccine. Documentation in Epic shows that patient received Moderna vaccine on initial dose. Site Lead, Alesia Morin was notified and contacted Dr. Daiva Eves concerning which vaccination to administer as patient states she clearly believes she received Pfizer vaccination as requested. Dr. Daiva Eves explained that it will be safe to administer Pfizer at this time. This Clinical research associate and site Lead explained same to patient and Pfizer vaccine was administered. Vance Peper, RN BSN

## 2020-02-15 NOTE — Progress Notes (Signed)
   Covid-19 Vaccination Clinic  Name:  Deanna Richards    MRN: [id number] DOB: 03-10-1986  02/15/2020  Deanna Richards was observed post Covid-19 immunization for 15 minutes without incident. She was provided with Vaccine Information Sheet and instruction to access the V-Safe system.   Deanna Richards was instructed to call 911 with any severe reactions post vaccine: Marland Kitchen Difficulty breathing  . Swelling of face and throat  . A fast heartbeat  . A bad rash all over body  . Dizziness and weakness   Immunizations Administered    Name Date Dose VIS Date Route   Pfizer COVID-19 Vaccine 02/15/2020  9:34 AM 0.3 mL 07/21/2018 Intramuscular   Manufacturer: [employer]   Lot: [id number]   NDC: [address]-5

## 2020-02-28 ENCOUNTER — Ambulatory Visit: Payer: 59 | Admitting: Family Medicine

## 2020-04-19 ENCOUNTER — Other Ambulatory Visit: Payer: Self-pay | Admitting: Internal Medicine

## 2020-04-19 ENCOUNTER — Telehealth: Payer: Self-pay | Admitting: Internal Medicine

## 2020-04-19 ENCOUNTER — Other Ambulatory Visit: Payer: Self-pay

## 2020-04-19 NOTE — Telephone Encounter (Signed)
losartan-hydrochlorothiazide (HYZAAR) 100-25 MG tablet [facility] [place], [place] - [address] Phone:  [phone]  Fax:  [phone]     Patient is completely out of medication.

## 2020-04-19 NOTE — Telephone Encounter (Signed)
Pt notified that she needs OV since last OV was 04/21/19.  Appt made for 12/3, med refilled x 1 month.

## 2020-05-01 ENCOUNTER — Encounter: Payer: 59 | Admitting: Internal Medicine

## 2020-05-05 ENCOUNTER — Encounter: Payer: Self-pay | Admitting: Internal Medicine

## 2020-05-08 ENCOUNTER — Ambulatory Visit: Payer: 59 | Admitting: Internal Medicine

## 2020-05-08 MED ORDER — LOSARTAN POTASSIUM-HCTZ 100-25 MG PO TABS
1.0000 | ORAL_TABLET | Freq: Every day | ORAL | 0 refills | Status: DC
Start: 2020-05-08 — End: 2020-06-08

## 2020-06-02 ENCOUNTER — Encounter: Payer: Self-pay | Admitting: Internal Medicine

## 2020-06-02 ENCOUNTER — Telehealth (INDEPENDENT_AMBULATORY_CARE_PROVIDER_SITE_OTHER): Payer: 59 | Admitting: Internal Medicine

## 2020-06-02 DIAGNOSIS — Z20822 Contact with and (suspected) exposure to covid-19: Secondary | ICD-10-CM | POA: Diagnosis not present

## 2020-06-02 NOTE — Progress Notes (Signed)
Virtual Visit via Video Note  I connected with ELEN ACERO on 06/02/20 at  8:40 AM EST by a video enabled telemedicine application and verified that I am speaking with the correct person using two identifiers.  The patient and the provider were at separate locations throughout the entire encounter. Patient location: home, Provider location: work   I discussed the limitations of evaluation and management by telemedicine and the availability of in person appointments. The patient expressed understanding and agreed to proceed. The patient and the provider were the only parties present for the visit unless noted in HPI below.  History of Present Illness: The patient is a 35 y.o. female with visit for body aches and diarrhea. Started Wednesday with headaches and fatigue. Has had improvement of symptoms although still tired today. No diarrhea since last night. Denies known sick contact. Overall it is improving. Has tried tylenol which helped the headache and body aches.  Observations/Objective: Appearance: normal, breathing appears normal, casual grooming, abdomen does not appear distended, throat not well visualized, memory normal, mental status is A and O times 3  Assessment and Plan: See problem oriented charting  Follow Up Instructions: covid-19 testing, advised to quarantine for 5 days then leave home with mask for an additional 5 days as her symptoms could be covid-19.   I discussed the assessment and treatment plan with the patient. The patient was provided an opportunity to ask questions and all were answered. The patient agreed with the plan and demonstrated an understanding of the instructions.   The patient was advised to call back or seek an in-person evaluation if the symptoms worsen or if the condition fails to improve as anticipated.  Myrlene Broker, MD

## 2020-06-02 NOTE — Progress Notes (Signed)
Covid test scheduled for 1040.  Directions to practice & arrival instructions given.  Pt verb understanding.

## 2020-06-05 ENCOUNTER — Encounter: Payer: 59 | Admitting: Internal Medicine

## 2020-06-06 LAB — NOVEL CORONAVIRUS, NAA: SARS-CoV-2, NAA: NOT DETECTED

## 2020-06-08 ENCOUNTER — Other Ambulatory Visit: Payer: Self-pay

## 2020-06-08 ENCOUNTER — Telehealth: Payer: Self-pay | Admitting: Internal Medicine

## 2020-06-08 ENCOUNTER — Encounter: Payer: 59 | Admitting: Internal Medicine

## 2020-06-08 MED ORDER — LOSARTAN POTASSIUM-HCTZ 100-25 MG PO TABS: 1.0000 | ORAL_TABLET | Freq: Every day | ORAL | 0 refills | Status: DC

## 2020-06-08 NOTE — Telephone Encounter (Signed)
Refill sent. See meds.  

## 2020-06-08 NOTE — Telephone Encounter (Signed)
Patient was scheduled to get her medication refilled but was unable to wait at her appointment scheduled on 1.13.2022.   She is requesting a refill to hold her over until her appointment on 1.20.2022.   Please advise.

## 2020-06-15 ENCOUNTER — Other Ambulatory Visit: Payer: Self-pay

## 2020-06-15 ENCOUNTER — Ambulatory Visit (INDEPENDENT_AMBULATORY_CARE_PROVIDER_SITE_OTHER): Payer: 59 | Admitting: Internal Medicine

## 2020-06-15 ENCOUNTER — Encounter: Payer: Self-pay | Admitting: Internal Medicine

## 2020-06-15 VITALS — BP 134/78 | HR 100 | Temp 99.0°F | Resp 18 | Ht 62.0 in | Wt 264.0 lb

## 2020-06-15 DIAGNOSIS — R7303 Prediabetes: Secondary | ICD-10-CM

## 2020-06-15 DIAGNOSIS — J410 Simple chronic bronchitis: Secondary | ICD-10-CM | POA: Diagnosis not present

## 2020-06-15 DIAGNOSIS — G4733 Obstructive sleep apnea (adult) (pediatric): Secondary | ICD-10-CM

## 2020-06-15 DIAGNOSIS — Z0001 Encounter for general adult medical examination with abnormal findings: Secondary | ICD-10-CM | POA: Diagnosis not present

## 2020-06-15 DIAGNOSIS — I10 Essential (primary) hypertension: Secondary | ICD-10-CM

## 2020-06-15 DIAGNOSIS — M25512 Pain in left shoulder: Secondary | ICD-10-CM | POA: Insufficient documentation

## 2020-06-15 DIAGNOSIS — Z7289 Other problems related to lifestyle: Secondary | ICD-10-CM

## 2020-06-15 DIAGNOSIS — Z789 Other specified health status: Secondary | ICD-10-CM | POA: Insufficient documentation

## 2020-06-15 DIAGNOSIS — F1020 Alcohol dependence, uncomplicated: Secondary | ICD-10-CM | POA: Insufficient documentation

## 2020-06-15 DIAGNOSIS — G8929 Other chronic pain: Secondary | ICD-10-CM | POA: Insufficient documentation

## 2020-06-15 LAB — CBC
HCT: 40.4 % (ref 36.0–46.0)
Hemoglobin: 13.8 g/dL (ref 12.0–15.0)
MCHC: 34.2 g/dL (ref 30.0–36.0)
MCV: 90.7 fl (ref 78.0–100.0)
Platelets: 220 10*3/uL (ref 150.0–400.0)
RBC: 4.45 Mil/uL (ref 3.87–5.11)
RDW: 14 % (ref 11.5–15.5)
WBC: 8.7 10*3/uL (ref 4.0–10.5)

## 2020-06-15 LAB — COMPREHENSIVE METABOLIC PANEL
ALT: 22 U/L (ref 0–35)
AST: 17 U/L (ref 0–37)
Albumin: 4.5 g/dL (ref 3.5–5.2)
Alkaline Phosphatase: 50 U/L (ref 39–117)
BUN: 13 mg/dL (ref 6–23)
CO2: 33 mEq/L — ABNORMAL HIGH (ref 19–32)
Calcium: 10.1 mg/dL (ref 8.4–10.5)
Chloride: 101 mEq/L (ref 96–112)
Creatinine, Ser: 0.95 mg/dL (ref 0.40–1.20)
GFR: 78.57 mL/min (ref >60.00)
Glucose, Bld: 114 mg/dL — ABNORMAL HIGH (ref 70–99)
Potassium: 3.5 mEq/L (ref 3.5–5.1)
Sodium: 140 mEq/L (ref 135–145)
Total Bilirubin: 0.4 mg/dL (ref 0.2–1.2)
Total Protein: 7.6 g/dL (ref 6.0–8.3)

## 2020-06-15 LAB — LIPID PANEL
Cholesterol: 153 mg/dL (ref 0–200)
HDL: 42 mg/dL (ref >39.00)
NonHDL: 110.54
Total CHOL/HDL Ratio: 4
Triglycerides: 328 mg/dL — ABNORMAL HIGH (ref 0.0–149.0)
VLDL: 65.6 mg/dL — ABNORMAL HIGH (ref 0.0–40.0)

## 2020-06-15 LAB — LDL CHOLESTEROL, DIRECT: Direct LDL: 93 mg/dL

## 2020-06-15 LAB — HEMOGLOBIN A1C: Hgb A1c MFr Bld: 7.1 % — ABNORMAL HIGH (ref 4.6–6.5)

## 2020-06-15 MED ORDER — METHYLPREDNISOLONE ACETATE 40 MG/ML IJ SUSP
40.0000 mg | Freq: Once | INTRAMUSCULAR | Status: AC
  Administered 2020-06-15: 40 mg via INTRAMUSCULAR

## 2020-06-15 NOTE — Assessment & Plan Note (Signed)
Checking HgA1c and adjust as needed.  

## 2020-06-15 NOTE — Assessment & Plan Note (Signed)
Flu shot declines. Tetanus declines. Pap smear with gyn. Counseled about sun safety and mole surveillance. Counseled about the dangers of distracted driving. Given 10 year screening recommendations.

## 2020-06-15 NOTE — Assessment & Plan Note (Signed)
BP at goal on losartan/hctz 100/25 mg checking CMP and adjust as needed.

## 2020-06-15 NOTE — Assessment & Plan Note (Signed)
Still smoking and slightly more than usual as she is drinking more than usual and smokes with drinking and also when alone at home. Does not smoke around others or at work. Advised to quit and she feels unable to make a quit attempt currently.

## 2020-06-15 NOTE — Progress Notes (Signed)
   Subjective:   Patient ID: Deanna Richards, female    DOB: 07-Jun-1985, 35 y.o.   MRN: [id number]  HPI The patient is a 35 YO female coming in for physical and concerns.   PMH, [facility], social history reviewed and updated  HPI #2: Here for new left shoulder pain, started Sunday night. She thought she slept on it wrong and it was mild at first. As the next few days progressed the pain worsened. Some limitation of ROM and currently not able to work due to the pain. Denies numbness or tingling down her arm. Pain in the shoulder and going into the left neck. Denies swelling. Denies known injury and she is careful as she has past problems with her back.   Review of Systems  Constitutional: Negative.   HENT: Negative.   Eyes: Negative.   Respiratory: Negative for cough, chest tightness and shortness of breath.   Cardiovascular: Negative for chest pain, palpitations and leg swelling.  Gastrointestinal: Negative for abdominal distention, abdominal pain, constipation, diarrhea, nausea and vomiting.  Musculoskeletal: Positive for arthralgias and myalgias.  Skin: Negative.   Neurological: Negative.   Psychiatric/Behavioral: Negative.     Objective:  Physical Exam Constitutional:      Appearance: She is well-developed and well-nourished. She is obese.  HENT:     Head: Normocephalic and atraumatic.  Eyes:     Extraocular Movements: EOM normal.  Cardiovascular:     Rate and Rhythm: Normal rate and regular rhythm.  Pulmonary:     Effort: Pulmonary effort is normal. No respiratory distress.     Breath sounds: Normal breath sounds. No wheezing or rales.  Abdominal:     General: Bowel sounds are normal. There is no distension.     Palpations: Abdomen is soft.     Tenderness: There is no abdominal tenderness. There is no rebound.  Musculoskeletal:        General: Tenderness present. No edema.     Cervical back: Normal range of motion.     Comments: Pain left AC joint  Skin:    General: Skin  is warm and dry.  Neurological:     Mental Status: She is alert and oriented to person, place, and time.     Coordination: Coordination normal.  Psychiatric:        Mood and Affect: Mood and affect normal.     Vitals:   06/15/20 1319  BP: 134/78  Pulse: 100  Resp: 18  Temp: 99 F (37.2 C)  TempSrc: Oral  SpO2: 95%  Weight: 264 lb (119.7 kg)  Height: 5\' 2"  (1.575 m)   This visit occurred during the SARS-CoV-2 public health emergency.  Safety protocols were in place, including screening questions prior to the visit, additional usage of staff PPE, and extensive cleaning of exam room while observing appropriate contact time as indicated for disinfecting solutions.   Assessment & Plan:  Depo-medrol 40 mg IM given at visit

## 2020-06-15 NOTE — Assessment & Plan Note (Signed)
Is drinking more than average with pandemic and we discussed cutting back to help with weight and overall health.

## 2020-06-15 NOTE — Assessment & Plan Note (Signed)
Given depo-medrol 40 mg IM and likely sprain. Work note done.

## 2020-06-15 NOTE — Assessment & Plan Note (Signed)
Willing to start CPAP and this was ordered today.

## 2020-06-15 NOTE — Assessment & Plan Note (Signed)
Weight up and she is advised to cut back on alcohol as this affects weight.

## 2020-06-15 NOTE — Patient Instructions (Addendum)
We have given you the shot today for the left shoulder and are checking the labs today.  Work on cutting back on the alcohol and the smoking if possible.    Health Maintenance, Female Adopting a healthy lifestyle and getting preventive care are important in promoting health and wellness. Ask your health care provider about:  The right schedule for you to have regular tests and exams.  Things you can do on your own to prevent diseases and keep yourself healthy. What should I know about diet, weight, and exercise? Eat a healthy diet  Eat a diet that includes plenty of vegetables, fruits, low-fat dairy products, and lean protein.  Do not eat a lot of foods that are high in solid fats, added sugars, or sodium.   Maintain a healthy weight Body mass index (BMI) is used to identify weight problems. It estimates body fat based on height and weight. Your health care provider can help determine your BMI and help you achieve or maintain a healthy weight. Get regular exercise Get regular exercise. This is one of the most important things you can do for your health. Most adults should:  Exercise for at least 150 minutes each week. The exercise should increase your heart rate and make you sweat (moderate-intensity exercise).  Do strengthening exercises at least twice a week. This is in addition to the moderate-intensity exercise.  Spend less time sitting. Even light physical activity can be beneficial. Watch cholesterol and blood lipids Have your blood tested for lipids and cholesterol at 34 years of age, then have this test every 5 years. Have your cholesterol levels checked more often if:  Your lipid or cholesterol levels are high.  You are older than 34 years of age.  You are at high risk for heart disease. What should I know about cancer screening? Depending on your health history and family history, you may need to have cancer screening at various ages. This may include screening  for:  Breast cancer.  Cervical cancer.  Colorectal cancer.  Skin cancer.  Lung cancer. What should I know about heart disease, diabetes, and high blood pressure? Blood pressure and heart disease  High blood pressure causes heart disease and increases the risk of stroke. This is more likely to develop in people who have high blood pressure readings, are of African descent, or are overweight.  Have your blood pressure checked: ? Every 3-5 years if you are 65-51 years of age. ? Every year if you are 88 years old or older. Diabetes Have regular diabetes screenings. This checks your fasting blood sugar level. Have the screening done:  Once every three years after age 57 if you are at a normal weight and have a low risk for diabetes.  More often and at a younger age if you are overweight or have a high risk for diabetes. What should I know about preventing infection? Hepatitis B If you have a higher risk for hepatitis B, you should be screened for this virus. Talk with your health care provider to find out if you are at risk for hepatitis B infection. Hepatitis C Testing is recommended for:  Everyone born from [birth year] through 1964.  Anyone with known risk factors for hepatitis C. Sexually transmitted infections (STIs)  Get screened for STIs, including gonorrhea and chlamydia, if: ? You are sexually active and are younger than 34 years of age. ? You are older than 33 years of age and your health care provider tells you that you are  at risk for this type of infection. ? Your sexual activity has changed since you were last screened, and you are at increased risk for chlamydia or gonorrhea. Ask your health care provider if you are at risk.  Ask your health care provider about whether you are at high risk for HIV. Your health care provider may recommend a prescription medicine to help prevent HIV infection. If you choose to take medicine to prevent HIV, you should first get tested for HIV.  You should then be tested every 3 months for as long as you are taking the medicine. Pregnancy  If you are about to stop having your period (premenopausal) and you may become pregnant, seek counseling before you get pregnant.  Take 400 to 800 micrograms (mcg) of folic acid every day if you become pregnant.  Ask for birth control (contraception) if you want to prevent pregnancy. Osteoporosis and menopause Osteoporosis is a disease in which the bones lose minerals and strength with aging. This can result in bone fractures. If you are 11 years old or older, or if you are at risk for osteoporosis and fractures, ask your health care provider if you should:  Be screened for bone loss.  Take a calcium or vitamin D supplement to lower your risk of fractures.  Be given hormone replacement therapy (HRT) to treat symptoms of menopause. Follow these instructions at home: Lifestyle  Do not use any products that contain nicotine or tobacco, such as cigarettes, e-cigarettes, and chewing tobacco. If you need help quitting, ask your health care provider.  Do not use street drugs.  Do not share needles.  Ask your health care provider for help if you need support or information about quitting drugs. Alcohol use  Do not drink alcohol if: ? Your health care provider tells you not to drink. ? You are pregnant, may be pregnant, or are planning to become pregnant.  If you drink alcohol: ? Limit how much you use to 0-1 drink a day. ? Limit intake if you are breastfeeding.  Be aware of how much alcohol is in your drink. In the [place] one drink equals one 12 oz bottle of beer (355 mL), one 5 oz glass of wine (148 mL), or one 1 oz glass of hard liquor (44 mL). General instructions  Schedule regular health, dental, and eye exams.  Stay current with your vaccines.  Tell your health care provider if: ? You often feel depressed. ? You have ever been abused or do not feel safe at  home. Summary  Adopting a healthy lifestyle and getting preventive care are important in promoting health and wellness.  Follow your health care provider's instructions about healthy diet, exercising, and getting tested or screened for diseases.  Follow your health care provider's instructions on monitoring your cholesterol and blood pressure. This information is not intended to replace advice given to you by your health care provider. Make sure you discuss any questions you have with your health care provider. Document Revised: 05/06/2018 Document Reviewed: 05/06/2018 Elsevier Patient Education  2021 [employer].

## 2020-06-19 ENCOUNTER — Other Ambulatory Visit: Payer: Self-pay | Admitting: Internal Medicine

## 2020-06-19 MED ORDER — SIMVASTATIN 20 MG PO TABS: 20.0000 mg | ORAL_TABLET | Freq: Every day | ORAL | 3 refills | Status: DC

## 2020-06-19 NOTE — Addendum Note (Signed)
Addended by: Manuela Schwartz on: 06/19/2020 09:17 AM   Modules accepted: Orders

## 2020-07-29 ENCOUNTER — Other Ambulatory Visit: Payer: Self-pay | Admitting: Internal Medicine

## 2020-07-31 NOTE — Telephone Encounter (Signed)
   Patient requesting refill today, no medication remaiing

## 2020-09-28 ENCOUNTER — Other Ambulatory Visit: Payer: Self-pay | Admitting: Family Medicine

## 2020-10-28 ENCOUNTER — Other Ambulatory Visit: Payer: Self-pay | Admitting: Internal Medicine

## 2020-11-20 ENCOUNTER — Telehealth: Payer: Self-pay | Admitting: Internal Medicine

## 2020-11-20 NOTE — Telephone Encounter (Signed)
Can use tylenol for fever or pain. Needs to quarantine 5 days from symptom onset and 10 days needs to wear mask while out of home.

## 2020-11-20 NOTE — Telephone Encounter (Signed)
Spoke with the patient and she verbalized understanding the instructions given by Dr. Okey Dupre. No other questions or concerns at this time.

## 2020-11-20 NOTE — Telephone Encounter (Signed)
   Patient calling to report home covid test positive over the weekend. She states she did have a fever, body ache, headache and fatigue. She states she feels some better today.  Declined virtual visit   Seeking advice

## 2020-11-20 NOTE — Telephone Encounter (Signed)
See below

## 2020-12-04 ENCOUNTER — Encounter: Payer: Self-pay | Admitting: Internal Medicine

## 2020-12-05 ENCOUNTER — Telehealth (INDEPENDENT_AMBULATORY_CARE_PROVIDER_SITE_OTHER): Payer: 59 | Admitting: Internal Medicine

## 2020-12-05 ENCOUNTER — Encounter: Payer: Self-pay | Admitting: Internal Medicine

## 2020-12-05 DIAGNOSIS — J329 Chronic sinusitis, unspecified: Secondary | ICD-10-CM | POA: Insufficient documentation

## 2020-12-05 DIAGNOSIS — U071 COVID-19: Secondary | ICD-10-CM | POA: Insufficient documentation

## 2020-12-05 DIAGNOSIS — J0110 Acute frontal sinusitis, unspecified: Secondary | ICD-10-CM | POA: Diagnosis not present

## 2020-12-05 MED ORDER — BENZONATATE 200 MG PO CAPS: 200.0000 mg | ORAL_CAPSULE | Freq: Three times a day (TID) | ORAL | 0 refills | Status: DC | PRN

## 2020-12-05 MED ORDER — PREDNISONE 20 MG PO TABS: 40.0000 mg | ORAL_TABLET | Freq: Every day | ORAL | 0 refills | Status: DC

## 2020-12-05 MED ORDER — AMOXICILLIN-POT CLAVULANATE 875-125 MG PO TABS: 1.0000 | ORAL_TABLET | Freq: Two times a day (BID) | ORAL | 0 refills | Status: AC

## 2020-12-05 NOTE — Assessment & Plan Note (Signed)
Extended course of illness and suspect superimposed sinus infection. Rx augmentin and prednisone and tessalon perles. Call back if no improvement or if worsening.

## 2020-12-05 NOTE — Progress Notes (Signed)
Virtual Visit via Video Note  I connected with Deanna Richards on 12/05/20 at  9:00 AM EDT by a video enabled telemedicine application and verified that I am speaking with the correct person using two identifiers.  The patient and the provider were at separate locations throughout the entire encounter. Patient location: home, Provider location: work   I discussed the limitations of evaluation and management by telemedicine and the availability of in person appointments. The patient expressed understanding and agreed to proceed. The patient and the provider were the only parties present for the visit unless noted in HPI below.  History of Present Illness: The patient is a 35 y.o. female with visit for covid-19. Started 11/18/20. Finished quarantine and then started feeling bad again. Still running fevers and having SOB. Re-tested at ER 11/30/20 and positive. She is still having cough, SOB, fevers, sinus congestion and overall this is worsening. Has taken otc and rx cough medicine.  Observations/Objective: Appearance: looks sick, breathing appears normal, speaking in full sentences, SOB with moving, casual grooming, abdomen does not appear distended, throat not well visualized, mental status is A and O times 3  Assessment and Plan: See problem oriented charting  Follow Up Instructions: rx augmentin, prednisone, tessalon perles  I discussed the assessment and treatment plan with the patient. The patient was provided an opportunity to ask questions and all were answered. The patient agreed with the plan and demonstrated an understanding of the instructions.   The patient was advised to call back or seek an in-person evaluation if the symptoms worsen or if the condition fails to improve as anticipated.  Myrlene Broker, MD

## 2020-12-05 NOTE — Assessment & Plan Note (Signed)
Rx augmentin and suspect covid-19 led to development of this. Symptoms for >3 weeks now.

## 2020-12-07 ENCOUNTER — Encounter: Payer: Self-pay | Admitting: Internal Medicine

## 2020-12-12 NOTE — Telephone Encounter (Signed)
Pt called to set up in person appt.  VV on 7/12 with Dr Okey Dupre instructed pt seek in-person evaluation if symptoms worsened of if the condition failed to improve.  Appt made for 7/20 with Dr Alvy Bimler.

## 2020-12-13 ENCOUNTER — Encounter: Payer: Self-pay | Admitting: Emergency Medicine

## 2020-12-13 ENCOUNTER — Other Ambulatory Visit: Payer: Self-pay

## 2020-12-13 ENCOUNTER — Ambulatory Visit: Payer: 59 | Admitting: Emergency Medicine

## 2020-12-13 VITALS — BP 130/70 | HR 96 | Temp 98.5°F | Resp 18 | Ht 62.0 in | Wt 252.6 lb

## 2020-12-13 DIAGNOSIS — H65112 Acute and subacute allergic otitis media (mucoid) (sanguinous) (serous), left ear: Secondary | ICD-10-CM | POA: Diagnosis not present

## 2020-12-13 DIAGNOSIS — I10 Essential (primary) hypertension: Secondary | ICD-10-CM

## 2020-12-13 MED ORDER — AZITHROMYCIN 250 MG PO TABS: ORAL_TABLET | ORAL | 0 refills | Status: DC

## 2020-12-13 NOTE — Patient Instructions (Signed)
Otitis Media, Adult  Otitis media is a condition in which the middle ear is red and swollen (inflamed) and full of fluid. The middle ear is the part of the ear that contains bones for hearing as well as air that helps send sounds to the brain. The conditionusually goes away on its own. What are the causes? This condition is caused by a blockage in the eustachian tube. The eustachian tube connects the middle ear to the back of the nose. It normally allows air into the middle ear. The blockage is caused by fluid or swelling. Problems that can cause blockage include: A cold or infection that affects the nose, mouth, or throat. Allergies. An irritant, such as tobacco smoke. Adenoids that have become large. The adenoids are soft tissue located in the back of the throat, behind the nose and the roof of the mouth. Growth or swelling in the upper part of the throat, just behind the nose (nasopharynx). Damage to the ear caused by change in pressure. This is called barotrauma. What are the signs or symptoms? Symptoms of this condition include: Ear pain. Fever. Problems with hearing. Being tired. Fluid leaking from the ear. Ringing in the ear. How is this treated? This condition can go away on its own within 3-5 days. But if the condition is caused by bacteria or does not go away on its own, or if it keeps coming back, your doctor may: Give you antibiotic medicines. Give you medicines for pain. Follow these instructions at home: Take over-the-counter and prescription medicines only as told by your doctor. If you were prescribed an antibiotic medicine, take it as told by your doctor. Do not stop taking the antibiotic even if you start to feel better. Keep all follow-up visits as told by your doctor. This is important. Contact a doctor if: You have bleeding from your nose. There is a lump on your neck. You are not feeling better in 5 days. You feel worse instead of better. Get help right away  if: You have pain that is not helped with medicine. You have swelling, redness, or pain around your ear. You get a stiff neck. You cannot move part of your face (paralysis). You notice that the bone behind your ear hurts when you touch it. You get a very bad headache. Summary Otitis media means that the middle ear is red, swollen, and full of fluid. This condition usually goes away on its own. If the problem does not go away, treatment may be needed. You may be given medicines to treat the infection or to treat your pain. If you were prescribed an antibiotic medicine, take it as told by your doctor. Do not stop taking the antibiotic even if you start to feel better. Keep all follow-up visits as told by your doctor. This is important. This information is not intended to replace advice given to you by your health care provider. Make sure you discuss any questions you have with your healthcare provider. Document Revised: 04/15/2019 Document Reviewed: 04/15/2019 Elsevier Patient Education  2022 [employer]  

## 2020-12-13 NOTE — Progress Notes (Signed)
Deanna Richards 35 y.o.   Chief Complaint  Patient presents with   Possible Ear Infection    Mucus lodged in her left ear. She tried home remedies to get the mucus out but it didn't work. Balance was a little off yesterday, she almost passed out. Muffled hearing.     HISTORY OF PRESENT ILLNESS: This is a 35 y.o. female complaining of left ear congestion. Had COVID infection June 24th.  Recovered well in a couple days.  However relapsed on July 5th with flulike symptoms. Telemedicine visit with Dr. Okey Dupre last week.  Started on amoxicillin and prednisone.  Today is her last day of amoxicillin.  Finished prednisone.  Also taking Mucinex. Still having left ear congestion with intermittent balance issues and discomfort in left ear with muffled hearing. No other complaints or medical concerns today.  HPI   Prior to Admission medications   Medication Sig Start Date End Date Taking? Authorizing Provider  folic acid (FOLVITE) 1 MG tablet Take 1 mg by mouth daily.   Yes [provider]  losartan-hydrochlorothiazide (HYZAAR) 100-25 MG tablet Take 1 tablet by mouth once daily 10/30/20  Yes Myrlene Broker, MD  norethindrone (AYGESTIN) 5 MG tablet Take 1 tablet by mouth every 30 (thirty) days. 04/16/19  Yes [provider]  simvastatin (ZOCOR) 20 MG tablet Take 1 tablet (20 mg total) by mouth at bedtime. 06/19/20  Yes Myrlene Broker, MD  albuterol (PROVENTIL HFA;VENTOLIN HFA) 108 (90 Base) MCG/ACT inhaler Inhale 2 puffs into the lungs every 6 (six) hours as needed for wheezing or shortness of breath. Patient not taking: Reported on 12/13/2020 07/08/17   Myrlene Broker, MD  benzonatate (TESSALON) 200 MG capsule Take 1 capsule (200 mg total) by mouth 3 (three) times daily as needed. Patient not taking: Reported on 12/13/2020 12/05/20   Myrlene Broker, MD  Diclofenac Sodium 2 % SOLN Place 2 g onto the skin 2 (two) times daily. Patient not taking: Reported on  12/13/2020 01/19/20   Judi Saa, DO  fluticasone [facility]) 50 MCG/ACT nasal spray Place 2 sprays into both nostrils daily. Patient not taking: Reported on 12/13/2020 09/18/17   Myrlene Broker, MD  meloxicam (MOBIC) 15 MG tablet Take 1 tablet (15 mg total) by mouth daily. Patient not taking: Reported on 12/13/2020 09/14/19   Judi Saa, DO  nitroGLYCERIN (NITRODUR - DOSED IN MG/24 HR) 0.2 mg/hr patch 1/4 patch daily Patient not taking: Reported on 12/13/2020 07/08/18   Judi Saa, DO  Vitamin D, Ergocalciferol, (DRISDOL) 1.25 MG (50000 UNIT) CAPS capsule Take 1 capsule (50,000 Units total) by mouth once a week. Patient not taking: Reported on 12/13/2020 09/14/19   Judi Saa, DO    No Known Allergies  Patient Active Problem List   Diagnosis Date Noted   COVID-19 12/05/2020   Sinusitis 12/05/2020   Acute pain of left shoulder 06/15/2020   Alcohol use 06/15/2020   Biceps tendinitis of right shoulder 01/18/2020   Pre-diabetes 04/21/2019   OSA (obstructive sleep apnea) 07/10/2017   Low back pain 05/24/2016   Allergic rhinitis 01/31/2016   Encounter for general adult medical examination with abnormal findings 09/05/2015   Nonallopathic lesion of sacral region 11/21/2014   Nonallopathic lesion of thoracic region 11/21/2014   Nonallopathic lesion of lumbosacral region 11/21/2014   INSOMNIA 05/29/2010   Morbid obesity (HCC) 02/22/2010   Smokers' cough (HCC) 02/22/2010   Essential hypertension 02/22/2010    Past Medical History:  Diagnosis Date  Carpal tunnel syndrome of right wrist 10/2013   PCOS (polycystic ovarian syndrome)    no current med.    Past Surgical History:  Procedure Laterality Date   CARPAL TUNNEL RELEASE Right 11/05/2013   Procedure: CARPAL TUNNEL RELEASE ENDOSCOPIC RIGHT WRIST;  Surgeon: Sheral Apley, MD;  Location: [facility];  Service: Orthopedics;  Laterality: Right;   NO PAST SURGERIES      Social History    Socioeconomic History   Marital status: Single    Spouse name: Not on file   Number of children: Not on file   Years of education: Not on file   Highest education level: Not on file  Occupational History   Not on file  Tobacco Use   Smoking status: Every Day    Packs/day: 0.30    Years: 5.00    Pack years: 1.50    Types: Cigars, Cigarettes   Smokeless tobacco: Never   Tobacco comments:    1-2 Black and Mild/day  Substance and Sexual Activity   Alcohol use: Yes    Comment: 2-4 daily drinks   Drug use: No   Sexual activity: Yes    Birth control/protection: None, Pill  Other Topics Concern   Not on file  Social History Narrative   Not on file   Social Determinants of Health   Financial Resource Strain: Not on file  Food Insecurity: Not on file  Transportation Needs: Not on file  Physical Activity: Not on file  Stress: Not on file  Social Connections: Not on file  Intimate Partner Violence: Not on file    Family History  Adopted: Yes  Problem Relation Age of Onset   Hypertension Mother      Review of Systems  Constitutional: Negative.  Negative for chills and fever.  HENT:  Positive for congestion, ear pain and hearing loss. Negative for sore throat.   Respiratory: Negative.  Negative for cough and shortness of breath.   Cardiovascular: Negative.  Negative for chest pain and palpitations.  Gastrointestinal: Negative.  Negative for abdominal pain, diarrhea, nausea and vomiting.  Genitourinary: Negative.  Negative for dysuria and hematuria.  Skin: Negative.  Negative for rash.  Neurological: Negative.  Negative for dizziness and headaches.  All other systems reviewed and are negative.   Physical Exam Vitals reviewed.  Constitutional:      Appearance: Normal appearance. She is obese.  HENT:     Head: Normocephalic.     Right Ear: Tympanic membrane, ear canal and external ear normal.     Left Ear: External ear normal. A middle ear effusion is present. No  mastoid tenderness. Tympanic membrane is injected and erythematous.     Mouth/Throat:     Mouth: Mucous membranes are moist.     Pharynx: Oropharynx is clear.  Eyes:     Extraocular Movements: Extraocular movements intact.     Conjunctiva/sclera: Conjunctivae normal.     Pupils: Pupils are equal, round, and reactive to light.  Cardiovascular:     Rate and Rhythm: Normal rate and regular rhythm.     Pulses: Normal pulses.     Heart sounds: Normal heart sounds.  Pulmonary:     Effort: Pulmonary effort is normal.     Breath sounds: Normal breath sounds.  Musculoskeletal:        General: Normal range of motion.     Cervical back: Normal range of motion and neck supple. No tenderness.  Lymphadenopathy:     Cervical: No cervical adenopathy.  Skin:    General: Skin is warm and dry.  Neurological:     General: No focal deficit present.     Mental Status: She is alert and oriented to person, place, and time.  Psychiatric:        Mood and Affect: Mood normal.        Behavior: Behavior normal.     ASSESSMENT & PLAN: Damiah was seen today for possible ear infection.  Diagnoses and all orders for this visit:  Acute mucoid otitis media of left ear -     azithromycin (ZITHROMAX) 250 MG tablet; Sig as indicated  Essential hypertension  Recent COVID infection.  Left otitis media bacterial infection unresponsive to amoxicillin. Take Z-Pak as prescribed.  Follow-up with Dr. Okey Dupre next week if not better.  Patient Instructions  Otitis Media, Adult  Otitis media is a condition in which the middle ear is red and swollen (inflamed) and full of fluid. The middle ear is the part of the ear that contains bones for hearing as well as air that helps send sounds to the brain. The conditionusually goes away on its own. What are the causes? This condition is caused by a blockage in the eustachian tube. The eustachian tube connects the middle ear to the back of the nose. It normally allows air  into the middle ear. The blockage is caused by fluid or swelling. Problems that can cause blockage include: A cold or infection that affects the nose, mouth, or throat. Allergies. An irritant, such as tobacco smoke. Adenoids that have become large. The adenoids are soft tissue located in the back of the throat, behind the nose and the roof of the mouth. Growth or swelling in the upper part of the throat, just behind the nose (nasopharynx). Damage to the ear caused by change in pressure. This is called barotrauma. What are the signs or symptoms? Symptoms of this condition include: Ear pain. Fever. Problems with hearing. Being tired. Fluid leaking from the ear. Ringing in the ear. How is this treated? This condition can go away on its own within 3-5 days. But if the condition is caused by bacteria or does not go away on its own, or if it keeps coming back, your doctor may: Give you antibiotic medicines. Give you medicines for pain. Follow these instructions at home: Take over-the-counter and prescription medicines only as told by your doctor. If you were prescribed an antibiotic medicine, take it as told by your doctor. Do not stop taking the antibiotic even if you start to feel better. Keep all follow-up visits as told by your doctor. This is important. Contact a doctor if: You have bleeding from your nose. There is a lump on your neck. You are not feeling better in 5 days. You feel worse instead of better. Get help right away if: You have pain that is not helped with medicine. You have swelling, redness, or pain around your ear. You get a stiff neck. You cannot move part of your face (paralysis). You notice that the bone behind your ear hurts when you touch it. You get a very bad headache. Summary Otitis media means that the middle ear is red, swollen, and full of fluid. This condition usually goes away on its own. If the problem does not go away, treatment may be needed. You  may be given medicines to treat the infection or to treat your pain. If you were prescribed an antibiotic medicine, take it as told by your doctor. Do not  stop taking the antibiotic even if you start to feel better. Keep all follow-up visits as told by your doctor. This is important. This information is not intended to replace advice given to you by your health care provider. Make sure you discuss any questions you have with your healthcare provider. Document Revised: 04/15/2019 Document Reviewed: 04/15/2019 Elsevier Patient Education  2022 Elsevier Inc.    Edwina BarthMiguel Abednego Yeates, MD [facility]

## 2020-12-18 ENCOUNTER — Ambulatory Visit: Payer: 59 | Admitting: Internal Medicine

## 2021-01-01 ENCOUNTER — Encounter: Payer: Self-pay | Admitting: Internal Medicine

## 2021-01-18 ENCOUNTER — Ambulatory Visit: Payer: 59 | Admitting: Internal Medicine

## 2021-01-30 ENCOUNTER — Other Ambulatory Visit: Payer: Self-pay | Admitting: Internal Medicine

## 2021-02-05 ENCOUNTER — Ambulatory Visit: Payer: 59 | Admitting: Internal Medicine

## 2021-02-27 ENCOUNTER — Ambulatory Visit: Payer: 59 | Admitting: Internal Medicine

## 2021-02-27 ENCOUNTER — Encounter: Payer: Self-pay | Admitting: Internal Medicine

## 2021-02-27 ENCOUNTER — Other Ambulatory Visit: Payer: Self-pay

## 2021-02-27 VITALS — BP 130/80 | HR 93 | Temp 98.4°F | Resp 18 | Ht 62.0 in | Wt 257.6 lb

## 2021-02-27 DIAGNOSIS — E119 Type 2 diabetes mellitus without complications: Secondary | ICD-10-CM | POA: Diagnosis not present

## 2021-02-27 DIAGNOSIS — E118 Type 2 diabetes mellitus with unspecified complications: Secondary | ICD-10-CM | POA: Diagnosis not present

## 2021-02-27 DIAGNOSIS — R7303 Prediabetes: Secondary | ICD-10-CM

## 2021-02-27 LAB — POCT GLYCOSYLATED HEMOGLOBIN (HGB A1C): Hemoglobin A1C: 7.4 % — AB (ref 4.0–5.6)

## 2021-02-27 MED ORDER — ALBUTEROL SULFATE HFA 108 (90 BASE) MCG/ACT IN AERS: 2.0000 | INHALATION_SPRAY | Freq: Four times a day (QID) | RESPIRATORY_TRACT | 0 refills | Status: DC | PRN

## 2021-02-27 NOTE — Progress Notes (Signed)
   Subjective:   Patient ID: Deanna Richards, female    DOB: Aug 09, 1985, 35 y.o.   MRN: [id number]  HPI The patient is a 35 YO female coming in for new diagnosis of diabetes.   Review of Systems  Constitutional: Negative.   HENT: Negative.    Eyes: Negative.   Respiratory:  Negative for cough, chest tightness and shortness of breath.   Cardiovascular:  Negative for chest pain, palpitations and leg swelling.  Gastrointestinal:  Negative for abdominal distention, abdominal pain, constipation, diarrhea, nausea and vomiting.  Musculoskeletal: Negative.   Skin: Negative.   Neurological: Negative.   Psychiatric/Behavioral: Negative.     Objective:  Physical Exam Constitutional:      Appearance: She is well-developed. She is obese.  HENT:     Head: Normocephalic and atraumatic.  Cardiovascular:     Rate and Rhythm: Normal rate and regular rhythm.  Pulmonary:     Effort: Pulmonary effort is normal. No respiratory distress.     Breath sounds: Normal breath sounds. No wheezing or rales.  Abdominal:     General: Bowel sounds are normal. There is no distension.     Palpations: Abdomen is soft.     Tenderness: There is no abdominal tenderness. There is no rebound.  Musculoskeletal:     Cervical back: Normal range of motion.  Skin:    General: Skin is warm and dry.  Neurological:     Mental Status: She is alert and oriented to person, place, and time.     Coordination: Coordination normal.    Vitals:   02/27/21 1028  BP: 130/80  Pulse: 93  Resp: 18  Temp: 98.4 F (36.9 C)  TempSrc: Oral  SpO2: 99%  Weight: 257 lb 9.6 oz (116.8 kg)  Height: 5\' 2"  (1.575 m)    This visit occurred during the SARS-CoV-2 public health emergency.  Safety protocols were in place, including screening questions prior to the visit, additional usage of staff PPE, and extensive cleaning of exam room while observing appropriate contact time as indicated for disinfecting solutions.   Assessment & Plan:

## 2021-02-27 NOTE — Patient Instructions (Signed)
We will get you to talk to a nutritionist.   Your sugars are high today and you do now have diabetes.

## 2021-03-02 ENCOUNTER — Encounter: Payer: Self-pay | Admitting: Internal Medicine

## 2021-03-02 NOTE — Assessment & Plan Note (Signed)
Counseled about new diagnosis with POC HGA1c done today. Referral to nutrition to help with dietary changes. She is on ARB and statin although not taking her statin currently. Will reassess next visit in 3 months.

## 2021-05-04 ENCOUNTER — Other Ambulatory Visit: Payer: Self-pay | Admitting: Internal Medicine

## 2021-05-29 ENCOUNTER — Other Ambulatory Visit: Payer: Self-pay

## 2021-05-29 ENCOUNTER — Ambulatory Visit: Payer: 59 | Admitting: Internal Medicine

## 2021-05-29 ENCOUNTER — Encounter: Payer: Self-pay | Admitting: Internal Medicine

## 2021-05-29 VITALS — BP 130/78 | HR 61 | Resp 18 | Ht 62.0 in | Wt 255.0 lb

## 2021-05-29 DIAGNOSIS — I10 Essential (primary) hypertension: Secondary | ICD-10-CM | POA: Diagnosis not present

## 2021-05-29 DIAGNOSIS — J029 Acute pharyngitis, unspecified: Secondary | ICD-10-CM

## 2021-05-29 DIAGNOSIS — E118 Type 2 diabetes mellitus with unspecified complications: Secondary | ICD-10-CM

## 2021-05-29 DIAGNOSIS — R051 Acute cough: Secondary | ICD-10-CM

## 2021-05-29 LAB — POC COVID19 BINAXNOW: SARS Coronavirus 2 Ag: NEGATIVE

## 2021-05-29 LAB — POCT GLYCOSYLATED HEMOGLOBIN (HGB A1C): Hemoglobin A1C: 7.1 % — AB (ref 4.0–5.6)

## 2021-05-29 NOTE — Patient Instructions (Addendum)
Your HgA1c today is 7.1.   Take an extra 1/2 blood pressure pill about 30-60 minutes prior to the dentist.   You can take claritin or zyrtec for the throat and drainage this is likely viral.

## 2021-05-29 NOTE — Assessment & Plan Note (Signed)
Appearance not consistent with strep and clinical picture not consistent with strep as well. Rapid covid-19 testing done in office which was negative. Flu testing not available. Advised to try claritin or zyrtec for congestion to help with throat.

## 2021-05-29 NOTE — Progress Notes (Signed)
° °  Subjective:   Patient ID: Deanna Richards, female    DOB: 16-Mar-1986, 37 y.o.   MRN: [id number]  HPI The patient is a 36 YO female coming in for concerns about blood pressure and follow up sugars. Also having new sore throat and cough and SOB in the last day. SOB better today. Still feels as though throat is congested and if eating feels like she cannot breathe well.   Review of Systems  Constitutional:  Positive for activity change and appetite change. Negative for chills, fatigue, fever and unexpected weight change.  HENT:  Positive for congestion, postnasal drip, rhinorrhea and sinus pressure. Negative for ear discharge, ear pain, sinus pain, sneezing, sore throat, tinnitus, trouble swallowing and voice change.   Eyes: Negative.   Respiratory:  Positive for cough and shortness of breath. Negative for chest tightness and wheezing.   Cardiovascular: Negative.  Negative for chest pain, palpitations and leg swelling.  Gastrointestinal: Negative.  Negative for abdominal distention, abdominal pain, constipation, diarrhea, nausea and vomiting.  Musculoskeletal:  Negative for myalgias.  Skin: Negative.   Neurological: Negative.   Psychiatric/Behavioral: Negative.     Objective:  Physical Exam Constitutional:      Appearance: She is well-developed. She is obese.  HENT:     Head: Normocephalic and atraumatic.     Nose: Rhinorrhea present.     Mouth/Throat:     Mouth: Mucous membranes are moist.     Comments: Minimal drainage posterior oropharynx without redness or discharge Cardiovascular:     Rate and Rhythm: Normal rate and regular rhythm.  Pulmonary:     Effort: Pulmonary effort is normal. No respiratory distress.     Breath sounds: Normal breath sounds. No wheezing or rales.  Abdominal:     General: Bowel sounds are normal. There is no distension.     Palpations: Abdomen is soft.     Tenderness: There is no abdominal tenderness. There is no rebound.  Musculoskeletal:      Cervical back: Normal range of motion.  Skin:    General: Skin is warm and dry.  Neurological:     Mental Status: She is alert and oriented to person, place, and time.     Coordination: Coordination normal.    Vitals:   05/29/21 1059  BP: 130/78  Pulse: 61  Resp: 18  SpO2: 96%  Weight: 255 lb (115.7 kg)  Height: 5\' 2"  (1.575 m)    This visit occurred during the SARS-CoV-2 public health emergency.  Safety protocols were in place, including screening questions prior to the visit, additional usage of staff PPE, and extensive cleaning of exam room while observing appropriate contact time as indicated for disinfecting solutions.   Assessment & Plan:

## 2021-05-29 NOTE — Assessment & Plan Note (Signed)
BP at goal today and previous. BP was moderately to severely elevated at the dentist and she felt well during that time. Advised this could be situational and she can take an extra 1/2 pill losartan/hctz 100/25 prior to dentist to help aid BP readings there.

## 2021-05-29 NOTE — Assessment & Plan Note (Signed)
POC HgA1c done at 7.1. She is controlled with diet. Taking ARB but not statin.

## 2021-07-30 ENCOUNTER — Other Ambulatory Visit: Payer: Self-pay | Admitting: Internal Medicine

## 2021-10-08 NOTE — Progress Notes (Signed)
? ?   Deanna Richards Deanna Richards ?[facility] ?[address] [place] [address] ?Phone: [phone] ?  ?Assessment and Plan:   ?  ?1. Chronic pain of left ankle ?2. Peroneal tendinitis of left lower extremity ?-Chronic with exacerbation, initial sports medicine visit ?- Recurrence of left lateral ankle pain consistent with flare of peroneal tendinitis based on HPI, wearing open back shoes, physical exam ?- Advised to supportive wear closed back shoes.  Avoid crocs and sandals for the next 2 weeks ?- Start meloxicam 15 mg daily x2 weeks.  If still having pain after 2 weeks, complete 3rd-week of meloxicam. May use remaining meloxicam as needed once daily for pain control.  Do not to use additional NSAIDs while taking meloxicam.  May use Tylenol [phone] mg 2 to 3 times a day for breakthrough pain. ?- Start HEP for ankle ?  ?Pertinent previous records reviewed include none ?  ?Follow Up: 2 to 3 weeks for reevaluation.  If no improvement or worsening of symptoms would obtain x-ray ankle and could consider CSI ?  ?Subjective:   ?I, Deanna Richards, am serving as a Neurosurgeon for Deanna Richards ? ?Chief Complaint: left ankle pain  ? ?HPI:  ? ?10/09/2021 ?Patient is a 36 year old female complaining of left ankle pain. Patient states that her ankle has been acting up for about 7 days , no MOI , just woke up and couldn't walk has a hx of Achilles tear, no numbness or tingling, popping sometimes but only when she is stretching it , has not taken any meds, has been doing her RICEs, and that doesn't help , has been wearing crocs but has  been in the house for the most part ? ?Relevant Historical Information: Hypertension, DM type II ? ?Additional pertinent review of systems negative. ? ? ?Current Outpatient Medications:  ?  losartan-hydrochlorothiazide (HYZAAR) 100-25 MG tablet, Take 1 tablet by mouth once daily, Disp: 90 tablet, Rfl: 0 ?  meloxicam (MOBIC) 15 MG tablet, Take 1 tablet (15 mg total)  by mouth daily., Disp: 30 tablet, Rfl: 0 ?  norethindrone (AYGESTIN) 5 MG tablet, Take 1 tablet by mouth every 30 (thirty) days., Disp: , Rfl:  ?  simvastatin (ZOCOR) 20 MG tablet, Take 1 tablet (20 mg total) by mouth at bedtime., Disp: 90 tablet, Rfl: 3  ? ?Objective:   ?  ?Vitals:  ? 10/09/21 1258  ?BP: 126/80  ?Pulse: 95  ?SpO2: 97%  ?Weight: 263 lb (119.3 kg)  ?Height: 5\' 2"  (1.575 m)  ?  ?  ?Body mass index is 48.1 kg/m?.  ?  ?Physical Exam:   ? ?Gen: Appears well, nad, nontoxic and pleasant ?Psych: Alert and oriented, appropriate mood and affect ?Neuro: sensation intact, strength is 5/5 with df/pf/inv/ev, muscle tone wnl ?Skin: no susupicious lesions or rashes ? ?Left ankle: no deformity, no swelling or effusion ?TTP posterior to lateral malleolus, CFL ?NTTP over fibular head, lat mal, medial mal, achilles, navicular, base of 5th, ATFL,  , deltoid, calcaneous or midfoot ?ROM DF 30, PF 45, inv/ev intact ?Negative ant drawer, talar tilt, rotation test, squeeze test. ?Neg thompson ?No pain with resisted inversion ?Mild pain with resisted eversion ? ? ?Electronically signed by:  ? D.Deanna Richards ?[facility] ?1:18 PM 10/09/21 ?

## 2021-10-09 ENCOUNTER — Ambulatory Visit: Payer: 59 | Admitting: Sports Medicine

## 2021-10-09 VITALS — BP 126/80 | HR 95 | Ht 62.0 in | Wt 263.0 lb

## 2021-10-09 DIAGNOSIS — M7672 Peroneal tendinitis, left leg: Secondary | ICD-10-CM

## 2021-10-09 DIAGNOSIS — G8929 Other chronic pain: Secondary | ICD-10-CM | POA: Diagnosis not present

## 2021-10-09 DIAGNOSIS — M25572 Pain in left ankle and joints of left foot: Secondary | ICD-10-CM | POA: Diagnosis not present

## 2021-10-09 MED ORDER — MELOXICAM 15 MG PO TABS: 15.0000 mg | ORAL_TABLET | Freq: Every day | ORAL | 0 refills | Status: DC

## 2021-10-09 NOTE — Patient Instructions (Addendum)
Good to see you  ?- Start meloxicam 15 mg daily x2 weeks.  If still having pain after 2 weeks, complete 3rd-week of meloxicam. May use remaining meloxicam as needed once daily for pain control.  Do not to use additional NSAIDs while taking meloxicam.  May use Tylenol [phone] mg 2 to 3 times a day for breakthrough pain. ?Ankle HEP peroneal tendonitis  ?Recommend wearing supportive tennis shoes with closed back  ?2-3 week follow up  ?

## 2021-11-03 ENCOUNTER — Other Ambulatory Visit: Payer: Self-pay | Admitting: Internal Medicine

## 2021-11-08 ENCOUNTER — Ambulatory Visit: Payer: 59 | Admitting: Family Medicine

## 2021-11-14 ENCOUNTER — Other Ambulatory Visit: Payer: Self-pay | Admitting: Sports Medicine

## 2021-12-18 ENCOUNTER — Encounter: Payer: 59 | Admitting: Internal Medicine

## 2021-12-26 ENCOUNTER — Encounter: Payer: Self-pay | Admitting: Internal Medicine

## 2021-12-26 ENCOUNTER — Ambulatory Visit (INDEPENDENT_AMBULATORY_CARE_PROVIDER_SITE_OTHER): Payer: 59 | Admitting: Internal Medicine

## 2021-12-26 VITALS — BP 124/80 | HR 93 | Resp 18 | Ht 62.0 in | Wt 259.2 lb

## 2021-12-26 DIAGNOSIS — G4733 Obstructive sleep apnea (adult) (pediatric): Secondary | ICD-10-CM

## 2021-12-26 DIAGNOSIS — J410 Simple chronic bronchitis: Secondary | ICD-10-CM

## 2021-12-26 DIAGNOSIS — I10 Essential (primary) hypertension: Secondary | ICD-10-CM

## 2021-12-26 DIAGNOSIS — E1169 Type 2 diabetes mellitus with other specified complication: Secondary | ICD-10-CM

## 2021-12-26 DIAGNOSIS — Z789 Other specified health status: Secondary | ICD-10-CM

## 2021-12-26 DIAGNOSIS — E118 Type 2 diabetes mellitus with unspecified complications: Secondary | ICD-10-CM | POA: Diagnosis not present

## 2021-12-26 DIAGNOSIS — E785 Hyperlipidemia, unspecified: Secondary | ICD-10-CM

## 2021-12-26 DIAGNOSIS — Z0001 Encounter for general adult medical examination with abnormal findings: Secondary | ICD-10-CM

## 2021-12-26 LAB — MICROALBUMIN / CREATININE URINE RATIO
Creatinine,U: 48.3 mg/dL
Microalb Creat Ratio: 29.2 mg/g (ref 0.0–30.0)
Microalb, Ur: 14.1 mg/dL — ABNORMAL HIGH (ref 0.0–1.9)

## 2021-12-26 LAB — COMPREHENSIVE METABOLIC PANEL
ALT: 32 U/L (ref 0–35)
AST: 30 U/L (ref 0–37)
Albumin: 4.4 g/dL (ref 3.5–5.2)
Alkaline Phosphatase: 51 U/L (ref 39–117)
BUN: 9 mg/dL (ref 6–23)
CO2: 29 mEq/L (ref 19–32)
Calcium: 9.7 mg/dL (ref 8.4–10.5)
Chloride: 99 mEq/L (ref 96–112)
Creatinine, Ser: 0.83 mg/dL (ref 0.40–1.20)
GFR: 91.4 mL/min (ref >60.00)
Glucose, Bld: 141 mg/dL — ABNORMAL HIGH (ref 70–99)
Potassium: 3.5 mEq/L (ref 3.5–5.1)
Sodium: 137 mEq/L (ref 135–145)
Total Bilirubin: 0.5 mg/dL (ref 0.2–1.2)
Total Protein: 7.5 g/dL (ref 6.0–8.3)

## 2021-12-26 LAB — CBC
HCT: 39.4 % (ref 36.0–46.0)
Hemoglobin: 13.7 g/dL (ref 12.0–15.0)
MCHC: 34.8 g/dL (ref 30.0–36.0)
MCV: 89.6 fl (ref 78.0–100.0)
Platelets: 233 10*3/uL (ref 150.0–400.0)
RBC: 4.4 Mil/uL (ref 3.87–5.11)
RDW: 13.4 % (ref 11.5–15.5)
WBC: 6.3 10*3/uL (ref 4.0–10.5)

## 2021-12-26 LAB — HEMOGLOBIN A1C: Hgb A1c MFr Bld: 8.9 % — ABNORMAL HIGH (ref 4.6–6.5)

## 2021-12-26 LAB — LIPID PANEL
Cholesterol: 153 mg/dL (ref 0–200)
HDL: 42.3 mg/dL (ref >39.00)
LDL Cholesterol: 83 mg/dL (ref 0–99)
NonHDL: 110.79
Total CHOL/HDL Ratio: 4
Triglycerides: 139 mg/dL (ref 0.0–149.0)
VLDL: 27.8 mg/dL (ref 0.0–40.0)

## 2021-12-26 MED ORDER — SIMVASTATIN 20 MG PO TABS
20.0000 mg | ORAL_TABLET | Freq: Every day | ORAL | 3 refills | Status: DC
Start: 2021-12-26 — End: 2023-09-09

## 2021-12-26 MED ORDER — LOSARTAN POTASSIUM-HCTZ 100-25 MG PO TABS: 1.0000 | ORAL_TABLET | Freq: Every day | ORAL | 3 refills | Status: DC

## 2021-12-26 MED ORDER — OZEMPIC (0.25 OR 0.5 MG/DOSE) 2 MG/3ML ~~LOC~~ SOPN: PEN_INJECTOR | SUBCUTANEOUS | 0 refills | Status: DC

## 2021-12-26 NOTE — Progress Notes (Unsigned)
   Subjective:   Patient ID: Deanna Richards, female    DOB: 09-Mar-1986, 36 y.o.   MRN: [id number]  HPI The patient is here for physical.  PMH, [facility], social history reviewed and updated  Review of Systems  Objective:  Physical Exam  Vitals:   12/26/21 1034  BP: 124/80  Pulse: 93  Resp: 18  SpO2: 98%  Weight: 259 lb 3.2 oz (117.6 kg)  Height: 5\' 2"  (1.575 m)    Assessment & Plan:

## 2021-12-26 NOTE — Patient Instructions (Addendum)
Work on cutting back or stopping alcohol to help the weight.  We did prescribe ozempic to help the weight and sugars.  We will get the CPAP for you.

## 2021-12-27 DIAGNOSIS — E1169 Type 2 diabetes mellitus with other specified complication: Secondary | ICD-10-CM | POA: Insufficient documentation

## 2021-12-27 NOTE — Assessment & Plan Note (Signed)
Advised to quit smoking and she does not feel able at this time. Reminded about risk/harm from smoking.

## 2021-12-27 NOTE — Assessment & Plan Note (Signed)
Not using CPAP and willing to try. Ordered CPAP machine.

## 2021-12-27 NOTE — Assessment & Plan Note (Signed)
Checking HgA1c, she is taking no medication for this currently. Alcohol use has increased which could make her diabetes worse. Checking HgA1c, microalbumin to creatinine ratio, foot exam. Counseled about eye exam. Taking statin and ARB. Rx ozempic as she is motivated to work on weight loss.

## 2021-12-27 NOTE — Assessment & Plan Note (Signed)
Alcohol use is likely higher than recommended most weekday 3-5 drinks and weekends higher. This is likely contributing to her hypertension and diabetes. Asked her to reduce and she is pre-contemplative about that today.

## 2021-12-27 NOTE — Assessment & Plan Note (Signed)
Flu shot yearly. Covid-19 counseled. Tetanus declines. Pap smear with gyn. Counseled about sun safety and mole surveillance. Counseled about the dangers of distracted driving. Given 10 year screening recommendations.

## 2021-12-27 NOTE — Assessment & Plan Note (Signed)
BP at goal on losartan/hctz 100/25 mg daily. We discussed if she cut back or quit alcohol likely BP would decrease and that starting CPAP would also help lower BP naturally and we may need to cut back on medications. Checking CMP and adjust as needed.

## 2021-12-27 NOTE — Assessment & Plan Note (Signed)
Checking lipid panel and adjust simvastatin 20 mg daily for LDL <100.

## 2021-12-27 NOTE — Assessment & Plan Note (Signed)
Wants to lose weight and we have prescribed ozempic given concurrent diabetes.

## 2021-12-31 ENCOUNTER — Telehealth: Payer: Self-pay | Admitting: Internal Medicine

## 2021-12-31 NOTE — Telephone Encounter (Signed)
Mina from [employer] called regaurding orders for a CPAP machine (Order [id number]) for the pt. They are requesting we send over any clinical notes about pt's DX and state the pt will need a more recent sleep study before they can issue the machine.   For any questions please call:  Phone: [phone] Fax: [phone]

## 2022-01-01 NOTE — Telephone Encounter (Signed)
Office notes have been faxed over to [facility] with [employer] [facility]. Confirmation fax has been received.

## 2022-01-24 ENCOUNTER — Other Ambulatory Visit: Payer: Self-pay | Admitting: Internal Medicine

## 2022-02-01 ENCOUNTER — Telehealth: Payer: Self-pay

## 2022-02-01 NOTE — Telephone Encounter (Signed)
Pt Ozempicis approved  (Key: [id number])

## 2022-05-15 ENCOUNTER — Other Ambulatory Visit: Payer: Self-pay | Admitting: Internal Medicine

## 2022-05-16 ENCOUNTER — Other Ambulatory Visit: Payer: Self-pay | Admitting: Internal Medicine

## 2022-05-16 MED ORDER — SEMAGLUTIDE (1 MG/DOSE) 4 MG/3ML ~~LOC~~ SOPN: 1.0000 mg | PEN_INJECTOR | SUBCUTANEOUS | 0 refills | Status: DC

## 2022-05-16 MED ORDER — SEMAGLUTIDE (2 MG/DOSE) 8 MG/3ML ~~LOC~~ SOPN: 2.0000 mg | PEN_INJECTOR | SUBCUTANEOUS | 5 refills | Status: DC

## 2022-05-23 ENCOUNTER — Telehealth: Payer: Self-pay | Admitting: Internal Medicine

## 2022-05-23 NOTE — Telephone Encounter (Signed)
Spoke with patient about her concerns and was able to advise her better.

## 2022-05-23 NOTE — Telephone Encounter (Signed)
Patient called and she is experiencing a cough - she has been taking Mucinex cough syrup and expectorant - is this okay to take with her diabetes medication?  Patients number:  [phone]

## 2022-06-08 ENCOUNTER — Other Ambulatory Visit: Payer: Self-pay | Admitting: Internal Medicine

## 2022-07-09 ENCOUNTER — Other Ambulatory Visit: Payer: Self-pay | Admitting: Internal Medicine

## 2022-08-07 ENCOUNTER — Other Ambulatory Visit: Payer: Self-pay | Admitting: Internal Medicine

## 2022-09-13 ENCOUNTER — Other Ambulatory Visit: Payer: Self-pay | Admitting: Internal Medicine

## 2022-10-18 ENCOUNTER — Other Ambulatory Visit: Payer: Self-pay | Admitting: Internal Medicine

## 2022-11-16 ENCOUNTER — Other Ambulatory Visit: Payer: Self-pay | Admitting: Internal Medicine

## 2022-12-15 ENCOUNTER — Other Ambulatory Visit: Payer: Self-pay | Admitting: Internal Medicine

## 2022-12-23 ENCOUNTER — Other Ambulatory Visit: Payer: Self-pay

## 2022-12-23 ENCOUNTER — Emergency Department (HOSPITAL_BASED_OUTPATIENT_CLINIC_OR_DEPARTMENT_OTHER)
Admission: EM | Admit: 2022-12-23 | Discharge: 2022-12-23 | Disposition: A | Discharge status: Home / Self Care | Payer: 59 | Attending: Emergency Medicine | Admitting: Emergency Medicine

## 2022-12-23 ENCOUNTER — Emergency Department (HOSPITAL_BASED_OUTPATIENT_CLINIC_OR_DEPARTMENT_OTHER): Payer: 59

## 2022-12-23 ENCOUNTER — Encounter (HOSPITAL_BASED_OUTPATIENT_CLINIC_OR_DEPARTMENT_OTHER): Payer: Self-pay

## 2022-12-23 DIAGNOSIS — I10 Essential (primary) hypertension: Secondary | ICD-10-CM | POA: Insufficient documentation

## 2022-12-23 DIAGNOSIS — R519 Headache, unspecified: Secondary | ICD-10-CM | POA: Diagnosis not present

## 2022-12-23 DIAGNOSIS — Z79899 Other long term (current) drug therapy: Secondary | ICD-10-CM | POA: Diagnosis not present

## 2022-12-23 DIAGNOSIS — E119 Type 2 diabetes mellitus without complications: Secondary | ICD-10-CM | POA: Insufficient documentation

## 2022-12-23 DIAGNOSIS — M25511 Pain in right shoulder: Secondary | ICD-10-CM | POA: Diagnosis present

## 2022-12-23 DIAGNOSIS — R42 Dizziness and giddiness: Secondary | ICD-10-CM | POA: Insufficient documentation

## 2022-12-23 DIAGNOSIS — Y92410 Unspecified street and highway as the place of occurrence of the external cause: Secondary | ICD-10-CM | POA: Diagnosis not present

## 2022-12-23 DIAGNOSIS — S46911A Strain of unspecified muscle, fascia and tendon at shoulder and upper arm level, right arm, initial encounter: Secondary | ICD-10-CM | POA: Diagnosis not present

## 2022-12-23 DIAGNOSIS — S39012A Strain of muscle, fascia and tendon of lower back, initial encounter: Secondary | ICD-10-CM | POA: Diagnosis not present

## 2022-12-23 HISTORY — DX: Essential (primary) hypertension: I10

## 2022-12-23 HISTORY — DX: Type 2 diabetes mellitus without complications: E11.9

## 2022-12-23 LAB — PREGNANCY, URINE: Preg Test, Ur: NEGATIVE

## 2022-12-23 MED ORDER — IBUPROFEN 600 MG PO TABS: 600.0000 mg | ORAL_TABLET | Freq: Three times a day (TID) | ORAL | 0 refills | Status: AC | PRN

## 2022-12-23 MED ORDER — CYCLOBENZAPRINE HCL 10 MG PO TABS
10.0000 mg | ORAL_TABLET | Freq: Three times a day (TID) | ORAL | 0 refills | Status: AC | PRN
Start: 2022-12-23 — End: 2022-12-26

## 2022-12-23 NOTE — ED Notes (Signed)
ED Provider at bedside. 
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
## 2023-01-15 ENCOUNTER — Other Ambulatory Visit: Payer: Self-pay | Admitting: Internal Medicine

## 2023-01-16 ENCOUNTER — Telehealth: Payer: Self-pay | Admitting: Internal Medicine

## 2023-01-16 ENCOUNTER — Encounter: Payer: Self-pay | Admitting: Internal Medicine

## 2023-01-16 NOTE — Telephone Encounter (Signed)
Patient called and said she has tingling in her feet. She said she wasn't sure if it was related to diabetes. Patient would like to know if Dr. Okey Dupre would like to see her or would recommend she go to the ER.  Patient scheduled an appointment with Dr. Okey Dupre on her soonest availability of 01/29/23 and declined a sooner appointment with another provider. Patient would like a call back at [phone].

## 2023-01-16 NOTE — Telephone Encounter (Signed)
Patient has been transferred to triage

## 2023-01-16 NOTE — Telephone Encounter (Signed)
Needs nurse triage as we need more clinical information. Please if desires apt needs ASAP apt with any provider not next with PCP

## 2023-01-17 NOTE — Telephone Encounter (Signed)
Pt was contacted and transferred to triage

## 2023-01-29 ENCOUNTER — Ambulatory Visit: Payer: 59 | Admitting: Internal Medicine

## 2023-01-29 ENCOUNTER — Other Ambulatory Visit: Payer: Self-pay | Admitting: Internal Medicine

## 2023-01-29 ENCOUNTER — Encounter: Payer: Self-pay | Admitting: Internal Medicine

## 2023-01-29 VITALS — BP 140/100 | HR 90 | Temp 98.8°F | Ht 62.0 in | Wt 252.0 lb

## 2023-01-29 DIAGNOSIS — E785 Hyperlipidemia, unspecified: Secondary | ICD-10-CM

## 2023-01-29 DIAGNOSIS — E118 Type 2 diabetes mellitus with unspecified complications: Secondary | ICD-10-CM | POA: Diagnosis not present

## 2023-01-29 DIAGNOSIS — F1020 Alcohol dependence, uncomplicated: Secondary | ICD-10-CM

## 2023-01-29 DIAGNOSIS — E1169 Type 2 diabetes mellitus with other specified complication: Secondary | ICD-10-CM

## 2023-01-29 DIAGNOSIS — Z7985 Long-term (current) use of injectable non-insulin antidiabetic drugs: Secondary | ICD-10-CM

## 2023-01-29 DIAGNOSIS — Z0001 Encounter for general adult medical examination with abnormal findings: Secondary | ICD-10-CM | POA: Diagnosis not present

## 2023-01-29 DIAGNOSIS — R202 Paresthesia of skin: Secondary | ICD-10-CM | POA: Diagnosis not present

## 2023-01-29 DIAGNOSIS — I10 Essential (primary) hypertension: Secondary | ICD-10-CM | POA: Diagnosis not present

## 2023-01-29 DIAGNOSIS — J410 Simple chronic bronchitis: Secondary | ICD-10-CM

## 2023-01-29 LAB — COMPREHENSIVE METABOLIC PANEL
ALT: 24 U/L (ref 0–35)
AST: 20 U/L (ref 0–37)
Albumin: 4 g/dL (ref 3.5–5.2)
Alkaline Phosphatase: 48 U/L (ref 39–117)
BUN: 11 mg/dL (ref 6–23)
CO2: 28 meq/L (ref 19–32)
Calcium: 9.2 mg/dL (ref 8.4–10.5)
Chloride: 103 meq/L (ref 96–112)
Creatinine, Ser: 0.74 mg/dL (ref 0.40–1.20)
GFR: 104.09 mL/min (ref >60.00)
Glucose, Bld: 126 mg/dL — ABNORMAL HIGH (ref 70–99)
Potassium: 3.5 meq/L (ref 3.5–5.1)
Sodium: 140 meq/L (ref 135–145)
Total Bilirubin: 0.3 mg/dL (ref 0.2–1.2)
Total Protein: 7.2 g/dL (ref 6.0–8.3)

## 2023-01-29 LAB — CBC
HCT: 41.2 % (ref 36.0–46.0)
Hemoglobin: 13.8 g/dL (ref 12.0–15.0)
MCHC: 33.6 g/dL (ref 30.0–36.0)
MCV: 92.1 fl (ref 78.0–100.0)
Platelets: 238 10*3/uL (ref 150.0–400.0)
RBC: 4.47 Mil/uL (ref 3.87–5.11)
RDW: 13.7 % (ref 11.5–15.5)
WBC: 6 10*3/uL (ref 4.0–10.5)

## 2023-01-29 LAB — MICROALBUMIN / CREATININE URINE RATIO
Creatinine,U: 90.9 mg/dL
Microalb Creat Ratio: 7 mg/g (ref 0.0–30.0)
Microalb, Ur: 6.3 mg/dL — ABNORMAL HIGH (ref 0.0–1.9)

## 2023-01-29 LAB — TSH: TSH: 0.67 u[IU]/mL (ref 0.35–5.50)

## 2023-01-29 LAB — POCT GLYCOSYLATED HEMOGLOBIN (HGB A1C): HbA1c POC (<> result, manual entry): 6.9 % (ref 4.0–5.6)

## 2023-01-29 LAB — LIPID PANEL
Cholesterol: 129 mg/dL (ref 0–200)
HDL: 32.9 mg/dL — ABNORMAL LOW (ref >39.00)
LDL Cholesterol: 79 mg/dL (ref 0–99)
NonHDL: 96.09
Total CHOL/HDL Ratio: 4
Triglycerides: 85 mg/dL (ref 0.0–149.0)
VLDL: 17 mg/dL (ref 0.0–40.0)

## 2023-01-29 LAB — VITAMIN B12: Vitamin B-12: 228 pg/mL (ref 211–911)

## 2023-01-29 LAB — VITAMIN D 25 HYDROXY (VIT D DEFICIENCY, FRACTURES): VITD: 21.65 ng/mL — ABNORMAL LOW (ref 30.00–100.00)

## 2023-01-29 MED ORDER — LOSARTAN POTASSIUM-HCTZ 100-25 MG PO TABS: 1.0000 | ORAL_TABLET | Freq: Every day | ORAL | 3 refills | Status: DC

## 2023-01-29 MED ORDER — SEMAGLUTIDE (2 MG/DOSE) 8 MG/3ML ~~LOC~~ SOPN: 2.0000 mg | PEN_INJECTOR | SUBCUTANEOUS | 5 refills | Status: DC

## 2023-01-29 NOTE — Assessment & Plan Note (Addendum)
Previously uncontrolled and was to start ozempic. She did not follow up so control unknown. Did POC HgA1c and this is 6.9

## 2023-01-29 NOTE — Progress Notes (Unsigned)
   Subjective:   Patient ID: Deanna Richards, female    DOB: [birth year], 37 y.o.   MRN: [id number]  Sinus Problem Pertinent negatives include no coughing or shortness of breath.   The patient is a 37 YO female coming in for concerns with new tingling in feet and intermittent lightheadedness. Tingling in feet 1-2 weeks intermittent.  Also desires physical.  PMH, FMH, social history reviewed and updated  Review of Systems  Constitutional: Negative.   HENT: Negative.    Eyes: Negative.   Respiratory:  Positive for wheezing. Negative for cough, chest tightness and shortness of breath.   Cardiovascular:  Negative for chest pain, palpitations and leg swelling.  Gastrointestinal:  Negative for abdominal distention, abdominal pain, constipation, diarrhea, nausea and vomiting.  Musculoskeletal: Negative.   Skin: Negative.   Neurological:  Positive for light-headedness and numbness.  Psychiatric/Behavioral: Negative.      Objective:  Physical Exam Constitutional:      Appearance: She is well-developed. She is obese.  HENT:     Head: Normocephalic and atraumatic.  Cardiovascular:     Rate and Rhythm: Normal rate and regular rhythm.  Pulmonary:     Effort: Pulmonary effort is normal. No respiratory distress.     Breath sounds: Normal breath sounds. No wheezing or rales.  Abdominal:     General: Bowel sounds are normal. There is no distension.     Palpations: Abdomen is soft.     Tenderness: There is no abdominal tenderness. There is no rebound.  Musculoskeletal:     Cervical back: Normal range of motion.  Skin:    General: Skin is warm and dry.     Comments: Foot exam done  Neurological:     Mental Status: She is alert and oriented to person, place, and time.     Coordination: Coordination normal.     Vitals:   01/29/23 0906 01/29/23 0909 01/29/23 0944  BP: (!) 160/100 (!) 160/100 (!) 140/100  Pulse: 90    Temp: 98.8 F (37.1 C)    TempSrc: Oral    SpO2: 95%    Weight:  252 lb (114.3 kg)    Height: 5\' 2"  (1.575 m)      Assessment & Plan:

## 2023-01-29 NOTE — Patient Instructions (Signed)
We will check the labs today. 

## 2023-01-30 DIAGNOSIS — R202 Paresthesia of skin: Secondary | ICD-10-CM | POA: Insufficient documentation

## 2023-01-30 NOTE — Assessment & Plan Note (Signed)
Checking lipid panel and adjust simvastatin 20 mg daily as needed.

## 2023-01-30 NOTE — Assessment & Plan Note (Signed)
Flu shot yearly counseled. Tetanus declines. Pap smear up to date with gyn. Counseled about sun safety and mole surveillance. Counseled about the dangers of distracted driving. Given 10 year screening recommendations.

## 2023-01-30 NOTE — Assessment & Plan Note (Signed)
Still coughing intermittently and some heavy breathing at times sounds like wheezing. Not captured in office so unclear if this is upper airway or new asthma. She denies SOB during these episodes. She is still smoking and advised to reduce or quit but is not able at this time to make attempt.

## 2023-01-30 NOTE — Assessment & Plan Note (Addendum)
Drinking 5-7+ drinks per day typically. Talked about need to cut back to help health and she does not feel able at this time. We discussed how her new tingling could be related to high alcohol intake.

## 2023-01-30 NOTE — Assessment & Plan Note (Signed)
BP is moderately uncontrolled and she feels this is because she just took BP meds prior to visit. Needs close follow up and if still high add another agent. Continue losartan/hydrochlorothiazide 100/25 mg daily for now. Checking CMP and adjust as needed.

## 2023-01-30 NOTE — Assessment & Plan Note (Signed)
New tingling in feet last 2-3 weeks. Could be related to nutritional deficiency or alcohol toxicity or diabetic neuropathy. Checking B12, TSH, vitamin D, CBC, CMP. POC HgA1c done at visit and is at goal but previously has been not at goal. Depending on results we may or may not be able to determine etiology. Discussed with Deanna Richards if we need to find the etiology we could refer for nerve conduction study.

## 2023-01-30 NOTE — Assessment & Plan Note (Signed)
Taking ozempic and will increase dose to help. Her alcohol intake is likely contributing to lack of ability to lose weight easily and we talked about reduction.

## 2023-01-31 LAB — RPR: RPR Ser Ql: NONREACTIVE

## 2023-01-31 LAB — HIV ANTIBODY (ROUTINE TESTING W REFLEX): HIV 1&2 Ab, 4th Generation: NONREACTIVE

## 2023-02-01 LAB — GC/CHLAMYDIA PROBE AMP
Chlamydia trachomatis, NAA: NEGATIVE
Neisseria Gonorrhoeae by PCR: NEGATIVE

## 2023-02-10 ENCOUNTER — Telehealth: Payer: Self-pay

## 2023-02-10 NOTE — Telephone Encounter (Signed)
Pharmacy Patient Advocate Encounter   Received notification from CoverMyMeds that prior authorization for Ozempic (2 MG/DOSE) 8MG /3ML pen-injectors is required/requested.   Insurance verification completed.   The patient is insured through [facility] .   Per test claim: PA required; PA submitted to [facility] via CoverMyMeds Key/confirmation #/EOC [id number] Status is pending

## 2023-02-11 NOTE — Telephone Encounter (Signed)
Pharmacy Patient Advocate Encounter  Received notification from [facility] that Prior Authorization for Ozempic (2 MG/DOSE) 8MG /3ML pen-injectors has been APPROVED from 02/10/23 to 02/10/24   PA #/Case ID/Reference #: [id number]

## 2023-02-11 NOTE — Telephone Encounter (Signed)
Informed patient

## 2023-04-28 ENCOUNTER — Ambulatory Visit: Payer: 59 | Admitting: Internal Medicine

## 2023-06-09 ENCOUNTER — Other Ambulatory Visit: Payer: Self-pay | Admitting: Orthopedic Surgery

## 2023-06-09 DIAGNOSIS — M25531 Pain in right wrist: Secondary | ICD-10-CM | POA: Insufficient documentation

## 2023-06-13 ENCOUNTER — Ambulatory Visit
Admission: RE | Admit: 2023-06-13 | Discharge: 2023-06-13 | Disposition: A | Discharge status: Home / Self Care | Payer: 59 | Source: Ambulatory Visit | Attending: Orthopedic Surgery | Admitting: Orthopedic Surgery

## 2023-06-13 DIAGNOSIS — M25531 Pain in right wrist: Secondary | ICD-10-CM

## 2023-06-13 DIAGNOSIS — M87837 Other osteonecrosis of right carpus: Secondary | ICD-10-CM | POA: Insufficient documentation

## 2023-07-22 ENCOUNTER — Other Ambulatory Visit: Payer: Self-pay | Admitting: Internal Medicine

## 2023-07-23 NOTE — Telephone Encounter (Signed)
 Due for visit can have 1 month supply but needs visit in that time.

## 2023-08-13 ENCOUNTER — Encounter: Payer: Self-pay | Admitting: Internal Medicine

## 2023-08-29 ENCOUNTER — Other Ambulatory Visit: Payer: Self-pay | Admitting: Internal Medicine

## 2023-09-09 ENCOUNTER — Ambulatory Visit: Admitting: Internal Medicine

## 2023-09-09 ENCOUNTER — Encounter: Payer: Self-pay | Admitting: Internal Medicine

## 2023-09-09 VITALS — BP 130/78 | Temp 99.0°F | Ht 62.0 in | Wt 254.0 lb

## 2023-09-09 DIAGNOSIS — E785 Hyperlipidemia, unspecified: Secondary | ICD-10-CM | POA: Diagnosis not present

## 2023-09-09 DIAGNOSIS — I10 Essential (primary) hypertension: Secondary | ICD-10-CM | POA: Diagnosis not present

## 2023-09-09 DIAGNOSIS — E118 Type 2 diabetes mellitus with unspecified complications: Secondary | ICD-10-CM

## 2023-09-09 DIAGNOSIS — E1169 Type 2 diabetes mellitus with other specified complication: Secondary | ICD-10-CM | POA: Diagnosis not present

## 2023-09-09 DIAGNOSIS — Z7985 Long-term (current) use of injectable non-insulin antidiabetic drugs: Secondary | ICD-10-CM

## 2023-09-09 MED ORDER — SIMVASTATIN 20 MG PO TABS: 20.0000 mg | ORAL_TABLET | Freq: Every day | ORAL | 3 refills | Status: AC

## 2023-09-09 MED ORDER — OZEMPIC (2 MG/DOSE) 8 MG/3ML ~~LOC~~ SOPN: PEN_INJECTOR | SUBCUTANEOUS | 3 refills | Status: DC

## 2023-09-09 NOTE — Assessment & Plan Note (Signed)
 Lab Results  Component Value Date   LDLCALC 79 01/29/2023   Uncontrolled goal ldl < 70, pt to restart zocor 20 mg every day and lower chol diet

## 2023-09-09 NOTE — Progress Notes (Signed)
 Patient ID: Deanna Richards, female   DOB: 05/18/1986, 38 y.o.   MRN: [id number]

## 2023-09-09 NOTE — Patient Instructions (Addendum)
 Please take all new medication as prescribed  - the ozempic and simvastatin  Please continue all other medications as before, and refills have been done if requested.  Please have the pharmacy call with any other refills you may need.  Please continue your efforts at being more active, low cholesterol diet, and weight control.  Please keep your appointments with your specialists as you may have planned  You will be contacted regarding the referral for: Diabetes Education  Please consider change ozempic to mounjaro if you are still unable to lose significant weight with better diet, exercise and the ozempic 2 mg

## 2023-09-09 NOTE — Progress Notes (Signed)
 Patient ID: Deanna Richards, female   DOB: 1985/09/06, 38 y.o.   MRN: [id number]        Chief Complaint: follow up DM, HLD , HTN as pcp out of office       HPI:  Deanna Richards is a 38 y.o. female here overall doing ok with much improved recent hx  of improved A1c to 6.9, tolerating ozempic 2 mg weekly but has not had significant wt loss. Pt admits she could improve her diet, activity and ETOH intake with empty calories.  Pt denies chest pain, increased sob or doe, wheezing, orthopnea, PND, increased LE swelling, palpitations, dizziness or syncope.   Pt denies polydipsia, polyuria, or new focal neuro s/s.   Not taking statin recently but willing to restart       Wt Readings from Last 3 Encounters:  09/09/23 254 lb (115.2 kg)  01/29/23 252 lb (114.3 kg)  12/23/22 255 lb (115.7 kg)   BP Readings from Last 3 Encounters:  09/09/23 130/78  01/29/23 (!) 140/100  12/23/22 (!) 151/95         Past Medical History:  Diagnosis Date   Carpal tunnel syndrome of right wrist 10/25/2013   Diabetes mellitus without complication (HCC)    Hypertension    PCOS (polycystic ovarian syndrome)    no current med.   Past Surgical History:  Procedure Laterality Date   CARPAL TUNNEL RELEASE Right 11/05/2013   Procedure: CARPAL TUNNEL RELEASE ENDOSCOPIC RIGHT WRIST;  Surgeon: Sheral Apley, MD;  Location: [facility];  Service: Orthopedics;  Laterality: Right;   NO PAST SURGERIES      reports that she has been smoking cigars and cigarettes. She has a 1.5 pack-year smoking history. She has never used smokeless tobacco. She reports current alcohol use. She reports that she does not use drugs. family history includes Hypertension in her mother. She was adopted. No Known Allergies Current Outpatient Medications on File Prior to Visit  Medication Sig Dispense Refill   losartan-hydrochlorothiazide (HYZAAR) 100-25 MG tablet Take 1 tablet by mouth daily. 90 tablet 3   norethindrone (AYGESTIN) 5  MG tablet Take 1 tablet by mouth every 30 (thirty) days. (Patient not taking: Reported on 09/09/2023)     No current facility-administered medications on file prior to visit.        ROS:  All others reviewed and negative.  Objective        PE:  BP 130/78 (BP Location: Right Arm, Patient Position: Sitting, Cuff Size: Normal)   Temp 99 F (37.2 C) (Oral)   Ht 5\' 2"  (1.575 m)   Wt 254 lb (115.2 kg)   BMI 46.46 kg/m                 Constitutional: Pt appears in NAD               HENT: Head: NCAT.                Right Ear: External ear normal.                 Left Ear: External ear normal.                Eyes: . Pupils are equal, round, and reactive to light. Conjunctivae and EOM are normal               Nose: without d/c or deformity  Neck: Neck supple. Gross normal ROM               Cardiovascular: Normal rate and regular rhythm.                 Pulmonary/Chest: Effort normal and breath sounds without rales or wheezing.                Abd:  Soft, NT, ND, + BS, no organomegaly               Neurological: Pt is alert. At baseline orientation, motor grossly intact               Skin: Skin is warm. No rashes, no other new lesions, LE edema - none               Psychiatric: Pt behavior is normal without agitation   Micro: none  Cardiac tracings I have personally interpreted today:  none  Pertinent Radiological findings (summarize): none   Lab Results  Component Value Date   WBC 6.0 01/29/2023   HGB 13.8 01/29/2023   HCT 41.2 01/29/2023   PLT 238.0 01/29/2023   GLUCOSE 126 (H) 01/29/2023   CHOL 129 01/29/2023   TRIG 85.0 01/29/2023   HDL 32.90 (L) 01/29/2023   LDLDIRECT 93.0 06/15/2020   LDLCALC 79 01/29/2023   ALT 24 01/29/2023   AST 20 01/29/2023   NA 140 01/29/2023   K 3.5 01/29/2023   CL 103 01/29/2023   CREATININE 0.74 01/29/2023   BUN 11 01/29/2023   CO2 28 01/29/2023   TSH 0.67 01/29/2023   HGBA1C 6.9 01/29/2023   MICROALBUR 6.3 (H) 01/29/2023    Assessment/Plan:  Deanna Richards is a 38 y.o. Black or African American [2] female with  has a past medical history of Carpal tunnel syndrome of right wrist (10/25/2013), Diabetes mellitus without complication (HCC), Hypertension, and PCOS (polycystic ovarian syndrome).  Essential hypertension BP Readings from Last 3 Encounters:  09/09/23 130/78  01/29/23 (!) 140/100  12/23/22 (!) 151/95   Stable, pt to continue medical treatment hyzaar 100 25 qd   Diabetes mellitus type 2 with complications (HCC) Lab Results  Component Value Date   HGBA1C 6.9 01/29/2023   Mild uncontrolled, pt to continue current medical treatment ozempic 2 mg weekly but could consider change to mounjaro to effect more wt loss, also for DM education referral   Hyperlipidemia associated with type 2 diabetes mellitus (HCC) Lab Results  Component Value Date   LDLCALC 79 01/29/2023   Uncontrolled goal ldl < 70, pt to restart zocor 20 mg every day and lower chol diet  Followup: Return if symptoms worsen or fail to improve.  Rosalia Colonel, MD 09/09/2023 1:02 PM [facility]

## 2023-09-09 NOTE — Assessment & Plan Note (Signed)
 BP Readings from Last 3 Encounters:  09/09/23 130/78  01/29/23 (!) 140/100  12/23/22 (!) 151/95   Stable, pt to continue medical treatment hyzaar 100 25 qd

## 2023-09-09 NOTE — Assessment & Plan Note (Addendum)
 Lab Results  Component Value Date   HGBA1C 6.9 01/29/2023   Mild uncontrolled, pt to continue current medical treatment ozempic 2 mg weekly but could consider change to mounjaro to effect more wt loss, also for DM education referral

## 2023-10-09 NOTE — Progress Notes (Signed)
 Diabetes Self-Management Education  Visit Type: First/Initial  Appt. Start Time: 1447 Appt. End Time: 1550  10/15/2023  [name], identified by name and date of birth, is a 38 y.o. female with a diagnosis of Diabetes: Type 2.   ASSESSMENT  Patient is here today alone. Patient would like to learn more about eating healthier for diabetes and weight management. 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 Patient lives with her adult niece.  Pt reports she does shopping and cooking. Pt reports she has made the following changes including eating out less, using factor meals. Pt reports appetite is decreased with weekly GLP.  Pt reports she is connected to therapy and will reach out to them to schedule. All Pt's questions were answered during this encounter.   History includes:   Past Medical History:  Diagnosis Date   Carpal tunnel syndrome of right wrist 10/25/2013   Diabetes mellitus without complication (HCC)    Hypertension    PCOS (polycystic ovarian syndrome)    no current med.    Medications include:   Current Outpatient Medications:    cetirizine (ZYRTEC) 10 MG chewable tablet, Chew 10 mg by mouth daily., Disp: , Rfl:    losartan -hydrochlorothiazide  (HYZAAR) 100-25 MG tablet, Take 1 tablet by mouth daily., Disp: 90 tablet, Rfl: 3   Semaglutide , 2 MG/DOSE, (OZEMPIC , 2 MG/DOSE,) 8 MG/3ML SOPN, INJECT 2 MG SUBCUTANEOUSLY ONCE A WEEK AS DIRECTED. NEEDS OFFICE VISIT FOR FURTHER REFILLS., Disp: 3 mL, Rfl: 3   norethindrone (AYGESTIN) 5 MG tablet, Take 1 tablet by mouth every 30 (thirty) days. (Patient not taking: Reported on 09/09/2023), Disp: , Rfl:    simvastatin  (ZOCOR ) 20 MG tablet, Take 1 tablet (20 mg total) by mouth at bedtime. (Patient not taking: Reported on 10/15/2023), Disp: 90 tablet, Rfl: 3  Labs noted:   Lab Results  Component Value Date   HGBA1C 6.9 01/29/2023   Lab Results  Component Value Date   CHOL 129  01/29/2023   HDL 32.90 (L) 01/29/2023   LDLCALC 79 01/29/2023   LDLDIRECT 93.0 06/15/2020   TRIG 85.0 01/29/2023   CHOLHDL 4 01/29/2023   BP Readings from Last 3 Encounters:  09/09/23 130/78  01/29/23 (!) 140/100  12/23/22 (!) 151/95   Wt Readings from Last 3 Encounters:  09/09/23 254 lb (115.2 kg)  01/29/23 252 lb (114.3 kg)  12/23/22 255 lb (115.7 kg)    There were no vitals taken for this visit. There is no height or weight on file to calculate BMI.   Diabetes Self-Management Education - 10/15/23 1659       Visit Information   Visit Type First/Initial      Initial Visit   Diabetes Type Type 2    Date Diagnosed 2023    Are you currently following a meal plan? Yes    What type of meal plan do you follow? lactose free    Are you taking your medications as prescribed? Yes      Health Coping   How would you rate your overall health? Fair      Psychosocial Assessment   Patient Belief/Attitude about Diabetes Afraid    What is the hardest part about your diabetes right now, causing you the most concern, or is the most worrisome to you about your diabetes?   Other (comment)   "cant identify   Self-care barriers None    Self-management support Doctor's office    Other  persons present Patient    Patient Concerns Nutrition/Meal planning;Weight Control    Special Needs None    Preferred Learning Style No preference indicated    Learning Readiness Ready    How often do you need to have someone help you when you read instructions, pamphlets, or other written materials from your doctor or pharmacy? 1 - Never    What is the last grade level you completed in school? 12th      Pre-Education Assessment   Patient understands the diabetes disease and treatment process. Needs Instruction    Patient understands incorporating nutritional management into lifestyle. Needs Instruction    Patient undertands incorporating physical activity into lifestyle. Needs Instruction    Patient  understands using medications safely. Needs Instruction    Patient understands monitoring blood glucose, interpreting and using results Needs Instruction    Patient understands prevention, detection, and treatment of acute complications. Needs Instruction    Patient understands prevention, detection, and treatment of chronic complications. Needs Instruction    Patient understands how to develop strategies to address psychosocial issues. Needs Instruction    Patient understands how to develop strategies to promote health/change behavior. Needs Instruction      Complications   Last HgB A1C per patient/outside source 6.9 %    How often do you check your blood sugar? 0 times/day (not testing)    Have you had a dilated eye exam in the past 12 months? Yes    Have you had a dental exam in the past 12 months? No    Are you checking your feet? No   Pt instructed to check feet daily     Dietary Intake   Breakfast skips most often eggs, bacon or  sausage or omelet with kale mushroom, water or orange juice    Lunch skips often    Dinner [place] burger, 1/2 bun, lettuce, cheese, tomato, pepsi or sweet tea or lemonade    Beverage(s) diet cranberry, water, pepsi, juice, 1-2 shots of crown royal      Activity / Exercise   Activity / Exercise Type ADL's      Patient Education   Previous Diabetes Education No    Disease Pathophysiology Explored patient's options for treatment of their diabetes    Healthy Eating Plate Method;Role of diet in the treatment of diabetes and the relationship between the three main macronutrients and blood glucose level;Food label reading, portion sizes and measuring food.;Reviewed blood glucose goals for pre and post meals and how to evaluate the patients' food intake on their blood glucose level.    Being Active Role of exercise on diabetes management, blood pressure control and cardiac health.    Medications Taught/reviewed insulin/injectables, injection, site rotation,  insulin/injectables storage and needle disposal.    Monitoring Daily foot exams;Yearly dilated eye exam;Identified appropriate SMBG and/or A1C goals.    Acute complications Other (comment)   encouraged SMBG   Chronic complications Dental care    Diabetes Stress and Support Role of stress on diabetes;Worked with patient to identify barriers to care and solutions;Identified and addressed patients feelings and concerns about diabetes    Preconception care Role of family planning for patients with diabetes    Lifestyle and Health Coping Helped patient develop diabetes management plan for (enter comment)   weight management     Individualized Goals (developed by patient)   Nutrition Follow meal plan discussed    Medications take my medication as prescribed    Monitoring  Other (comment)   SMBG  encouraged   Problem Solving Eating Pattern;Sleep Pattern    Reducing Risk do foot checks daily    Health Coping Ask for help with psychological, social, or emotional issues      Post-Education Assessment   Patient understands the diabetes disease and treatment process. Needs Review    Patient understands incorporating nutritional management into lifestyle. Needs Review    Patient undertands incorporating physical activity into lifestyle. Needs Review    Patient understands using medications safely. Needs Review    Patient understands monitoring blood glucose, interpreting and using results Needs Review    Patient understands prevention, detection, and treatment of acute complications. Needs Review    Patient understands prevention, detection, and treatment of chronic complications. Needs Review    Patient understands how to develop strategies to address psychosocial issues. Needs Review    Patient understands how to develop strategies to promote health/change behavior. Needs Review      Outcomes   Expected Outcomes Demonstrated interest in learning. Expect positive outcomes    Future DMSE 2 months     Program Status Not Completed             Individualized Plan for Diabetes Self-Management Training:   Learning Objective:  Patient will have a greater understanding of diabetes self-management. Patient education plan is to attend individual and/or group sessions per assessed needs and concerns.   Plan:   Patient Instructions  Avoid skipping meals and snacks   Pack balanced meals and snacks in a cooler for work week  Expected Outcomes:  Demonstrated interest in learning. Expect positive outcomes  Education material provided: ADA - How to Thrive: A Guide for Your Journey with Diabetes, My Plate, Snack sheet, and Support group flyer  If problems or questions, patient to contact team via:  Phone  Future DSME appointment: 2 months

## 2023-10-15 ENCOUNTER — Encounter: Attending: Internal Medicine | Admitting: Dietician

## 2023-10-15 DIAGNOSIS — E118 Type 2 diabetes mellitus with unspecified complications: Secondary | ICD-10-CM | POA: Diagnosis present

## 2023-10-15 NOTE — Patient Instructions (Signed)
 Avoid skipping meals and snacks   Pack balanced meals and snacks in a cooler for work week

## 2023-11-26 NOTE — Progress Notes (Deleted)
    Diabetes Self-Management Education  Visit Type:    Appt. Start Time:  *** Appt. End Time: ***  11/26/2023  [name], identified by name and date of birth, is a 38 y.o. female with a diagnosis of Diabetes:  .   ASSESSMENT *** 5/21: Patient is here today alone. Patient would like to learn more about eating healthier for diabetes and weight management. 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 Patient lives with her adult niece.  Pt reports she does shopping and cooking. Pt reports she has made the following changes including eating out less and using factor meals. Pt reports appetite is decreased with weekly GLP.  Pt reports she is connected to therapy and will reach out to them to schedule. All Pt's questions were answered during this encounter.   History includes:   Past Medical History:  Diagnosis Date   Carpal tunnel syndrome of right wrist 10/25/2013   Diabetes mellitus without complication (HCC)    Hypertension    PCOS (polycystic ovarian syndrome)    no current med.    Medications include:   Current Outpatient Medications:    cetirizine (ZYRTEC) 10 MG chewable tablet, Chew 10 mg by mouth daily., Disp: , Rfl:    losartan -hydrochlorothiazide  (HYZAAR) 100-25 MG tablet, Take 1 tablet by mouth daily., Disp: 90 tablet, Rfl: 3   norethindrone (AYGESTIN) 5 MG tablet, Take 1 tablet by mouth every 30 (thirty) days. (Patient not taking: Reported on 09/09/2023), Disp: , Rfl:    Semaglutide , 2 MG/DOSE, (OZEMPIC , 2 MG/DOSE,) 8 MG/3ML SOPN, INJECT 2 MG SUBCUTANEOUSLY ONCE A WEEK AS DIRECTED. NEEDS OFFICE VISIT FOR FURTHER REFILLS., Disp: 3 mL, Rfl: 3   simvastatin  (ZOCOR ) 20 MG tablet, Take 1 tablet (20 mg total) by mouth at bedtime. (Patient not taking: Reported on 10/15/2023), Disp: 90 tablet, Rfl: 3  Labs noted:   Lab Results  Component Value Date   HGBA1C 6.9 01/29/2023   Lab Results  Component Value Date   CHOL 129 01/29/2023    HDL 32.90 (L) 01/29/2023   LDLCALC 79 01/29/2023   LDLDIRECT 93.0 06/15/2020   TRIG 85.0 01/29/2023   CHOLHDL 4 01/29/2023   BP Readings from Last 3 Encounters:  09/09/23 130/78  01/29/23 (!) 140/100  12/23/22 (!) 151/95   Wt Readings from Last 3 Encounters:  09/09/23 254 lb (115.2 kg)  01/29/23 252 lb (114.3 kg)  12/23/22 255 lb (115.7 kg)    There were no vitals taken for this visit. There is no height or weight on file to calculate BMI.     Individualized Plan for Diabetes Self-Management Training:   Learning Objective:  Patient will have a greater understanding of diabetes self-management. Patient education plan is to attend individual and/or group sessions per assessed needs and concerns.   Plan:   There are no Patient Instructions on file for this visit.  Expected Outcomes:     Education material provided: ADA - How to Thrive: A Guide for Your Journey with Diabetes, My Plate, Snack sheet, and Support group flyer  If problems or questions, patient to contact team via:  Phone  Future DSME appointment:

## 2023-12-05 ENCOUNTER — Ambulatory Visit: Admitting: Dietician

## 2023-12-05 DIAGNOSIS — E118 Type 2 diabetes mellitus with unspecified complications: Secondary | ICD-10-CM

## 2024-01-10 ENCOUNTER — Other Ambulatory Visit: Payer: Self-pay | Admitting: Internal Medicine

## 2024-01-13 ENCOUNTER — Other Ambulatory Visit (HOSPITAL_COMMUNITY): Payer: Self-pay

## 2024-01-13 ENCOUNTER — Telehealth: Payer: Self-pay

## 2024-01-13 NOTE — Telephone Encounter (Signed)
 Pharmacy Patient Advocate Encounter   Received notification from Onbase that prior authorization for Ozempic  8 is due for renewal.   Insurance verification completed.   The patient is insured through [facility].  Action: The current 28 day co-pay is, $24.99.  No PA needed at this time. This test claim was processed through [facility]- copay amounts may vary at other pharmacies due to pharmacy/plan contracts, or as the patient moves through the different stages of their insurance plan.   Renewal PA expires 01/12/25 [id number]

## 2024-01-14 ENCOUNTER — Other Ambulatory Visit (HOSPITAL_COMMUNITY): Payer: Self-pay

## 2024-01-17 ENCOUNTER — Other Ambulatory Visit: Payer: Self-pay

## 2024-01-17 ENCOUNTER — Emergency Department (HOSPITAL_BASED_OUTPATIENT_CLINIC_OR_DEPARTMENT_OTHER)

## 2024-01-17 ENCOUNTER — Emergency Department (HOSPITAL_BASED_OUTPATIENT_CLINIC_OR_DEPARTMENT_OTHER): Admission: EM | Admit: 2024-01-17 | Discharge: 2024-01-17 | Disposition: A | Discharge status: Home / Self Care

## 2024-01-17 ENCOUNTER — Encounter (HOSPITAL_BASED_OUTPATIENT_CLINIC_OR_DEPARTMENT_OTHER): Payer: Self-pay | Admitting: Emergency Medicine

## 2024-01-17 DIAGNOSIS — Y92410 Unspecified street and highway as the place of occurrence of the external cause: Secondary | ICD-10-CM | POA: Diagnosis not present

## 2024-01-17 DIAGNOSIS — M791 Myalgia, unspecified site: Secondary | ICD-10-CM | POA: Insufficient documentation

## 2024-01-17 DIAGNOSIS — Z79899 Other long term (current) drug therapy: Secondary | ICD-10-CM | POA: Diagnosis not present

## 2024-01-17 DIAGNOSIS — M7918 Myalgia, other site: Secondary | ICD-10-CM

## 2024-01-17 MED ORDER — HYDROCODONE-ACETAMINOPHEN 5-325 MG PO TABS: 1.0000 | ORAL_TABLET | ORAL | 0 refills | Status: DC | PRN
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
## 2024-01-17 NOTE — Discharge Instructions (Addendum)
 As we discussed, you will likely be more sore tomorrow. Take Norco for pain as needed for severe pain when not working or driving. Cool compresses to the sore areas for the 1st 48 hours, then warm compresses can be used.   Return to the ED with any new or concerning symptoms at any time.

## 2024-02-19 ENCOUNTER — Other Ambulatory Visit: Payer: Self-pay | Admitting: Internal Medicine

## 2024-02-20 ENCOUNTER — Telehealth: Payer: Self-pay | Admitting: Radiology

## 2024-02-20 NOTE — Telephone Encounter (Signed)
 This has been sent in as of today

## 2024-02-20 NOTE — Telephone Encounter (Signed)
 Copied from CRM #[id number]. Topic: Clinical - Medication Question >> Feb 20, 2024  1:02 PM Lauren C wrote: Reason for CRM: Pt only has 1 pill left for Losartan . Med refill requested yesterday. I let her know it can be 3 business days for refill.  She wants me to let nurses know that the refill is urgent because she is about out and it is Friday and she will be out of town for a while.  [employer] [address] [place] - [address] [place] [address] Phone: [phone] Fax: [phone]

## 2024-02-27 ENCOUNTER — Ambulatory Visit: Admitting: Internal Medicine

## 2024-03-02 ENCOUNTER — Encounter: Admitting: Internal Medicine

## 2024-03-19 ENCOUNTER — Encounter: Admitting: Internal Medicine

## 2024-04-01 ENCOUNTER — Ambulatory Visit: Payer: Self-pay
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
## 2024-04-08 ENCOUNTER — Ambulatory Visit: Payer: Self-pay

## 2024-04-08 NOTE — Telephone Encounter (Signed)
 FYI Only or Action Required?: FYI only for provider: appointment scheduled on 04/16/24.  Patient was last seen in primary care on 09/09/2023 by Norleen Lynwood ORN, MD.  Called Nurse Triage reporting No chief complaint on file..  Symptoms began n/a.  Interventions attempted: Other: n/a.  Symptoms are: n/a.  Triage Disposition: No disposition on file.  Patient/caregiver understands and will follow disposition?:   Patient calling in to cancel appointment for tomorrow and wants to reschedule her physical that is scheduled for 10/17. Denies new or worsening symptoms at this time.

## 2024-04-09 ENCOUNTER — Ambulatory Visit: Admitting: Internal Medicine

## 2024-04-09 NOTE — Telephone Encounter (Signed)
 Noted as I see she has been rescheduled

## 2024-04-12 ENCOUNTER — Encounter: Admitting: Internal Medicine

## 2024-04-16 ENCOUNTER — Ambulatory Visit (INDEPENDENT_AMBULATORY_CARE_PROVIDER_SITE_OTHER): Admitting: Internal Medicine

## 2024-04-16 VITALS — BP 142/88 | HR 89 | Temp 98.1°F | Ht 62.0 in | Wt 257.0 lb

## 2024-04-16 DIAGNOSIS — Z113 Encounter for screening for infections with a predominantly sexual mode of transmission: Secondary | ICD-10-CM | POA: Diagnosis not present

## 2024-04-16 DIAGNOSIS — Z7985 Long-term (current) use of injectable non-insulin antidiabetic drugs: Secondary | ICD-10-CM

## 2024-04-16 DIAGNOSIS — G8929 Other chronic pain: Secondary | ICD-10-CM | POA: Diagnosis not present

## 2024-04-16 DIAGNOSIS — E118 Type 2 diabetes mellitus with unspecified complications: Secondary | ICD-10-CM

## 2024-04-16 DIAGNOSIS — I10 Essential (primary) hypertension: Secondary | ICD-10-CM

## 2024-04-16 DIAGNOSIS — E785 Hyperlipidemia, unspecified: Secondary | ICD-10-CM

## 2024-04-16 DIAGNOSIS — M25512 Pain in left shoulder: Secondary | ICD-10-CM

## 2024-04-16 DIAGNOSIS — Z0000 Encounter for general adult medical examination without abnormal findings: Secondary | ICD-10-CM

## 2024-04-16 DIAGNOSIS — E1169 Type 2 diabetes mellitus with other specified complication: Secondary | ICD-10-CM | POA: Diagnosis not present

## 2024-04-16 DIAGNOSIS — J410 Simple chronic bronchitis: Secondary | ICD-10-CM

## 2024-04-16 DIAGNOSIS — Z0001 Encounter for general adult medical examination with abnormal findings: Secondary | ICD-10-CM

## 2024-04-16 DIAGNOSIS — F1020 Alcohol dependence, uncomplicated: Secondary | ICD-10-CM

## 2024-04-16 LAB — CBC
HCT: 40.5 % (ref 36.0–46.0)
Hemoglobin: 13.7 g/dL (ref 12.0–15.0)
MCHC: 33.9 g/dL (ref 30.0–36.0)
MCV: 91.4 fl (ref 78.0–100.0)
Platelets: 221 K/uL (ref 150.0–400.0)
RBC: 4.43 Mil/uL (ref 3.87–5.11)
RDW: 14.1 % (ref 11.5–15.5)
WBC: 5.9 K/uL (ref 4.0–10.5)

## 2024-04-16 LAB — MICROALBUMIN / CREATININE URINE RATIO
Creatinine,U: 78.6 mg/dL
Microalb Creat Ratio: 40.3 mg/g — ABNORMAL HIGH (ref 0.0–30.0)
Microalb, Ur: 3.2 mg/dL — ABNORMAL HIGH (ref 0.0–1.9)

## 2024-04-16 LAB — COMPREHENSIVE METABOLIC PANEL WITH GFR
ALT: 32 U/L (ref 0–35)
AST: 26 U/L (ref 0–37)
Albumin: 4.3 g/dL (ref 3.5–5.2)
Alkaline Phosphatase: 46 U/L (ref 39–117)
BUN: 12 mg/dL (ref 6–23)
CO2: 26 meq/L (ref 19–32)
Calcium: 9.1 mg/dL (ref 8.4–10.5)
Chloride: 103 meq/L (ref 96–112)
Creatinine, Ser: 0.73 mg/dL (ref 0.40–1.20)
GFR: 104.91 mL/min (ref >60.00)
Glucose, Bld: 124 mg/dL — ABNORMAL HIGH (ref 70–99)
Potassium: 3.7 meq/L (ref 3.5–5.1)
Sodium: 139 meq/L (ref 135–145)
Total Bilirubin: 0.4 mg/dL (ref 0.2–1.2)
Total Protein: 7.2 g/dL (ref 6.0–8.3)

## 2024-04-16 LAB — LIPID PANEL
Cholesterol: 135 mg/dL (ref 0–200)
HDL: 40.9 mg/dL (ref >39.00)
LDL Cholesterol: 66 mg/dL (ref 0–99)
NonHDL: 93.79
Total CHOL/HDL Ratio: 3
Triglycerides: 137 mg/dL (ref 0.0–149.0)
VLDL: 27.4 mg/dL (ref 0.0–40.0)

## 2024-04-16 LAB — HEMOGLOBIN A1C: Hgb A1c MFr Bld: 7.2 % — ABNORMAL HIGH (ref 4.6–6.5)

## 2024-04-16 MED ORDER — OZEMPIC (2 MG/DOSE) 8 MG/3ML ~~LOC~~ SOPN: 2.0000 mg | PEN_INJECTOR | SUBCUTANEOUS | 3 refills | Status: AC

## 2024-04-16 MED ORDER — LOSARTAN POTASSIUM-HCTZ 100-25 MG PO TABS: 1.0000 | ORAL_TABLET | Freq: Every day | ORAL | 3 refills | Status: AC

## 2024-04-16 NOTE — Assessment & Plan Note (Signed)
 Stable usage several shots daily with more use during the weekends. Counseled to reduce.

## 2024-04-16 NOTE — Assessment & Plan Note (Signed)
 Checking lipid panel and adjust as needed.
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
## 2024-04-16 NOTE — Assessment & Plan Note (Signed)
 Still smoking and wants to cut back.

## 2024-04-16 NOTE — Assessment & Plan Note (Signed)
 Overall mild improvement since August. Referral to sports medicine for treatment.

## 2024-04-16 NOTE — Assessment & Plan Note (Signed)
 BMI >40 counseled about diet and exercise to help.

## 2024-04-16 NOTE — Assessment & Plan Note (Signed)
 BP mildly elevated and due for refill has missed a few doses. Previously controlled will continue regimen.

## 2024-04-16 NOTE — Assessment & Plan Note (Signed)
 Flu shot declines. Pneumonia declines. Tetanus declines, pap smear up to date with gyn. Counseled about sun safety and mole surveillance. Counseled about the dangers of distracted driving. Given 10 year screening recommendations.

## 2024-04-16 NOTE — Assessment & Plan Note (Signed)
 Checking UACR, HgA1c and CMP and lipid panel. Adjust ozempic  2 mg weekly as needed on ARB.

## 2024-04-17 LAB — SYPHILIS: RPR W/REFLEX TO RPR TITER AND TREPONEMAL ANTIBODIES, TRADITIONAL SCREENING AND DIAGNOSIS ALGORITHM: RPR Ser Ql: NONREACTIVE

## 2024-04-17 LAB — HIV ANTIBODY (ROUTINE TESTING W REFLEX)
HIV 1&2 Ab, 4th Generation: NONREACTIVE
HIV FINAL INTERPRETATION: NEGATIVE

## 2024-04-19 ENCOUNTER — Ambulatory Visit: Payer: Self-pay | Admitting: Internal Medicine

## 2024-04-19 LAB — VITAMIN D 25 HYDROXY (VIT D DEFICIENCY, FRACTURES): VITD: 15.93 ng/mL — ABNORMAL LOW (ref 30.00–100.00)

## 2024-04-19 MED ORDER — VITAMIN D (ERGOCALCIFEROL) 1.25 MG (50000 UNIT) PO CAPS
50000.0000 [IU] | ORAL_CAPSULE | ORAL | 0 refills | Status: AC
Start: 2024-04-19 — End: ?

## 2024-04-20 LAB — GC/CHLAMYDIA PROBE AMP
Chlamydia trachomatis, NAA: NEGATIVE
Neisseria Gonorrhoeae by PCR: NEGATIVE
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
## 2024-04-30 ENCOUNTER — Ambulatory Visit: Admitting: Sports Medicine

## 2024-04-30 VITALS — BP 132/82 | HR 91 | Ht 62.0 in | Wt 256.0 lb

## 2024-04-30 DIAGNOSIS — G8929 Other chronic pain: Secondary | ICD-10-CM

## 2024-04-30 DIAGNOSIS — S4352XA Sprain of left acromioclavicular joint, initial encounter: Secondary | ICD-10-CM

## 2024-04-30 MED ORDER — MELOXICAM 15 MG PO TABS: ORAL_TABLET | ORAL | 0 refills | Status: AC

## 2024-04-30 NOTE — Patient Instructions (Signed)
-   Start meloxicam  15 mg daily x2 weeks.  If still having pain after 2 weeks, complete 3rd-week of NSAID. May use remaining NSAID as needed once daily for pain control.  Do not to use additional over-the-counter NSAIDs (ibuprofen , naproxen , Advil , Aleve , etc.) while taking prescription NSAIDs.  May use Tylenol  [phone] mg 2 to 3 times a day for breakthrough pain.  Voltaren  gel over areas of pain   Shoulder HEP   4-5 week follow up
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
## 2024-05-31 ENCOUNTER — Ambulatory Visit: Admitting: Sports Medicine

## 2024-06-01 ENCOUNTER — Other Ambulatory Visit: Payer: Self-pay | Admitting: Sports Medicine
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
## 2024-06-11 ENCOUNTER — Ambulatory Visit: Admitting: Sports Medicine

## 2024-06-11 VITALS — BP 148/80 | HR 105 | Ht 62.0 in | Wt 254.0 lb

## 2024-06-11 DIAGNOSIS — S4352XD Sprain of left acromioclavicular joint, subsequent encounter: Secondary | ICD-10-CM

## 2024-06-11 DIAGNOSIS — G8929 Other chronic pain: Secondary | ICD-10-CM

## 2024-06-11 DIAGNOSIS — M25512 Pain in left shoulder: Secondary | ICD-10-CM | POA: Diagnosis not present

## 2024-06-11 NOTE — Patient Instructions (Addendum)
-   Use Tylenol  500 to 1000 mg tablets 2-3 times a day for day-to-day pain relief  Rotator cuff HEP. MRI of left shoulder without contrast to [facility]. Follow up 1 week after MRI.

## 2024-06-30 ENCOUNTER — Other Ambulatory Visit

## 2024-07-07 ENCOUNTER — Other Ambulatory Visit
